# Patient Record
Sex: Male | Born: 1955 | Race: White | Hispanic: No | Marital: Married | Smoking: Never smoker
Health system: Southern US, Community
[De-identification: ages and names within clinical notes are randomized; demographics above are authoritative.]

## PROBLEM LIST (undated history)

## (undated) DIAGNOSIS — C801 Malignant (primary) neoplasm, unspecified: Secondary | ICD-10-CM

## (undated) DIAGNOSIS — E7800 Pure hypercholesterolemia, unspecified: Secondary | ICD-10-CM

## (undated) DIAGNOSIS — I10 Essential (primary) hypertension: Secondary | ICD-10-CM

## (undated) DIAGNOSIS — E669 Obesity, unspecified: Secondary | ICD-10-CM

## (undated) DIAGNOSIS — G4730 Sleep apnea, unspecified: Secondary | ICD-10-CM

## (undated) DIAGNOSIS — C8590 Non-Hodgkin lymphoma, unspecified, unspecified site: Secondary | ICD-10-CM

## (undated) HISTORY — PX: HIP SURGERY: SHX245

---

## 2017-07-29 ENCOUNTER — Emergency Department (HOSPITAL_BASED_OUTPATIENT_CLINIC_OR_DEPARTMENT_OTHER)
Admission: EM | Admit: 2017-07-29 | Discharge: 2017-07-29 | Disposition: A | Discharge status: Home / Self Care | Payer: BLUE CROSS/BLUE SHIELD | Attending: Emergency Medicine | Admitting: Emergency Medicine

## 2017-07-29 ENCOUNTER — Other Ambulatory Visit: Payer: Self-pay

## 2017-07-29 ENCOUNTER — Emergency Department (HOSPITAL_BASED_OUTPATIENT_CLINIC_OR_DEPARTMENT_OTHER): Payer: BLUE CROSS/BLUE SHIELD

## 2017-07-29 ENCOUNTER — Encounter (HOSPITAL_BASED_OUTPATIENT_CLINIC_OR_DEPARTMENT_OTHER): Payer: Self-pay | Admitting: *Deleted

## 2017-07-29 DIAGNOSIS — Y9339 Activity, other involving climbing, rappelling and jumping off: Secondary | ICD-10-CM | POA: Insufficient documentation

## 2017-07-29 DIAGNOSIS — Z79899 Other long term (current) drug therapy: Secondary | ICD-10-CM | POA: Diagnosis not present

## 2017-07-29 DIAGNOSIS — S63259A Unspecified dislocation of unspecified finger, initial encounter: Secondary | ICD-10-CM

## 2017-07-29 DIAGNOSIS — S62663A Nondisplaced fracture of distal phalanx of left middle finger, initial encounter for closed fracture: Secondary | ICD-10-CM | POA: Diagnosis not present

## 2017-07-29 DIAGNOSIS — Y929 Unspecified place or not applicable: Secondary | ICD-10-CM | POA: Insufficient documentation

## 2017-07-29 DIAGNOSIS — S62253A Displaced fracture of neck of first metacarpal bone, unspecified hand, initial encounter for closed fracture: Secondary | ICD-10-CM | POA: Diagnosis not present

## 2017-07-29 DIAGNOSIS — I10 Essential (primary) hypertension: Secondary | ICD-10-CM | POA: Diagnosis not present

## 2017-07-29 DIAGNOSIS — S6992XA Unspecified injury of left wrist, hand and finger(s), initial encounter: Secondary | ICD-10-CM | POA: Diagnosis present

## 2017-07-29 DIAGNOSIS — Y999 Unspecified external cause status: Secondary | ICD-10-CM | POA: Insufficient documentation

## 2017-07-29 DIAGNOSIS — Z859 Personal history of malignant neoplasm, unspecified: Secondary | ICD-10-CM | POA: Insufficient documentation

## 2017-07-29 DIAGNOSIS — W228XXA Striking against or struck by other objects, initial encounter: Secondary | ICD-10-CM | POA: Insufficient documentation

## 2017-07-29 HISTORY — DX: Malignant (primary) neoplasm, unspecified: C80.1

## 2017-07-29 HISTORY — DX: Essential (primary) hypertension: I10

## 2017-07-29 MED ORDER — BUPIVACAINE HCL 0.5 % IJ SOLN
INTRAMUSCULAR | Status: AC
  Administered 2017-07-29: 50 mL
  Filled 2017-07-29: qty 1

## 2017-07-29 MED ORDER — CEPHALEXIN 500 MG PO CAPS: 500.0000 mg | ORAL_CAPSULE | Freq: Four times a day (QID) | ORAL | 0 refills | Status: DC

## 2017-07-29 MED ORDER — HYDROCODONE-ACETAMINOPHEN 5-325 MG PO TABS: 1.0000 | ORAL_TABLET | ORAL | 0 refills | Status: DC | PRN

## 2017-07-29 MED ORDER — BUPIVACAINE HCL 0.5 % IJ SOLN
50.0000 mL | Freq: Once | INTRAMUSCULAR | Status: AC
  Administered 2017-07-29: 50 mL
  Filled 2017-07-29: qty 1

## 2017-07-29 MED FILL — HYDROCODON-APAP 5-325: 5-325 | 2 days supply | Qty: 6 | Fill #0

## 2017-07-29 MED FILL — CEPHALEXIN 500 MG CAPSULE: 500 | 5 days supply | Qty: 20 | Fill #0

## 2017-07-29 NOTE — ED Provider Notes (Addendum)
[facility]ERG[facility] DEPARTMENT Provider Note   CSN: [id number] Arrival date & time: 07/29/17  1445     History   Chief Complaint Chief Complaint  Patient presents with  . Fall  . Finger Injury    HPI Michael Wolf is a 62 y.o. male. CC:  Finger injury.  HPI:  62 year old male. 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 The metal railing latter was slick and he slipped. He does not know if he caught his finger on something on the way down or struck it against the ground. His left finger injury with deformity. Has laceration on the volar aspect some pain distally in the deformity of his IP joint. Struck his head has abrasion. No loss of consciousness. Not anticoagulated. No neck or back pain. No neurological symptoms. Not repetitive.  Past Medical History:  Diagnosis Date  . Cancer ([place])   . Hypertension     There are no active problems to display for this patient.   History reviewed. No pertinent surgical history.     Home Medications    Prior to Admission medications   Medication Sig Start Date End Date Taking? Authorizing Provider  Ezetimibe-Simvastatin (VYTORIN PO) Take by mouth.   Yes [provider]  TRIAMTERENE-HCTZ PO Take by mouth.   Yes [provider]    Family History No family history on file.  Social History Social History   Tobacco Use  . Smoking status: Never Smoker  . Smokeless tobacco: Never Used  Substance Use Topics  . Alcohol use: No    Frequency: Never  . Drug use: No     Allergies   Patient has no known allergies.   Review of Systems Review of Systems  Constitutional: Negative for appetite change, chills, diaphoresis, fatigue and fever.  HENT: Negative for mouth sores, sore throat and trouble swallowing.        Right temple abrasion  Eyes: Negative for visual disturbance.  Respiratory: Negative for cough, chest tightness, shortness of breath and wheezing.   Cardiovascular: Negative  for chest pain.  Gastrointestinal: Negative for abdominal distention, abdominal pain, diarrhea, nausea and vomiting.  Endocrine: Negative for polydipsia, polyphagia and polyuria.  Genitourinary: Negative for dysuria, frequency and hematuria.  Musculoskeletal: Negative for gait problem.       Finger injury  Skin: Negative for color change, pallor and rash.  Neurological: Negative for dizziness, syncope, light-headedness and headaches.  Hematological: Does not bruise/bleed easily.  Psychiatric/Behavioral: Negative for behavioral problems and confusion.     Physical Exam Updated Vital Signs BP 137/87   Pulse (!) 104   Temp 98.1 F (36.7 C) (Oral)   Resp 18   Ht 6' (1.829 m)   Wt (!) 145.2 kg (320 lb)   SpO2 95%   BMI 43.40 kg/m   Physical Exam  Constitutional: He is oriented to person, place, and time. He appears well-developed and well-nourished. No distress.  HENT:  Head: Normocephalic.  Eyes: Conjunctivae are normal. Pupils are equal, round, and reactive to light. No scleral icterus.  Neck: Normal range of motion. Neck supple. No thyromegaly present.  Cardiovascular: Normal rate and regular rhythm. Exam reveals no gallop and no friction rub.  No murmur heard. Pulmonary/Chest: Effort normal and breath sounds normal. No respiratory distress. He has no wheezes. He has no rales.  Abdominal: Soft. Bowel sounds are normal. He exhibits no distension. There is no tenderness. There is no rebound.  Musculoskeletal: Normal range of motion.  43mm laceration over volar PIP to LIF. FDS and FDP tendon function intact. Normal sensation Nail intact.  Neurological: He is alert and oriented to person, place, and time.  Skin: Skin is warm and dry. No rash noted.  Psychiatric: He has a normal mood and affect. His behavior is normal.     ED Treatments / Results  Labs (all labs ordered are listed, but only abnormal results are displayed) Labs Reviewed - No data to display  EKG  EKG  Interpretation None       Radiology Dg Finger Middle Left  Result Date: 07/29/2017 CLINICAL DATA:  Status post reduction of dislocation of the PIP joint of the right long finger. Initial encounter. EXAM: LEFT MIDDLE FINGER 2+V COMPARISON:  Plain films right long finger earlier today. FINDINGS: Dorsal dislocation of the PIP joint has been reduced. No new abnormality. 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 IMPRESSION: Successful reduction of dislocation of the PIP joint. No new abnormality. Electronically Signed   By: Inge Rise M.D.   On: 07/29/2017 16:21   Dg Finger Middle Left  Result Date: 07/29/2017 CLINICAL DATA:  Status post fall today landing on the left middle finger. EXAM: LEFT MIDDLE FINGER 2+V COMPARISON:  None in PACs FINDINGS: The patient has sustained dislocation at the PIP joint with the middle phalanx lying dorsal to the head of the proximal phalanx. There is shortening of the digit by approximately 8 mm. The DIP joint exhibits degenerative change but no definite acute fracture. However, there is a fracture through the tuft of the distal phalanx. There is mild degenerative change of the third MCP joint. A metallic foreign body projects along the ventral aspect of the soft tissues superficial to the head of the third metacarpal. IMPRESSION: Acute dislocation at the PIP joint with dorsal and ulnar displacement of the middle phalanx with respect to the head of the proximal phalanx. Comminuted and mildly distracted tuft fracture of the distal phalanx of the third finger. Degenerative changes of the third MCP joint and the DIP joint of the third finger. Electronically Signed   By: David  Martinique M.D.   On: 07/29/2017 15:16    Procedures Procedures (including critical care time)  Medications Ordered in ED Medications  bupivacaine (MARCAINE) 0.5 % (with pres) injection 50 mL (50 mLs  Infiltration Given 07/29/17 1544)     Initial Impression / Assessment and Plan / ED Course  I have reviewed the triage vital signs and the nursing notes.  Pertinent labs & imaging results that were available during my care of the patient were reviewed by me and considered in my medical decision making (see chart for details).   x-ray show distal phalanx fracture. He has dorsal PIP dislocation.  Finger was anesthetized with 0.5% Marcaine and reduced. Reduction of dislocation Date/Time: 4:31 PM Performed by: Lolita Patella Authorized by: Lolita Patella Consent: Verbal consent obtained. Risks and benefits: risks, benefits and alternatives were discussed Consent given by: patient Required items: required blood products, implants, devices, and special equipment available Time out: Immediately prior to procedure a "time out" was called to verify the correct patient, procedure, equipment, support staff and site/side marked as required.  Patient sedated: No  Vitals: Vital signs were monitored during sedation. Patient tolerance: Patient tolerated the procedure well with no immediate complications. Joint: LIF PIP Reduction technique: volar traction of distal, flexion.    Postreduction  x-ray show excellent reduction. No fractures of the distal pelvis fracture. We'll place him on antibiotics. Had surgical follow-up.  Patient winced finger under tap water for 5 minutes prior to reaction I had a re-irrigated with tap water afterwards. This is a 5 mm proximal medical laceration. Doubtful open joint as his tendons are intact. However will leave open as it is self approximating. Antibiotic ointment and gauze applied. Use antibiotic prophylaxis.   Final Clinical Impressions(s) / ED Diagnoses   Final diagnoses:  Dislocation of finger, initial encounter  Closed nondisplaced fracture of distal phalanx of left middle finger, initial encounter    ED Discharge Orders    None       Tanna Furry, MD 07/29/17 1631    Tanna Furry, MD 07/29/17 1635

## 2017-07-29 NOTE — ED Notes (Signed)
ED Provider at bedside. 

## 2017-07-29 NOTE — ED Notes (Signed)
ED Provider at bedside giving detailed discharge instructions.

## 2017-07-29 NOTE — Discharge Instructions (Signed)
Keep gauze dressing around laceration until healed. Change dressing daily Antibiotic as directed for 5 days. Call Dr. Fredna Dow, hand surgery, for recheck appointment.

## 2017-07-29 NOTE — ED Triage Notes (Signed)
He slipped on some oil and fell. Injury to his left middle finger. Deformity noted with puncture noted.

## 2019-08-05 ENCOUNTER — Ambulatory Visit: Payer: Medicare Other | Attending: Internal Medicine

## 2019-08-05 DIAGNOSIS — Z23 Encounter for immunization: Secondary | ICD-10-CM

## 2019-08-05 NOTE — Progress Notes (Signed)
   Covid-19 Vaccination Clinic  Name:  Michael Wolf    MRN: [id number] DOB: 1955/11/19  08/05/2019  Mr. [name] was observed post Covid-19 immunization for 15 minutes without incidence. He was provided with Vaccine Information Sheet and instruction to access the V-Safe system.   Mr. [name] was instructed to call 911 with any severe reactions post vaccine: Marland Kitchen Difficulty breathing  . Swelling of your face and throat  . A fast heartbeat  . A bad rash all over your body  . Dizziness and weakness    Immunizations Administered    Name Date Dose VIS Date Route   Pfizer COVID-19 Vaccine 08/05/2019  9:10 AM 0.3 mL 06/25/2019 Intramuscular   Manufacturer: [place]   Lot: [id number]   [place]: [id number]

## 2019-08-26 ENCOUNTER — Ambulatory Visit: Payer: Medicare Other | Attending: Internal Medicine

## 2019-08-26 DIAGNOSIS — Z23 Encounter for immunization: Secondary | ICD-10-CM | POA: Insufficient documentation

## 2019-08-26 NOTE — Progress Notes (Signed)
   Covid-19 Vaccination Clinic  Name:  Birney Salud    MRN: [id number] DOB: 1956/05/24  08/26/2019  Mr. Dupell was observed post Covid-19 immunization for 15 minutes without incidence. He was provided with Vaccine Information Sheet and instruction to access the V-Safe system.   Mr. Derousse was instructed to call 911 with any severe reactions post vaccine: Marland Kitchen Difficulty breathing  . Swelling of your face and throat  . A fast heartbeat  . A bad rash all over your body  . Dizziness and weakness    Immunizations Administered    Name Date Dose VIS Date Route   Pfizer COVID-19 Vaccine 08/26/2019  9:12 AM 0.3 mL 06/25/2019 Intramuscular   Manufacturer: [place]   Lot: [id number]   [place]: [id number]

## 2019-12-01 ENCOUNTER — Emergency Department (HOSPITAL_BASED_OUTPATIENT_CLINIC_OR_DEPARTMENT_OTHER): Payer: Medicare Other

## 2019-12-01 ENCOUNTER — Other Ambulatory Visit: Payer: Self-pay

## 2019-12-01 ENCOUNTER — Encounter (HOSPITAL_BASED_OUTPATIENT_CLINIC_OR_DEPARTMENT_OTHER): Payer: Self-pay | Admitting: Emergency Medicine

## 2019-12-01 ENCOUNTER — Emergency Department (HOSPITAL_BASED_OUTPATIENT_CLINIC_OR_DEPARTMENT_OTHER)
Admission: EM | Admit: 2019-12-01 | Discharge: 2019-12-01 | Disposition: A | Discharge status: Home / Self Care | Payer: Medicare Other | Attending: Emergency Medicine | Admitting: Emergency Medicine

## 2019-12-01 DIAGNOSIS — W309XXA Contact with unspecified agricultural machinery, initial encounter: Secondary | ICD-10-CM | POA: Diagnosis not present

## 2019-12-01 DIAGNOSIS — Y999 Unspecified external cause status: Secondary | ICD-10-CM | POA: Insufficient documentation

## 2019-12-01 DIAGNOSIS — S62524B Nondisplaced fracture of distal phalanx of right thumb, initial encounter for open fracture: Secondary | ICD-10-CM | POA: Insufficient documentation

## 2019-12-01 DIAGNOSIS — S62521B Displaced fracture of distal phalanx of right thumb, initial encounter for open fracture: Secondary | ICD-10-CM

## 2019-12-01 DIAGNOSIS — Y9389 Activity, other specified: Secondary | ICD-10-CM | POA: Insufficient documentation

## 2019-12-01 DIAGNOSIS — S6701XA Crushing injury of right thumb, initial encounter: Secondary | ICD-10-CM | POA: Diagnosis present

## 2019-12-01 DIAGNOSIS — S61111A Laceration without foreign body of right thumb with damage to nail, initial encounter: Secondary | ICD-10-CM | POA: Diagnosis not present

## 2019-12-01 DIAGNOSIS — Y9279 Other farm location as the place of occurrence of the external cause: Secondary | ICD-10-CM | POA: Insufficient documentation

## 2019-12-01 DIAGNOSIS — Z23 Encounter for immunization: Secondary | ICD-10-CM | POA: Insufficient documentation

## 2019-12-01 HISTORY — DX: Sleep apnea, unspecified: G47.30

## 2019-12-01 HISTORY — DX: Obesity, unspecified: E66.9

## 2019-12-01 HISTORY — DX: Pure hypercholesterolemia, unspecified: E78.00

## 2019-12-01 HISTORY — DX: Non-Hodgkin lymphoma, unspecified, unspecified site: C85.90

## 2019-12-01 MED ORDER — TETANUS-DIPHTH-ACELL PERTUSSIS 5-2.5-18.5 LF-MCG/0.5 IM SUSP
0.5000 mL | Freq: Once | INTRAMUSCULAR | Status: AC
  Administered 2019-12-01: 0.5 mL via INTRAMUSCULAR
  Filled 2019-12-01: qty 0.5

## 2019-12-01 MED ORDER — LIDOCAINE HCL (PF) 1 % IJ SOLN
INTRAMUSCULAR | Status: AC
  Filled 2019-12-01: qty 5

## 2019-12-01 MED ORDER — HYDROCODONE-ACETAMINOPHEN 5-325 MG PO TABS: 1.0000 | ORAL_TABLET | Freq: Four times a day (QID) | ORAL | 0 refills | Status: DC | PRN

## 2019-12-01 MED ORDER — CEFAZOLIN SODIUM-DEXTROSE 2-4 GM/100ML-% IV SOLN
2.0000 g | Freq: Once | INTRAVENOUS | Status: AC
  Administered 2019-12-01: 2 g via INTRAVENOUS
  Filled 2019-12-01: qty 100

## 2019-12-01 MED ORDER — HYDROMORPHONE HCL 1 MG/ML IJ SOLN
0.5000 mg | Freq: Once | INTRAMUSCULAR | Status: AC
Start: 2019-12-01 — End: 2019-12-01
  Administered 2019-12-01: 0.5 mg via INTRAVENOUS
  Filled 2019-12-01: qty 1

## 2019-12-01 MED ORDER — LIDOCAINE HCL (PF) 1 % IJ SOLN
5.0000 mL | Freq: Once | INTRAMUSCULAR | Status: AC
  Administered 2019-12-01: 5 mL
  Filled 2019-12-01: qty 5

## 2019-12-01 MED ORDER — CEPHALEXIN 500 MG PO CAPS: 500.0000 mg | ORAL_CAPSULE | Freq: Three times a day (TID) | ORAL | 0 refills | Status: AC

## 2019-12-01 MED ORDER — SODIUM CHLORIDE 0.9 % IV SOLN
INTRAVENOUS | Status: DC | PRN
  Administered 2019-12-01: 250 mL via INTRAVENOUS

## 2019-12-01 NOTE — ED Provider Notes (Signed)
[facility] EMERGENCY DEPARTMENT Provider Note   CSN: [id number] Arrival date & time: 12/01/19  1400     History Chief Complaint  Patient presents with  . Finger Injury    Michael Wolf is a 64 y.o. male history of high cholesterol, hypertension, obesity.  Previous history of non-Hodgkin's lymphoma currently in remission.  Patient presents today for injury of the right thumb just prior to arrival.  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  Reports pain was initially moderate intensity sharp constant nonradiating worsened with pressure, pain is gradually improved with time without intervention.  Bleeding was stopped right away with direct pressure.  He denies any other injuries today no head injury, loss of consciousness, neck pain, back pain, chest pain, abdominal pain, nausea/vomiting, recent illness or any additional concerns.  Patient reports Tdap is out of date. HPI     Past Medical History:  Diagnosis Date  . Cancer ([place])   . High cholesterol   . Hypertension   . Non Hodgkin's lymphoma ([place])   . Obesity   . Sleep apnea     There are no problems to display for this patient.   Past Surgical History:  Procedure Laterality Date  . HIP SURGERY         No family history on file.  Social History   Tobacco Use  . Smoking status: Never Smoker  . Smokeless tobacco: Never Used  Substance Use Topics  . Alcohol use: No  . Drug use: No    Home Medications Prior to Admission medications   Medication Sig Start Date End Date Taking? Authorizing Provider  cephALEXin (KEFLEX) 500 MG capsule Take 1 capsule (500 mg total) by mouth 3 (three) times daily for 5 days. 12/01/19 12/06/19  Nuala Alpha A, PA-C  Ezetimibe-Simvastatin (VYTORIN PO) Take by mouth.    [provider]  HYDROcodone-acetaminophen (NORCO/VICODIN) 5-325 MG tablet Take 1-2 tablets by mouth  every 6 (six) hours as needed for severe pain. 12/01/19   Hayden Rasmussen, MD  TRIAMTERENE-HCTZ PO Take by mouth.    [provider]    Allergies    Patient has no known allergies.  Review of Systems   Review of Systems Ten systems are reviewed and are negative for acute change except as noted in the HPI  Physical Exam Updated Vital Signs BP 120/72 (BP Location: Right Arm)   Pulse 78   Temp 98 F (36.7 C) (Oral)   Resp 20   Ht 6' (1.829 m)   Wt (!) 149.5 kg   SpO2 98%   BMI 44.69 kg/m   Physical Exam Constitutional:      General: He is not in acute distress.    Appearance: Normal appearance. He is well-developed. He is not ill-appearing or diaphoretic.  HENT:     Head: Normocephalic and atraumatic.     Right Ear: External ear normal.     Left Ear: External ear normal.     Nose: Nose normal.  Eyes:     General: Vision grossly intact. Gaze aligned appropriately.     Pupils: Pupils are equal, round, and reactive to light.  Neck:     Trachea: Trachea and phonation normal. No tracheal deviation.  Pulmonary:     Effort: Pulmonary effort is normal. No respiratory distress.  Abdominal:     General: There is no distension.     Palpations:  Abdomen is soft.     Tenderness: There is no abdominal tenderness. There is no guarding or rebound.  Musculoskeletal:        General: Normal range of motion.     Cervical back: Normal range of motion.     Comments: Right  hand: Severe laceration of the right thumb extending from the ulnar edge of the proximal thumbnail completely through the nailbed and extending down through the radial side and across halfway in the pad, bone visible proximally along with nail matrix, no obvious arterial bleeding.  Distal portion is angulated towards the ulnar side and inferiorly towards palm.  Sensation diminished but intact distally.  Otherwise fingers and hand appear normal, probe range of motion strength with all other movements including at the  wrist.  Strong equal radial pulses, compartments soft.  Skin:    General: Skin is warm and dry.  Neurological:     Mental Status: He is alert.     GCS: GCS eye subscore is 4. GCS verbal subscore is 5. GCS motor subscore is 6.     Comments: Speech is clear and goal oriented, follows commands Major Cranial nerves without deficit, no facial droop Moves extremities without ataxia, coordination intact  Psychiatric:        Behavior: Behavior normal.     ED Results / Procedures / Treatments   Labs (all labs ordered are listed, but only abnormal results are displayed) Labs Reviewed - No data to display  EKG None  Radiology DG Finger Thumb Right  Result Date: 12/01/2019 CLINICAL DATA:  Laceration of the right thumb EXAM: RIGHT THUMB 2+V COMPARISON:  None. FINDINGS: Laceration of the dorsal aspect of the right thumb. Comminuted fracture through the tuft of the first distal phalanx. Moderate osteoarthritis of the first CMC joint, first MCP joint and first IP joint. No other fracture. No dislocation. IMPRESSION: Laceration of the dorsal aspect of the right thumb. Comminuted fracture through the tuft of the first distal phalanx. Electronically Signed   By: Kathreen Devoid   On: 12/01/2019 14:58    Procedures .Marland KitchenLaceration Repair  Date/Time: 12/01/2019 5:59 PM Performed by: Deliah Boston, PA-C Authorized by: Deliah Boston, PA-C   Consent:    Consent obtained:  Verbal   Consent given by:  Patient   Risks discussed:  Infection, need for additional repair, nerve damage, poor wound healing, poor cosmetic result, pain, retained foreign body, tendon damage and vascular damage Anesthesia (see MAR for exact dosages):    Anesthesia method:  Nerve block   Block location:  Right thumb   Block needle gauge:  25 G   Block anesthetic:  Lidocaine 1% w/o epi   Block technique:  Digital   Block injection procedure:  Anatomic landmarks identified, introduced needle, incremental injection, negative  aspiration for blood and anatomic landmarks palpated   Block outcome:  Anesthesia achieved Laceration details:    Location:  Finger   Finger location:  R thumb   Length (cm):  3.5   Depth (mm):  10 Repair type:    Repair type:  Intermediate Pre-procedure details:    Preparation:  Patient was prepped and draped in usual sterile fashion and imaging obtained to evaluate for foreign bodies Exploration:    Hemostasis achieved with:  Direct pressure   Wound exploration: wound explored through full range of motion and entire depth of wound probed and visualized     Wound extent: underlying fracture     Wound extent: no foreign bodies/material noted  Contaminated: no   Treatment:    Area cleansed with:  Saline, Shur-Clens and Betadine   Amount of cleaning:  Extensive   Irrigation solution:  Sterile saline   Irrigation volume:  1L   Irrigation method:  Pressure wash Skin repair:    Repair method:  Sutures   Suture size:  3-0   Suture material:  Prolene   Suture technique:  Simple interrupted   Number of sutures:  3 Post-procedure details:    Dressing:  Non-adherent dressing and sterile dressing   Patient tolerance of procedure:  Tolerated well, no immediate complications Comments:     Nonstick gauze, splint and dressing applied by nursing staff.   (including critical care time)  Medications Ordered in ED Medications  0.9 %  sodium chloride infusion (250 mLs Intravenous New Bag/Given 12/01/19 1654)  ceFAZolin (ANCEF) IVPB 2g/100 mL premix (0 g Intravenous Stopped 12/01/19 1804)  Tdap (BOOSTRIX) injection 0.5 mL (0.5 mLs Intramuscular Given 12/01/19 1634)  HYDROmorphone (DILAUDID) injection 0.5 mg (0.5 mg Intravenous Given 12/01/19 1649)  lidocaine (PF) (XYLOCAINE) 1 % injection 5 mL (5 mLs Infiltration Given by Other 12/01/19 1656)  lidocaine (PF) (XYLOCAINE) 1 % injection (  Given 12/01/19 1805)  lidocaine (PF) (XYLOCAINE) 1 % injection (  Given 12/01/19 [id number])    ED Course  I have  reviewed the triage vital signs and the nursing notes.  Pertinent labs & imaging results that were available during my care of the patient were reviewed by me and considered in my medical decision making (see chart for details).  Clinical Course as of Nov 30 1805  Wed Dec 01, 2019  1632 Dr. Caralyn Guile   [BM]  3332 64 year old male here with a crush injury to right thumb.  He has laceration through the nail and nailbed.  X-rays show a fracture.  Reviewed with hand surgery on-call who recommends some tach suturing and splint.  Antibiotics.  Follow-up in the office.   [MB]    Clinical Course User Index [BM] Deliah Boston, PA-C [MB] Hayden Rasmussen, MD   MDM Rules/Calculators/A&P                     64 year old male presents with right thumb injury that occurred just prior to arrival and got caught in some farming equipment.  Bleeding controlled with direct pressure.  X-ray obtained in triage below.  DG Right Thumb:  IMPRESSION:  Laceration of the dorsal aspect of the right thumb. Comminuted  fracture through the tuft of the first distal phalanx  I have personally reviewed patient's thumb x-ray and agree with radiologist interpretation. - Patient has an open fracture, 2 g Ancef ordered.  Dilaudid ordered for pain.  Tdap updated.  Consult placed to hand surgery to discuss possible transfer. - 4:32 PM: I discussed the case with hand surgeon Dr. Caralyn Guile, Dr. Caralyn Guile advises that we tacked down patient's thumb and place it in a splint and sent him to the office tomorrow for further evaluation. - I advised patient of hand surgery recommendations and he is agreeable.  Laceration was repaired as above.  I was able to use 3 sutures to tack down the thumb, the thumb had been angulated towards the ulnar side, after repair it is more aligned than prior still has obvious bony deformity present.  As per hand surgeon recommendations nonstick gauze was applied and patient was placed in splint.  He  reports this was comfortable.  He will call the hand surgeon Dr.  Ortmann tomorrow morning to schedule a follow-up appointment.  Patient was prescribed Keflex for infection prophylaxis.  Additionally 6 pills Norco were prescribed for acute pain associated with open fracture, patient states understanding of narcotic precautions and has no questions, his wife is here to drive home.  Norco was prescribed by Dr. Melina Copa as my Imprivata is malfunctioning.  At this time there does not appear to be any evidence of an acute emergency medical condition and the patient appears stable for discharge with appropriate outpatient follow up. Diagnosis was discussed with patient who verbalizes understanding of care plan and is agreeable to discharge. I have discussed return precautions with patient and wife who verbalizes understanding. Patient encouraged see Dr. Caralyn Guile tomorrow morning. All questions answered.  Patient was seen and evaluated by Dr. Melina Copa during this visit who agrees with repair, discharge and and surgery recommendations.  Note: Portions of this report may have been transcribed using voice recognition software. Every effort was made to ensure accuracy; however, inadvertent computerized transcription errors may still be present. Final Clinical Impression(s) / ED Diagnoses Final diagnoses:  Open fracture of tuft of distal phalanx of right thumb    Rx / DC Orders ED Discharge Orders         Ordered    cephALEXin (KEFLEX) 500 MG capsule  3 times daily     12/01/19 1749    HYDROcodone-acetaminophen (NORCO/VICODIN) 5-325 MG tablet  Every 6 hours PRN     12/01/19 1750           Gari Crown 12/01/19 1808    Hayden Rasmussen, MD 12/02/19 [phone]

## 2019-12-01 NOTE — Discharge Instructions (Addendum)
At this time there does not appear to be the presence of an emergent medical condition, however there is always the potential for conditions to change. Please read and follow the below instructions.  Please return to the Emergency Department immediately for any new or worsening symptoms. Please be sure to follow up with your Primary Care Provider within one week regarding your visit today; please call their office to schedule an appointment even if you are feeling better for a follow-up visit. You may take the medication Norco (Hydrocodone/Acetaminophen) as prescribed to help with severe pain.  This medication will make you drowsy so do not drive, drink alcohol, take other sedating medications or perform any dangerous activities such as driving after taking Norco. Norco contains Tylenol (acetaminophen) so do not take any other Tylenol-containing products with Norco. Please take your antibiotic Keflex as prescribed to help avoid infection. Please drink enough water to avoid dehydration and get plenty of rest. Call the hand surgeon Dr. Caralyn Guile first thing tomorrow morning to schedule a follow-up appointment.  He would like to see you in his office tomorrow, your address and his phone number are both on your discharge paperwork.  You will need to see Dr. Caralyn Guile for surgery.  Do not remove your nonstick gauze or your splint until you are seen by Dr. Caralyn Guile tomorrow.  Get help right away if: Your finger gets numb or turns blue. You develop fever or chills You have any new/concerning or worsening of symptoms.  Please read the additional information packets attached to your discharge summary.  Do not take your medicine if  develop an itchy rash, swelling in your mouth or lips, or difficulty breathing; call 911 and seek immediate emergency medical attention if this occurs.  Note: Portions of this text may have been transcribed using voice recognition software. Every effort was made to ensure accuracy;  however, inadvertent computerized transcription errors may still be present.

## 2019-12-01 NOTE — ED Triage Notes (Addendum)
Deep laceration to right thumb on piece of equipment just PTA.  No UDS needed. Pt works for himself.

## 2019-12-01 NOTE — ED Notes (Signed)
MD aware  Of xray results

## 2020-03-03 ENCOUNTER — Encounter (HOSPITAL_BASED_OUTPATIENT_CLINIC_OR_DEPARTMENT_OTHER): Payer: Self-pay

## 2020-03-03 ENCOUNTER — Other Ambulatory Visit: Payer: Self-pay

## 2020-03-03 ENCOUNTER — Emergency Department (HOSPITAL_BASED_OUTPATIENT_CLINIC_OR_DEPARTMENT_OTHER): Payer: Medicare Other

## 2020-03-03 ENCOUNTER — Inpatient Hospital Stay (HOSPITAL_BASED_OUTPATIENT_CLINIC_OR_DEPARTMENT_OTHER)
Admission: EM | Admit: 2020-03-03 | Discharge: 2020-03-09 | DRG: 177 | Disposition: A | Discharge status: Home Health | Payer: Medicare Other | Attending: Internal Medicine | Admitting: Internal Medicine

## 2020-03-03 DIAGNOSIS — U071 COVID-19: Principal | ICD-10-CM | POA: Diagnosis present

## 2020-03-03 DIAGNOSIS — E871 Hypo-osmolality and hyponatremia: Secondary | ICD-10-CM | POA: Diagnosis present

## 2020-03-03 DIAGNOSIS — E860 Dehydration: Secondary | ICD-10-CM | POA: Diagnosis present

## 2020-03-03 DIAGNOSIS — E7800 Pure hypercholesterolemia, unspecified: Secondary | ICD-10-CM | POA: Diagnosis present

## 2020-03-03 DIAGNOSIS — R0602 Shortness of breath: Secondary | ICD-10-CM

## 2020-03-03 DIAGNOSIS — Z79899 Other long term (current) drug therapy: Secondary | ICD-10-CM

## 2020-03-03 DIAGNOSIS — J9600 Acute respiratory failure, unspecified whether with hypoxia or hypercapnia: Secondary | ICD-10-CM | POA: Diagnosis not present

## 2020-03-03 DIAGNOSIS — J189 Pneumonia, unspecified organism: Secondary | ICD-10-CM

## 2020-03-03 DIAGNOSIS — I959 Hypotension, unspecified: Secondary | ICD-10-CM | POA: Diagnosis present

## 2020-03-03 DIAGNOSIS — E785 Hyperlipidemia, unspecified: Secondary | ICD-10-CM | POA: Diagnosis present

## 2020-03-03 DIAGNOSIS — C8590 Non-Hodgkin lymphoma, unspecified, unspecified site: Secondary | ICD-10-CM | POA: Diagnosis present

## 2020-03-03 DIAGNOSIS — E1165 Type 2 diabetes mellitus with hyperglycemia: Secondary | ICD-10-CM | POA: Diagnosis not present

## 2020-03-03 DIAGNOSIS — J9601 Acute respiratory failure with hypoxia: Secondary | ICD-10-CM | POA: Diagnosis present

## 2020-03-03 DIAGNOSIS — Z6841 Body Mass Index (BMI) 40.0 and over, adult: Secondary | ICD-10-CM

## 2020-03-03 DIAGNOSIS — Y92239 Unspecified place in hospital as the place of occurrence of the external cause: Secondary | ICD-10-CM | POA: Diagnosis not present

## 2020-03-03 DIAGNOSIS — T380X5A Adverse effect of glucocorticoids and synthetic analogues, initial encounter: Secondary | ICD-10-CM | POA: Diagnosis not present

## 2020-03-03 DIAGNOSIS — G4730 Sleep apnea, unspecified: Secondary | ICD-10-CM | POA: Diagnosis present

## 2020-03-03 DIAGNOSIS — E876 Hypokalemia: Secondary | ICD-10-CM | POA: Diagnosis present

## 2020-03-03 DIAGNOSIS — J1282 Pneumonia due to coronavirus disease 2019: Secondary | ICD-10-CM | POA: Diagnosis present

## 2020-03-03 DIAGNOSIS — I10 Essential (primary) hypertension: Secondary | ICD-10-CM | POA: Diagnosis present

## 2020-03-03 LAB — COMPREHENSIVE METABOLIC PANEL
ALT: 65 U/L — ABNORMAL HIGH (ref 0–44)
AST: 97 U/L — ABNORMAL HIGH (ref 15–41)
Albumin: 2.9 g/dL — ABNORMAL LOW (ref 3.5–5.0)
Alkaline Phosphatase: 56 U/L (ref 38–126)
Anion gap: 12 (ref 5–15)
BUN: 17 mg/dL (ref 8–23)
CO2: 25 mmol/L (ref 22–32)
Calcium: 7.9 mg/dL — ABNORMAL LOW (ref 8.9–10.3)
Chloride: 96 mmol/L — ABNORMAL LOW (ref 98–111)
Creatinine, Ser: 1.11 mg/dL (ref 0.61–1.24)
GFR calc Af Amer: >60 mL/min (ref >60)
GFR calc non Af Amer: >60 mL/min (ref >60)
Glucose, Bld: 111 mg/dL — ABNORMAL HIGH (ref 70–99)
Potassium: 3.3 mmol/L — ABNORMAL LOW (ref 3.5–5.1)
Sodium: 133 mmol/L — ABNORMAL LOW (ref 135–145)
Total Bilirubin: 0.5 mg/dL (ref 0.3–1.2)
Total Protein: 6.4 g/dL — ABNORMAL LOW (ref 6.5–8.1)

## 2020-03-03 LAB — FIBRINOGEN: Fibrinogen: 661 mg/dL — ABNORMAL HIGH (ref 210–475)

## 2020-03-03 LAB — TROPONIN I (HIGH SENSITIVITY): Troponin I (High Sensitivity): 15 ng/L (ref <18)

## 2020-03-03 LAB — SARS CORONAVIRUS 2 BY RT PCR (HOSPITAL ORDER, PERFORMED IN ~~LOC~~ HOSPITAL LAB): SARS Coronavirus 2: POSITIVE — AB

## 2020-03-03 LAB — CBC WITH DIFFERENTIAL/PLATELET
Abs Immature Granulocytes: 0.03 10*3/uL (ref 0.00–0.07)
Basophils Absolute: 0 10*3/uL (ref 0.0–0.1)
Basophils Relative: 0 %
Eosinophils Absolute: 0 10*3/uL (ref 0.0–0.5)
Eosinophils Relative: 0 %
HCT: 41.7 % (ref 39.0–52.0)
Hemoglobin: 13.7 g/dL (ref 13.0–17.0)
Immature Granulocytes: 1 %
Lymphocytes Relative: 4 %
Lymphs Abs: 0.2 10*3/uL — ABNORMAL LOW (ref 0.7–4.0)
MCH: 29.3 pg (ref 26.0–34.0)
MCHC: 32.9 g/dL (ref 30.0–36.0)
MCV: 89.1 fL (ref 80.0–100.0)
Monocytes Absolute: 0.4 10*3/uL (ref 0.1–1.0)
Monocytes Relative: 9 %
Neutro Abs: 3.5 10*3/uL (ref 1.7–7.7)
Neutrophils Relative %: 86 %
Platelets: 266 10*3/uL (ref 150–400)
RBC: 4.68 MIL/uL (ref 4.22–5.81)
RDW: 14.6 % (ref 11.5–15.5)
WBC: 4.1 10*3/uL (ref 4.0–10.5)
nRBC: 0 % (ref 0.0–0.2)

## 2020-03-03 LAB — LACTATE DEHYDROGENASE: LDH: 519 U/L — ABNORMAL HIGH (ref 98–192)

## 2020-03-03 LAB — LACTIC ACID, PLASMA: Lactic Acid, Venous: 1.7 mmol/L (ref 0.5–1.9)

## 2020-03-03 LAB — FERRITIN: Ferritin: 1000 ng/mL — ABNORMAL HIGH (ref 24–336)

## 2020-03-03 LAB — BRAIN NATRIURETIC PEPTIDE: B Natriuretic Peptide: 34.7 pg/mL (ref 0.0–100.0)

## 2020-03-03 LAB — TRIGLYCERIDES: Triglycerides: 129 mg/dL (ref <150)

## 2020-03-03 LAB — PROCALCITONIN: Procalcitonin: <0.1 ng/mL

## 2020-03-03 LAB — C-REACTIVE PROTEIN: CRP: 11 mg/dL — ABNORMAL HIGH (ref <1.0)

## 2020-03-03 LAB — D-DIMER, QUANTITATIVE: D-Dimer, Quant: 1.04 ug/mL-FEU — ABNORMAL HIGH (ref 0.00–0.50)

## 2020-03-03 MED ORDER — ONDANSETRON HCL 4 MG/2ML IJ SOLN: 4.0000 mg | Freq: Four times a day (QID) | INTRAMUSCULAR | Status: DC | PRN

## 2020-03-03 MED ORDER — ONDANSETRON HCL 4 MG PO TABS
4.0000 mg | ORAL_TABLET | Freq: Four times a day (QID) | ORAL | Status: DC | PRN
  Filled 2020-03-03: qty 1

## 2020-03-03 MED ORDER — ACETAMINOPHEN 325 MG PO TABS: 650.0000 mg | ORAL_TABLET | Freq: Four times a day (QID) | ORAL | Status: DC | PRN

## 2020-03-03 MED ORDER — TOCILIZUMAB 400 MG/20ML IV SOLN
800.0000 mg | Freq: Once | INTRAVENOUS | Status: DC
  Filled 2020-03-03: qty 40

## 2020-03-03 MED ORDER — BARICITINIB 2 MG PO TABS
4.0000 mg | ORAL_TABLET | Freq: Every day | ORAL | Status: DC
  Administered 2020-03-03 – 2020-03-09 (×7): 4 mg via ORAL
  Filled 2020-03-03 (×8): qty 2

## 2020-03-03 MED ORDER — ENOXAPARIN SODIUM 40 MG/0.4ML ~~LOC~~ SOLN
40.0000 mg | SUBCUTANEOUS | Status: DC
  Administered 2020-03-04: 40 mg via SUBCUTANEOUS
  Filled 2020-03-03: qty 0.4

## 2020-03-03 MED ORDER — DEXAMETHASONE SODIUM PHOSPHATE 10 MG/ML IJ SOLN
6.0000 mg | Freq: Once | INTRAMUSCULAR | Status: AC
  Administered 2020-03-03: 6 mg via INTRAVENOUS
  Filled 2020-03-03: qty 1

## 2020-03-03 MED ORDER — ENOXAPARIN SODIUM 40 MG/0.4ML ~~LOC~~ SOLN
SUBCUTANEOUS | Status: AC
  Administered 2020-03-03: 40 mg via SUBCUTANEOUS
  Filled 2020-03-03: qty 0.4

## 2020-03-03 MED ORDER — SODIUM CHLORIDE 0.9 % IV SOLN
100.0000 mg | Freq: Every day | INTRAVENOUS | Status: AC
  Administered 2020-03-04 – 2020-03-07 (×4): 100 mg via INTRAVENOUS
  Filled 2020-03-03 (×4): qty 20

## 2020-03-03 MED ORDER — DEXAMETHASONE SODIUM PHOSPHATE 10 MG/ML IJ SOLN: 6.0000 mg | INTRAMUSCULAR | Status: DC

## 2020-03-03 MED ORDER — SODIUM CHLORIDE 0.9 % IV SOLN
100.0000 mg | INTRAVENOUS | Status: AC
  Administered 2020-03-03: 100 mg via INTRAVENOUS
  Filled 2020-03-03 (×2): qty 20

## 2020-03-03 MED ORDER — MAGNESIUM HYDROXIDE 400 MG/5ML PO SUSP
30.0000 mL | Freq: Every day | ORAL | Status: DC | PRN
  Filled 2020-03-03 (×2): qty 30

## 2020-03-03 NOTE — ED Triage Notes (Signed)
Per EMS:  Pt tested positive for Covid 2 weeks ago.  Pt started SOB worsening for last two days.  85% on RA on arrival for EMS

## 2020-03-03 NOTE — ED Notes (Signed)
Lovenox has been given for night time dose 03/03/2020

## 2020-03-03 NOTE — ED Notes (Signed)
RN gave Pt. Remdesivir 100mg  ... x two.   Both have infused and flushed with NS.  Pt. Is resting at this time with no distress noted.

## 2020-03-03 NOTE — ED Notes (Signed)
Pt placed on 10L humidified O2 via large bore N/C.

## 2020-03-03 NOTE — ED Notes (Signed)
EDP said the HIV and Save tubes ok to draw with 5am blood draw

## 2020-03-03 NOTE — ED Notes (Signed)
Pt's wife is Zane Pellecchia. [phone]

## 2020-03-03 NOTE — ED Provider Notes (Addendum)
[facility] EMERGENCY DEPARTMENT Provider Note   CSN: [id number] Arrival date & time: 03/03/20  1449     History Chief Complaint  Patient presents with  . Shortness of Breath    Michael Wolf is a 64 y.o. male.  Presents to ER with concern for shortness of breath.  Patient reports he was diagnosed with COVID-19 2 weeks ago[facility].  Coworker and family member also with COVID-19.  Initially had fevers and night sweats, but these have stopped.  Has had generalized fatigue, no vomiting, no chest pain.  Shortness of breath started approximately 1 week ago, has steadily been worsening over this timeframe.  Currently his chief complaint is shortness of breath.  Denies any other acute complaints at this time.  Patient has past medical history of non-Hodgkin's lymphoma, not currently on any therapy.  Last on treatment in 2018.  Received both doses of Pfizer vaccine in February.  HPI     Past Medical History:  Diagnosis Date  . Cancer ([place])   . High cholesterol   . Hypertension   . Non Hodgkin's lymphoma ([place])   . Obesity   . Sleep apnea     There are no problems to display for this patient.   Past Surgical History:  Procedure Laterality Date  . HIP SURGERY         No family history on file.  Social History   Tobacco Use  . Smoking status: Never Smoker  . Smokeless tobacco: Never Used  Substance Use Topics  . Alcohol use: No  . Drug use: No    Home Medications Prior to Admission medications   Medication Sig Start Date End Date Taking? Authorizing Provider  Ezetimibe-Simvastatin (VYTORIN PO) Take by mouth.    [provider]  HYDROcodone-acetaminophen (NORCO/VICODIN) 5-325 MG tablet Take 1-2 tablets by mouth every 6 (six) hours as needed for severe pain. 12/01/19   Hayden Rasmussen, MD  TRIAMTERENE-HCTZ PO Take by mouth.    [provider]    Allergies    Patient has no known allergies.  Review of Systems   Review of Systems    Constitutional: Positive for chills, fatigue and fever.  HENT: Negative for ear pain and sore throat.   Eyes: Negative for pain and visual disturbance.  Respiratory: Positive for cough and shortness of breath.   Cardiovascular: Negative for chest pain and palpitations.  Gastrointestinal: Negative for abdominal pain and vomiting.  Genitourinary: Negative for dysuria and hematuria.  Musculoskeletal: Negative for arthralgias and back pain.  Skin: Negative for color change and rash.  Neurological: Negative for seizures and syncope.  All other systems reviewed and are negative.   Physical Exam Updated Vital Signs BP (!) 105/56   Pulse 94   Temp 99.1 F (37.3 C) (Oral)   Resp (!) 23   Ht 6' (1.829 m)   Wt 136.1 kg   SpO2 (!) 87%   BMI 40.69 kg/m   Physical Exam Vitals and nursing note reviewed.  Constitutional:      Appearance: He is well-developed.     Comments: Mild tachypnea, no respiratory distress  HENT:     Head: Normocephalic and atraumatic.  Eyes:     Conjunctiva/sclera: Conjunctivae normal.  Cardiovascular:     Rate and Rhythm: Normal rate and regular rhythm.     Heart sounds: No murmur heard.   Pulmonary:     Comments: Mild tachypnea, no respiratory distress, speaks in full sentences, mild crackles noted at bases Abdominal:  Palpations: Abdomen is soft.     Tenderness: There is no abdominal tenderness.  Musculoskeletal:     Cervical back: Neck supple.     Right lower leg: No edema.     Left lower leg: No edema.  Skin:    General: Skin is warm and dry.  Neurological:     General: No focal deficit present.     Mental Status: He is alert and oriented to person, place, and time.     ED Results / Procedures / Treatments   Labs (all labs ordered are listed, but only abnormal results are displayed) Labs Reviewed  CBC WITH DIFFERENTIAL/PLATELET - Abnormal; Notable for the following components:      Result Value   Lymphs Abs 0.2 (*)    All other  components within normal limits  COMPREHENSIVE METABOLIC PANEL - Abnormal; Notable for the following components:   Sodium 133 (*)    Potassium 3.3 (*)    Chloride 96 (*)    Glucose, Bld 111 (*)    Calcium 7.9 (*)    Total Protein 6.4 (*)    Albumin 2.9 (*)    AST 97 (*)    ALT 65 (*)    All other components within normal limits  D-DIMER, QUANTITATIVE (NOT AT [facility]) - Abnormal; Notable for the following components:   D-Dimer, Quant 1.04 (*)    All other components within normal limits  SARS CORONAVIRUS 2 BY RT PCR (HOSPITAL ORDER, [facility])  CULTURE, BLOOD (ROUTINE X 2)  CULTURE, BLOOD (ROUTINE X 2)  LACTIC ACID, PLASMA  BRAIN NATRIURETIC PEPTIDE  LACTIC ACID, PLASMA  PROCALCITONIN  LACTATE DEHYDROGENASE  FERRITIN  TRIGLYCERIDES  FIBRINOGEN  C-REACTIVE PROTEIN  TROPONIN I (HIGH SENSITIVITY)    EKG EKG Interpretation  Date/Time:  Friday March 03 2020 15:08:33 EDT Ventricular Rate:  92 PR Interval:    QRS Duration: 123 QT Interval:  391 QTC Calculation: 484 R Axis:   56 Text Interpretation: Sinus rhythm IVCD, consider atypical RBBB Baseline wander in lead(s) V1 Confirmed by Madalyn Rob [phone]) on 03/03/2020 4:23:41 PM   Radiology DG Chest [place] 1 View  Result Date: 03/03/2020 CLINICAL DATA:  COVID-19 diagnosed 2 weeks ago, increasing shortness of breath, hypoxemia EXAM: PORTABLE CHEST 1 VIEW COMPARISON:  None. FINDINGS: Single frontal view of the chest demonstrates an unremarkable cardiac silhouette. Diffuse interstitial prominence is seen, with faint ground-glass opacities within the mid and lower lung zones. No effusion or pneumothorax. No acute bony abnormality. IMPRESSION: 1. Interstitial prominence with bilateral ground-glass airspace disease. Differential includes multifocal atypical pneumonia versus edema. Electronically Signed   By: Randa Ngo M.D.   On: 03/03/2020 15:43    Procedures .Critical Care Performed by: Lucrezia Starch, MD Authorized by: Lucrezia Starch, MD   Critical care provider statement:    Critical care time (minutes):  45   Critical care was necessary to treat or prevent imminent or life-threatening deterioration of the following conditions:  Respiratory failure   Critical care was time spent personally by me on the following activities:  Discussions with consultants, evaluation of patient's response to treatment, examination of patient, ordering and performing treatments and interventions, ordering and review of laboratory studies, ordering and review of radiographic studies, pulse oximetry, re-evaluation of patient's condition, obtaining history from patient or surrogate and review of old charts   (including critical care time)  Medications Ordered in ED Medications  dexamethasone (DECADRON) injection 6 mg (6 mg Intravenous Given  03/03/20 1633)    ED Course  I have reviewed the triage vital signs and the nursing notes.  Pertinent labs & imaging results that were available during my care of the patient were reviewed by me and considered in my medical decision making (see chart for details).    MDM Rules/Calculators/A&P                          64 year old gentleman who presented to ER with concern for shortness of breath in setting of recently diagnosed COVID-19.  On exam, patient noted to have some tachypnea but not in respiratory distress.  Work-up notable for multifocal atypical pneumonia on CXR.  Labs grossly stable, D-dimer is minimally elevated which I suspect is related to COVID-19 and I suspect his current respiratory failure is from Covid and less likely acute pulmonary embolism.  EKG without ischemic change, troponin normal limits, doubt ACS.  Patient is currently on 10 L nasal cannula and tolerating well.  Will need hospitalist admission for further management.  Started Decadron.   ADDENDUM -reviewed case in detail with admitting doctor Dr. Ree Kida, hospitalist.  She  recommends initiating remdesivir and Actemra for patient in addition to steroids.  Will review with patient and obtained informed consent.  In case patient is boarding for prolonged period of time at [facility], placed routine admission orders for Covid including VTE prophylaxis, scheduled steroids and remdesivir and placed order for Actemra.    Michael Wolf was evaluated in Emergency Department on 03/03/2020 for the symptoms described in the history of present illness. He was evaluated in the context of the global COVID-19 pandemic, which necessitated consideration that the patient might be at risk for infection with the SARS-CoV-2 virus that causes COVID-19. Institutional protocols and algorithms that pertain to the evaluation of patients at risk for COVID-19 are in a state of rapid change based on information released by regulatory bodies including the CDC and federal and state organizations. These policies and algorithms were followed during the patient's care in the ED.   Final Clinical Impression(s) / ED Diagnoses Final diagnoses:  Acute respiratory failure with hypoxia ([place])  COVID-19  Pneumonia due to COVID-19 virus  Multifocal pneumonia    Rx / DC Orders ED Discharge Orders    None       Lucrezia Starch, MD 03/03/20 1639    Lucrezia Starch, MD 03/03/20 Dionicia Abler    Lucrezia Starch, MD 03/03/20 2119

## 2020-03-03 NOTE — ED Notes (Signed)
Pt. Had Remdesivir 200mg 

## 2020-03-03 NOTE — ED Notes (Addendum)
Spoke with Resp. Therapist about flutter valve  Incentive spirometer. Steve Resp. Will be in to see Pt.

## 2020-03-04 ENCOUNTER — Other Ambulatory Visit: Payer: Self-pay

## 2020-03-04 DIAGNOSIS — G4730 Sleep apnea, unspecified: Secondary | ICD-10-CM | POA: Diagnosis present

## 2020-03-04 DIAGNOSIS — I10 Essential (primary) hypertension: Secondary | ICD-10-CM | POA: Diagnosis present

## 2020-03-04 DIAGNOSIS — U071 COVID-19: Secondary | ICD-10-CM | POA: Diagnosis present

## 2020-03-04 DIAGNOSIS — J9601 Acute respiratory failure with hypoxia: Secondary | ICD-10-CM

## 2020-03-04 DIAGNOSIS — E876 Hypokalemia: Secondary | ICD-10-CM | POA: Diagnosis present

## 2020-03-04 DIAGNOSIS — T380X5A Adverse effect of glucocorticoids and synthetic analogues, initial encounter: Secondary | ICD-10-CM | POA: Diagnosis not present

## 2020-03-04 DIAGNOSIS — I959 Hypotension, unspecified: Secondary | ICD-10-CM

## 2020-03-04 DIAGNOSIS — Z6841 Body Mass Index (BMI) 40.0 and over, adult: Secondary | ICD-10-CM | POA: Diagnosis not present

## 2020-03-04 DIAGNOSIS — E785 Hyperlipidemia, unspecified: Secondary | ICD-10-CM | POA: Diagnosis present

## 2020-03-04 DIAGNOSIS — Y92239 Unspecified place in hospital as the place of occurrence of the external cause: Secondary | ICD-10-CM | POA: Diagnosis not present

## 2020-03-04 DIAGNOSIS — E871 Hypo-osmolality and hyponatremia: Secondary | ICD-10-CM | POA: Diagnosis present

## 2020-03-04 DIAGNOSIS — C8590 Non-Hodgkin lymphoma, unspecified, unspecified site: Secondary | ICD-10-CM | POA: Diagnosis present

## 2020-03-04 DIAGNOSIS — J9600 Acute respiratory failure, unspecified whether with hypoxia or hypercapnia: Secondary | ICD-10-CM | POA: Diagnosis present

## 2020-03-04 DIAGNOSIS — E860 Dehydration: Secondary | ICD-10-CM | POA: Diagnosis present

## 2020-03-04 DIAGNOSIS — E7800 Pure hypercholesterolemia, unspecified: Secondary | ICD-10-CM | POA: Diagnosis present

## 2020-03-04 DIAGNOSIS — J1282 Pneumonia due to coronavirus disease 2019: Secondary | ICD-10-CM

## 2020-03-04 DIAGNOSIS — Z79899 Other long term (current) drug therapy: Secondary | ICD-10-CM | POA: Diagnosis not present

## 2020-03-04 DIAGNOSIS — E1165 Type 2 diabetes mellitus with hyperglycemia: Secondary | ICD-10-CM | POA: Diagnosis not present

## 2020-03-04 LAB — COMPREHENSIVE METABOLIC PANEL
ALT: 76 U/L — ABNORMAL HIGH (ref 0–44)
AST: 102 U/L — ABNORMAL HIGH (ref 15–41)
Albumin: 2.6 g/dL — ABNORMAL LOW (ref 3.5–5.0)
Alkaline Phosphatase: 59 U/L (ref 38–126)
Anion gap: 12 (ref 5–15)
BUN: 17 mg/dL (ref 8–23)
CO2: 27 mmol/L (ref 22–32)
Calcium: 8.2 mg/dL — ABNORMAL LOW (ref 8.9–10.3)
Chloride: 99 mmol/L (ref 98–111)
Creatinine, Ser: 0.94 mg/dL (ref 0.61–1.24)
GFR calc Af Amer: >60 mL/min (ref >60)
GFR calc non Af Amer: >60 mL/min (ref >60)
Glucose, Bld: 130 mg/dL — ABNORMAL HIGH (ref 70–99)
Potassium: 4.2 mmol/L (ref 3.5–5.1)
Sodium: 138 mmol/L (ref 135–145)
Total Bilirubin: 0.5 mg/dL (ref 0.3–1.2)
Total Protein: 6.1 g/dL — ABNORMAL LOW (ref 6.5–8.1)

## 2020-03-04 LAB — ABO/RH: ABO/RH(D): O NEG

## 2020-03-04 MED ORDER — SODIUM CHLORIDE 0.9% FLUSH
3.0000 mL | Freq: Two times a day (BID) | INTRAVENOUS | Status: DC
  Administered 2020-03-04 – 2020-03-09 (×11): 3 mL via INTRAVENOUS

## 2020-03-04 MED ORDER — ALBUTEROL SULFATE HFA 108 (90 BASE) MCG/ACT IN AERS
2.0000 | INHALATION_SPRAY | Freq: Three times a day (TID) | RESPIRATORY_TRACT | Status: DC
  Administered 2020-03-04 – 2020-03-09 (×15): 2 via RESPIRATORY_TRACT
  Filled 2020-03-04: qty 6.7

## 2020-03-04 MED ORDER — SODIUM CHLORIDE 0.9 % IV SOLN: INTRAVENOUS | Status: AC

## 2020-03-04 MED ORDER — ENSURE ENLIVE PO LIQD
237.0000 mL | Freq: Two times a day (BID) | ORAL | Status: DC
  Administered 2020-03-04: 237 mL via ORAL

## 2020-03-04 MED ORDER — GUAIFENESIN-DM 100-10 MG/5ML PO SYRP: 10.0000 mL | ORAL_SOLUTION | ORAL | Status: DC | PRN

## 2020-03-04 MED ORDER — ZINC SULFATE 220 (50 ZN) MG PO CAPS
220.0000 mg | ORAL_CAPSULE | Freq: Every day | ORAL | Status: DC
  Administered 2020-03-04 – 2020-03-09 (×6): 220 mg via ORAL
  Filled 2020-03-04 (×6): qty 1

## 2020-03-04 MED ORDER — POTASSIUM CHLORIDE CRYS ER 20 MEQ PO TBCR
40.0000 meq | EXTENDED_RELEASE_TABLET | ORAL | Status: AC
  Administered 2020-03-04: 40 meq via ORAL
  Filled 2020-03-04: qty 2

## 2020-03-04 MED ORDER — ROSUVASTATIN CALCIUM 20 MG PO TABS
40.0000 mg | ORAL_TABLET | Freq: Every evening | ORAL | Status: DC
  Administered 2020-03-04 – 2020-03-08 (×5): 40 mg via ORAL
  Filled 2020-03-04 (×5): qty 2

## 2020-03-04 MED ORDER — HYDROCOD POLST-CPM POLST ER 10-8 MG/5ML PO SUER: 5.0000 mL | Freq: Two times a day (BID) | ORAL | Status: DC | PRN

## 2020-03-04 MED ORDER — METHYLPREDNISOLONE SODIUM SUCC 125 MG IJ SOLR: 70.0000 mg | Freq: Two times a day (BID) | INTRAMUSCULAR | Status: DC

## 2020-03-04 MED ORDER — GABAPENTIN 300 MG PO CAPS
300.0000 mg | ORAL_CAPSULE | Freq: Two times a day (BID) | ORAL | Status: DC
  Administered 2020-03-04 – 2020-03-09 (×11): 300 mg via ORAL
  Filled 2020-03-04 (×11): qty 1

## 2020-03-04 MED ORDER — EZETIMIBE 10 MG PO TABS
10.0000 mg | ORAL_TABLET | Freq: Every day | ORAL | Status: DC
  Administered 2020-03-04 – 2020-03-09 (×6): 10 mg via ORAL
  Filled 2020-03-04 (×6): qty 1

## 2020-03-04 MED ORDER — ENSURE ENLIVE PO LIQD
237.0000 mL | Freq: Three times a day (TID) | ORAL | Status: DC
  Administered 2020-03-04 – 2020-03-09 (×10): 237 mL via ORAL

## 2020-03-04 MED ORDER — ALBUTEROL SULFATE HFA 108 (90 BASE) MCG/ACT IN AERS
2.0000 | INHALATION_SPRAY | Freq: Four times a day (QID) | RESPIRATORY_TRACT | Status: DC
  Administered 2020-03-04 (×2): 2 via RESPIRATORY_TRACT
  Filled 2020-03-04: qty 6.7

## 2020-03-04 MED ORDER — VENLAFAXINE HCL ER 75 MG PO CP24
75.0000 mg | ORAL_CAPSULE | Freq: Every day | ORAL | Status: DC
  Administered 2020-03-04 – 2020-03-09 (×6): 75 mg via ORAL
  Filled 2020-03-04 (×6): qty 1

## 2020-03-04 MED ORDER — ASCORBIC ACID 500 MG PO TABS
500.0000 mg | ORAL_TABLET | Freq: Every day | ORAL | Status: DC
  Administered 2020-03-04 – 2020-03-09 (×6): 500 mg via ORAL
  Filled 2020-03-04 (×6): qty 1

## 2020-03-04 MED ORDER — CLONAZEPAM 0.5 MG PO TABS
0.5000 mg | ORAL_TABLET | Freq: Every day | ORAL | Status: DC
  Administered 2020-03-04: 1 mg via ORAL
  Administered 2020-03-05 – 2020-03-08 (×4): 0.5 mg via ORAL
  Filled 2020-03-04 (×5): qty 1

## 2020-03-04 MED ORDER — METHYLPREDNISOLONE SODIUM SUCC 125 MG IJ SOLR
70.0000 mg | Freq: Two times a day (BID) | INTRAMUSCULAR | Status: DC
  Administered 2020-03-04 – 2020-03-09 (×11): 70 mg via INTRAVENOUS
  Filled 2020-03-04 (×11): qty 2

## 2020-03-04 MED ORDER — FAMOTIDINE 20 MG PO TABS
20.0000 mg | ORAL_TABLET | Freq: Two times a day (BID) | ORAL | Status: DC
  Administered 2020-03-04 – 2020-03-05 (×3): 20 mg via ORAL
  Filled 2020-03-04 (×3): qty 1

## 2020-03-04 MED ORDER — LORATADINE 10 MG PO TABS
10.0000 mg | ORAL_TABLET | Freq: Every day | ORAL | Status: DC
  Administered 2020-03-04 – 2020-03-09 (×6): 10 mg via ORAL
  Filled 2020-03-04 (×6): qty 1

## 2020-03-04 MED ORDER — ENOXAPARIN SODIUM 80 MG/0.8ML ~~LOC~~ SOLN
70.0000 mg | SUBCUTANEOUS | Status: DC
  Administered 2020-03-05 – 2020-03-09 (×5): 70 mg via SUBCUTANEOUS
  Filled 2020-03-04 (×5): qty 0.8

## 2020-03-04 NOTE — Plan of Care (Signed)
  Problem: Education: Goal: Knowledge of risk factors and measures for prevention of condition will improve Outcome: Progressing   Problem: Coping: Goal: Psychosocial and spiritual needs will be supported Outcome: Progressing   Problem: Respiratory: Goal: Will maintain a patent airway Outcome: Progressing Goal: Complications related to the disease process, condition or treatment will be avoided or minimized Outcome: Progressing   

## 2020-03-04 NOTE — H&P (Signed)
History and Physical    Michael Wolf YKD:[id number] DOB: 1956/04/26 DOA: 03/03/2020  Referring MD/NP/PA: Cristal Ford, DO PCP: [name], MD  Patient coming from: [facility] transfer  Chief Complaint: Shortness of breath  I have personally briefly reviewed patient's old medical records in [place]   HPI: Michael Wolf is a 64 y.o. male with medical history significant of hypertension, hyperlipidemia, non-Hodgkin's lymphoma in remission, and morbid obesity presents with complaints of shortness of breath and cough.  [facility].  He had received the second of the two Pfizer vaccines in February.  He complains of having flulike symptoms reporting headache, fever, change in taste, no appetite, and myalgias.  He got so weak that he was unable to get up and move.  Not having any complaints of chest pain, vomiting, diarrhea  ED Course: Upon admission into the emergency department patient was seen to be afebrile, respiration 0-29, blood pressures 90/63-113/72, and O2 saturations documented as low as 85% on room air with improvement on 10 L of high flow nasal cannula oxygen.  Labs 8/20 significant for sodium 133, potassium 3.3, CRP 11, LDH 19, ferritin 1000, D-dimer 1.04,  fibrinogen 661, BNP 34.7, and procalcitonin <0.1.  Chest x-ray showed multifocal pneumonia versus edema.  Patient had been given remdesivir, 6 mg of Decadron, and started on baricitinib.  Review of Systems  Constitutional: Positive for malaise/fatigue.  HENT: Negative for congestion.   Eyes: Negative for photophobia and pain.  Respiratory: Positive for cough and shortness of breath.   Cardiovascular: Negative for chest pain and leg swelling.  Gastrointestinal: Negative for abdominal pain, constipation, diarrhea, nausea and vomiting.  Genitourinary: Negative for dysuria and hematuria.  Musculoskeletal: Positive for joint pain and myalgias.  Skin: Negative for  rash.  Neurological: Positive for weakness and headaches. Negative for loss of consciousness.  Psychiatric/Behavioral: Negative for substance abuse. The patient is not nervous/anxious.     Past Medical History:  Diagnosis Date  . Cancer ([place])   . High cholesterol   . Hypertension   . Non Hodgkin's lymphoma ([place])   . Obesity   . Sleep apnea     Past Surgical History:  Procedure Laterality Date  . HIP SURGERY       reports that he has never smoked. He has never used smokeless tobacco. He reports that he does not drink alcohol and does not use drugs.  No Known Allergies  No family history on file.  Prior to Admission medications   Medication Sig Start Date End Date Taking? Authorizing Provider  Ezetimibe-Simvastatin (VYTORIN PO) Take by mouth.    [provider]  HYDROcodone-acetaminophen (NORCO/VICODIN) 5-325 MG tablet Take 1-2 tablets by mouth every 6 (six) hours as needed for severe pain. 12/01/19   Hayden Rasmussen, MD  TRIAMTERENE-HCTZ PO Take by mouth.    [provider]    Physical Exam:  Constitutional: Morbidly obese male who appears ill Vitals:   03/04/20 0412 03/04/20 0430 03/04/20 0500 03/04/20 0622  BP: 90/63 (!) 95/59 (!) 97/56 (!) 103/58  Pulse: 67 68 68   Resp: (!) 25 (!) 29 (!) 0 19  Temp:   97.8 F (36.6 C) 97.7 F (36.5 C)  TempSrc:   Oral Oral  SpO2: 96% 93% 93% 93%  Weight:      Height:       Eyes: PERRL, lids and conjunctivae normal ENMT: Mucous membranes are moist. Posterior pharynx clear of any exudate  or lesions.  Neck: normal, supple, no masses, no thyromegaly Respiratory: Mildly tachypneic on 10 L of high flow nasal cannula oxygen.  No crackles heard in the lung fields.  Currently able to talk in short sentences. Cardiovascular: Regular rate and rhythm, no murmurs / rubs / gallops. No extremity edema. 2+ pedal pulses. No carotid bruits.  Abdomen: no tenderness, no masses palpated. No hepatosplenomegaly. Bowel sounds  positive.  Musculoskeletal: no clubbing / cyanosis. No joint deformity upper and lower extremities. Good ROM, no contractures. Normal muscle tone.  Skin: no rashes, lesions, ulcers. No induration Neurologic: CN 2-12 grossly intact. Sensation intact, DTR normal. Strength 5/5 in all 4.  Psychiatric: Normal judgment and insight. Alert and oriented x 3. Normal mood.     Labs on Admission: I have personally reviewed following labs and imaging studies  CBC: Recent Labs  Lab 03/03/20 1526  WBC 4.1  NEUTROABS 3.5  HGB 13.7  HCT 41.7  MCV 89.1  PLT 160   Basic Metabolic Panel: Recent Labs  Lab 03/03/20 1526  NA 133*  K 3.3*  CL 96*  CO2 25  GLUCOSE 111*  BUN 17  CREATININE 1.11  CALCIUM 7.9*   GFR: Estimated Creatinine Clearance: 94.8 mL/min (by C-G formula based on SCr of 1.11 mg/dL). Liver Function Tests: Recent Labs  Lab 03/03/20 1526  AST 97*  ALT 65*  ALKPHOS 56  BILITOT 0.5  PROT 6.4*  ALBUMIN 2.9*   No results for input(s): LIPASE, AMYLASE in the last 168 hours. No results for input(s): AMMONIA in the last 168 hours. Coagulation Profile: No results for input(s): INR, PROTIME in the last 168 hours. Cardiac Enzymes: No results for input(s): CKTOTAL, CKMB, CKMBINDEX, TROPONINI in the last 168 hours. BNP (last 3 results) No results for input(s): PROBNP in the last 8760 hours. HbA1C: No results for input(s): HGBA1C in the last 72 hours. CBG: No results for input(s): GLUCAP in the last 168 hours. Lipid Profile: Recent Labs    03/03/20 1526  TRIG 129   Thyroid Function Tests: No results for input(s): TSH, T4TOTAL, FREET4, T3FREE, THYROIDAB in the last 72 hours. Anemia Panel: Recent Labs    03/03/20 1526  FERRITIN 1,000*   Urine analysis: No results found for: COLORURINE, APPEARANCEUR, LABSPEC, PHURINE, GLUCOSEU, HGBUR, BILIRUBINUR, KETONESUR, PROTEINUR, UROBILINOGEN, NITRITE, LEUKOCYTESUR Sepsis Labs: Recent Results (from the past 240 hour(s))  SARS  Coronavirus 2 by RT PCR (hospital order, performed in [place] hospital lab) Nasopharyngeal     Status: Abnormal   Collection Time: 03/03/20  3:26 PM   Specimen: Nasopharyngeal  Result Value Ref Range Status   SARS Coronavirus 2 POSITIVE (A) NEGATIVE Final    Comment: RESULT CALLED TO, READ BACK BY AND VERIFIED WITH: MARVA SIMMS RN @1656  03/03/2020 OLSONM (NOTE) SARS-CoV-2 target nucleic acids are DETECTED  SARS-CoV-2 RNA is generally detectable in upper respiratory specimens  during the acute phase of infection.  Positive results are indicative  of the presence of the identified virus, but do not rule out bacterial infection or co-infection with other pathogens not detected by the test.  Clinical correlation with patient history and  other diagnostic information is necessary to determine patient infection status.  The expected result is negative.  Fact Sheet for Patients:   StrictlyIdeas.no   Fact Sheet for Healthcare Providers:   BankingDealers.co.za    This test is not yet approved or cleared by the [place] FDA and  has been authorized for detection and/or diagnosis of SARS-CoV-2 by FDA  under an Emergency Use Authorization (EUA).  This EUA will remain in effect (meaning thi s test can be used) for the duration of  the COVID-19 declaration under Section 564(b)(1) of the Act, 21 U.S.C. section 360-bbb-3(b)(1), unless the authorization is terminated or revoked sooner.  Performed at [facility], [address] [place], [place] [address]      Radiological Exams on Admission: DG Chest [place] 1 View  Result Date: 03/03/2020 CLINICAL DATA:  COVID-19 diagnosed 2 weeks ago, increasing shortness of breath, hypoxemia EXAM: PORTABLE CHEST 1 VIEW COMPARISON:  None. FINDINGS: Single frontal view of the chest demonstrates an unremarkable cardiac silhouette. Diffuse interstitial prominence is seen, with faint ground-glass  opacities within the mid and lower lung zones. No effusion or pneumothorax. No acute bony abnormality. IMPRESSION: 1. Interstitial prominence with bilateral ground-glass airspace disease. Differential includes multifocal atypical pneumonia versus edema. Electronically Signed   By: Randa Ngo M.D.   On: 03/03/2020 15:43    EKG: Independently reviewed. Sinus rhythm at 92 bpm with a interventricular conduction delay  Assessment/Plan Acute respiratory failure with hypoxia secondary to pneumonia due to COVID-19: Patient presented with progressively worsening shortness of breath.  [facility]  Chest x-ray showing multifocal atypical pneumonia versus edema.  Inflammatory markers elevated including CRP 11, LDH 19, ferritin 1000, D-dimer 1.04, and fibrinogen 661.  Procalcitonin was <0.1 for which would not acutely start antibiotics.  O2 saturations noted to be as low as 84% on room air and currently O2 saturations maintained on 10 L of HFNC. patient has been started on remdesivir, Decadron, and baricitinib. -Admit to a progressive bed -Continuous pulse oximetry with nasal cannula oxygen to maintain O2 saturations -Continue remdesivir day 2 of 5 -Continue baricitinib day 2 of 14 -Switched Decadron to Solu-Medrol effective day 2 of 10 -Albuterol inhaler -Vitamin C and zinc -Antitussives as needed -Pepcid twice daily for GI prophylaxis -Continue to monitor inflammatory markers daily  Transient hypotension: On admission blood pressures noted to be as low as 81/46.  Home blood pressure medications include triamterene-hydrochlorothiazide -Hold triamterene hydrochlorothiazide -Give normal saline IV fluids at 100 mL/h x 5 hours  Hypokalemia: Acute.  Potassium noted to be 3.3 on 8/21.  Patient was not given replacement at that time.  Home medications include -Give 40 mEq of potassium chloride x1 dose  Hyponatremia: Acute.  Sodium was noted to be initially  133.  Suspect hypovolemic hyponatremia. -IV fluids as seen above -Continue to monitor sodium level  Hyperlipidemia: Home medications include ezetimibe 10 mg daily and Crestor 40 mg daily. -Continue home regimen  History of non-Hodgkin's lymphoma: Patient reported to be in remission since 2018.  Morbid obesity: BMI 40.69kg/m   DVT prophylaxis: Lovenox Code Status: Full Family Communication: Patient declined need to update family at this time Disposition Plan: Possibly discharge home in 4 to 5 days if respiratory status improves Consults called: None Admission status: Inpatient  Norval Morton MD [facility] Pager [phone]   If 7PM-7AM, please contact night-coverage [email or web address] Password [facility]  03/04/2020, 7:27 AM

## 2020-03-04 NOTE — Progress Notes (Signed)
Pt admitted to 5W31. Pt is alert and oriented x4. Pt is currently on 10L HFNC SpO2: 94%. BP: 103/58. He is afebriel and has no complaints of pain at this time.  Pt is resting comfortably time.

## 2020-03-04 NOTE — Progress Notes (Signed)
Initial Nutrition Assessment  DOCUMENTATION CODES:   Morbid obesity  INTERVENTION:  Plan is to liberalize diet to regular.  Increase to Ensure Enlive po TID, each supplement provides 350 kcal and 20 grams of protein.  NUTRITION DIAGNOSIS:   Increased nutrient needs related to catabolic illness (WCBJS-28) as evidenced by estimated needs.  GOAL:   Patient will meet greater than or equal to 90% of their needs  MONITOR:   PO intake, Supplement acceptance, Labs, Weight trends, I & O's  REASON FOR ASSESSMENT:   Malnutrition Screening Tool    ASSESSMENT:   64 year old male with PMHx of HTN, HLD, sleep apnea, non Hodgkin's lymphoma in remission admitted with COVID-19, transient hypotension, hypokalemia, hyponatremia.   Spoke with patient over the phone. He reports he has had a decreased appetite and intake for a week now. He reports it is slowly starting to improve. He is still attempting to eat 3 meals per day but is eating less at meals. He only ate 20% of his breakfast this morning. Patient reports he was able to eat better at lunch and ate almost everything off his plate. Discussed increased calorie/protein needs related to catabolic nature of BTDVV-61. Patient is already ordered for Ensure and reports he enjoys them. Encouraged patient to drink TID between meals to help meet calorie/protein needs.  Patient reports his UBW was 303-309 lbs. He reports he was weighed on bed scale and it was documented on white board (not yet documented in chart) as 141.5 kg (311.3 lbs).  Medications reviewed and include: vitamin C 500 mg daily, famotidine, gabapentin, Solu-Medrol 70 mg Q12hrs IV, zinc sulfate 220 mg daily, remdesivir.  Labs reviewed: On 8/20 Sodium 133, Potassium 3.3, Chloride 96.  Unable to determine if patient meets criteria for malnutrition at this time.  Discussed with MD via secure chat. Plan is to liberalize diet to regular.  NUTRITION - FOCUSED PHYSICAL EXAM:  Unable to  complete as RD is working remotely.  Diet Order:   Diet Order            Diet regular Room service appropriate? Yes; Fluid consistency: Thin  Diet effective now                EDUCATION NEEDS:   No education needs have been identified at this time  Skin:  Skin Assessment: Reviewed RN Assessment  Last BM:  03/03/2020 per chart  Height:   Ht Readings from Last 1 Encounters:  03/03/20 6' (1.829 m)   Weight:   Wt Readings from Last 1 Encounters:  03/03/20 136.1 kg   BMI:  Body mass index is 40.69 kg/m.  Estimated Nutritional Needs:   Kcal:  2800-3000  Protein:  140-150 grams  Fluid:  >/= 2.4 L/day  Jacklynn Barnacle, MS, RD, LDN Pager number available on Amion

## 2020-03-05 ENCOUNTER — Inpatient Hospital Stay (HOSPITAL_COMMUNITY): Payer: Medicare Other

## 2020-03-05 LAB — COMPREHENSIVE METABOLIC PANEL
ALT: 85 U/L — ABNORMAL HIGH (ref 0–44)
AST: 95 U/L — ABNORMAL HIGH (ref 15–41)
Albumin: 2.5 g/dL — ABNORMAL LOW (ref 3.5–5.0)
Alkaline Phosphatase: 59 U/L (ref 38–126)
Anion gap: 9 (ref 5–15)
BUN: 22 mg/dL (ref 8–23)
CO2: 27 mmol/L (ref 22–32)
Calcium: 8.4 mg/dL — ABNORMAL LOW (ref 8.9–10.3)
Chloride: 102 mmol/L (ref 98–111)
Creatinine, Ser: 0.97 mg/dL (ref 0.61–1.24)
GFR calc Af Amer: >60 mL/min (ref >60)
GFR calc non Af Amer: >60 mL/min (ref >60)
Glucose, Bld: 190 mg/dL — ABNORMAL HIGH (ref 70–99)
Potassium: 4.3 mmol/L (ref 3.5–5.1)
Sodium: 138 mmol/L (ref 135–145)
Total Bilirubin: 0.3 mg/dL (ref 0.3–1.2)
Total Protein: 5.6 g/dL — ABNORMAL LOW (ref 6.5–8.1)

## 2020-03-05 LAB — CBC WITH DIFFERENTIAL/PLATELET
Abs Immature Granulocytes: 0.05 10*3/uL (ref 0.00–0.07)
Basophils Absolute: 0 10*3/uL (ref 0.0–0.1)
Basophils Relative: 0 %
Eosinophils Absolute: 0 10*3/uL (ref 0.0–0.5)
Eosinophils Relative: 0 %
HCT: 40.4 % (ref 39.0–52.0)
Hemoglobin: 13 g/dL (ref 13.0–17.0)
Immature Granulocytes: 1 %
Lymphocytes Relative: 3 %
Lymphs Abs: 0.2 10*3/uL — ABNORMAL LOW (ref 0.7–4.0)
MCH: 28.6 pg (ref 26.0–34.0)
MCHC: 32.2 g/dL (ref 30.0–36.0)
MCV: 89 fL (ref 80.0–100.0)
Monocytes Absolute: 0.3 10*3/uL (ref 0.1–1.0)
Monocytes Relative: 4 %
Neutro Abs: 6.4 10*3/uL (ref 1.7–7.7)
Neutrophils Relative %: 92 %
Platelets: 316 10*3/uL (ref 150–400)
RBC: 4.54 MIL/uL (ref 4.22–5.81)
RDW: 14.3 % (ref 11.5–15.5)
WBC: 6.9 10*3/uL (ref 4.0–10.5)
nRBC: 0 % (ref 0.0–0.2)

## 2020-03-05 LAB — MAGNESIUM: Magnesium: 2.4 mg/dL (ref 1.7–2.4)

## 2020-03-05 LAB — FERRITIN: Ferritin: 927 ng/mL — ABNORMAL HIGH (ref 24–336)

## 2020-03-05 LAB — BRAIN NATRIURETIC PEPTIDE: B Natriuretic Peptide: 26.7 pg/mL (ref 0.0–100.0)

## 2020-03-05 LAB — HIV ANTIBODY (ROUTINE TESTING W REFLEX): HIV Screen 4th Generation wRfx: NONREACTIVE

## 2020-03-05 LAB — C-REACTIVE PROTEIN: CRP: 6.1 mg/dL — ABNORMAL HIGH (ref <1.0)

## 2020-03-05 LAB — PHOSPHORUS: Phosphorus: 3.4 mg/dL (ref 2.5–4.6)

## 2020-03-05 LAB — D-DIMER, QUANTITATIVE: D-Dimer, Quant: 0.72 ug/mL-FEU — ABNORMAL HIGH (ref 0.00–0.50)

## 2020-03-05 MED ORDER — PANTOPRAZOLE SODIUM 40 MG PO TBEC
40.0000 mg | DELAYED_RELEASE_TABLET | Freq: Every day | ORAL | Status: DC
  Administered 2020-03-05 – 2020-03-09 (×5): 40 mg via ORAL
  Filled 2020-03-05 (×5): qty 1

## 2020-03-05 MED ORDER — LACTATED RINGERS IV BOLUS
500.0000 mL | Freq: Once | INTRAVENOUS | Status: AC
  Administered 2020-03-05: 500 mL via INTRAVENOUS

## 2020-03-05 MED ORDER — DOCUSATE SODIUM 100 MG PO CAPS
200.0000 mg | ORAL_CAPSULE | Freq: Two times a day (BID) | ORAL | Status: DC
  Administered 2020-03-05 – 2020-03-09 (×7): 200 mg via ORAL
  Filled 2020-03-05 (×8): qty 2

## 2020-03-05 NOTE — Progress Notes (Signed)
PROGRESS NOTE                                                                                                                                                                                                             Patient Demographics:    Michael Wolf, is a 64 y.o. male, DOB - February 02, 1956, FVC:[id number]  Outpatient Primary MD for the patient is [name], MD    LOS - 1  Admit date - 03/03/2020    Chief Complaint  Patient presents with  . Shortness of Breath       Brief Narrative - Nakia Koble is a 64 y.o. male with medical history significant of hypertension, hyperlipidemia, non-Hodgkin's lymphoma in remission, and morbid obesity presents with complaints of shortness of breath and cough, apparently he was diagnosed outpatient with COVID-19 initially 2 weeks ago.  He is fully vaccinated and finished his vaccinations in February 2021.  In the ER he was diagnosed with acute hypoxic respiratory failure due to COVID-19 pneumonia and was admitted to the hospital.   Subjective:    Michael Wolf today has, No headache, No chest pain, No abdominal pain - No Nausea, No new weakness tingling or numbness, +ve SOB.   Assessment  & Plan :     1. Acute Hypoxic Resp. Failure due to Acute Covid 19 Viral Pneumonitis during the ongoing 2020 Covid 19 Pandemic - he has severe parenchymal lung injury and has been appropriately placed on high-dose IV steroids, baricitinib and remdesivir in the ER itself.  This will be continued.  Will monitor closely.  Thankfully his CRP is now coming down.  Encouraged the patient to sit up in chair in the daytime use I-S and flutter valve for pulmonary toiletry and then prone in bed when at night.  Will advance activity and titrate down oxygen as possible.    SpO2: 90 % O2 Flow Rate (L/min): 10 L/min  Recent Labs  Lab 03/03/20 1526 03/04/20 1406 03/05/20 0249  WBC 4.1  --  6.9  PLT  266  --  316  CRP 11.0*  --  6.1*  AST 97* 102* 95*  ALT 65* 76* 85*  ALKPHOS 56 59 59  BILITOT 0.5 0.5 0.3  ALBUMIN 2.9* 2.6* 2.5*  DDIMER 1.04*  --  0.72*  PROCALCITON <0.10  --   --  LATICACIDVEN 1.7  --   --   SARSCOV2NAA POSITIVE*  --   --      Hepatic Function Latest Ref Rng & Units 03/05/2020 03/04/2020 03/03/2020  Total Protein 6.5 - 8.1 g/dL 5.6(L) 6.1(L) 6.4(L)  Albumin 3.5 - 5.0 g/dL 2.5(L) 2.6(L) 2.9(L)  AST 15 - 41 U/L 95(H) 102(H) 97(H)  ALT 0 - 44 U/L 85(H) 76(H) 65(H)  Alk Phosphatase 38 - 126 U/L 59 59 56  Total Bilirubin 0.3 - 1.2 mg/dL 0.3 0.5 0.5    2.  Hypotension due to mild dehydration.  Hold home dose diuretics, gentle IV fluids for another 5 hours.  Monitor.  3. Dyslipidemia.  Continue home dose Crestor and Ezetimibe.  4.  Non-Hodgkin's lymphoma.  Supposedly in remission since 2018.  5.  Obesity.  BMI 40.  Follow with PCP for weight loss.      Condition - Extremely Guarded  Family Communication  :  wife [name] [phone] on 03/05/2020.  10:55 AM message left.  Code Status :  Full  Consults  :  None  Procedures  :  None  PUD Prophylaxis : PPI  Disposition Plan  :    Status is: Inpatient  Remains inpatient appropriate because:IV treatments appropriate due to intensity of illness or inability to take PO and Inpatient level of care appropriate due to severity of illness   Dispo: The patient is from: Home              Anticipated d/c is to: Home              Anticipated d/c date is: > 3 days              Patient currently is not medically stable to d/c.   DVT Prophylaxis  :  Lovenox   Lab Results  Component Value Date   PLT 316 03/05/2020    Diet :  Diet Order            Diet regular Room service appropriate? Yes; Fluid consistency: Thin  Diet effective now                  Inpatient Medications  Scheduled Meds: . albuterol  2 puff Inhalation TID  . vitamin C  500 mg Oral Daily  . baricitinib  4 mg Oral Daily  .  clonazePAM  0.5-1 mg Oral QHS  . enoxaparin (LOVENOX) injection  70 mg Subcutaneous Q24H  . ezetimibe  10 mg Oral Daily  . feeding supplement (ENSURE ENLIVE)  237 mL Oral TID BM  . gabapentin  300 mg Oral Q12H  . loratadine  10 mg Oral Daily  . methylPREDNISolone (SOLU-MEDROL) injection  70 mg Intravenous Q12H  . pantoprazole  40 mg Oral Daily  . rosuvastatin  40 mg Oral QPM  . sodium chloride flush  3 mL Intravenous Q12H  . venlafaxine XR  75 mg Oral Q breakfast  . zinc sulfate  220 mg Oral Daily   Continuous Infusions: . lactated ringers    . remdesivir 100 mg in NS 100 mL 100 mg (03/05/20 1000)   PRN Meds:.acetaminophen, chlorpheniramine-HYDROcodone, guaiFENesin-dextromethorphan, magnesium hydroxide, [DISCONTINUED] ondansetron **OR** ondansetron (ZOFRAN) IV  Antibiotics  :    Anti-infectives (From admission, onward)   Start     Dose/Rate Route Frequency Ordered Stop   03/04/20 1000  remdesivir 100 mg in sodium chloride 0.9 % 100 mL IVPB        100 mg 200 mL/hr over 30 Minutes Intravenous  Daily 03/03/20 1736 03/08/20 0959   03/03/20 1800  remdesivir 100 mg in sodium chloride 0.9 % 100 mL IVPB        100 mg 200 mL/hr over 30 Minutes Intravenous Every 30 min 03/03/20 1736 03/03/20 1934       Time Spent in minutes  30   Lala Lund M.D on 03/05/2020 at 10:56 AM  To page go to [email or web address] - password [email or web address]  [facility] -  Office  [phone]    See all Orders from today for further details    Objective:   Vitals:   03/04/20 1548 03/04/20 2041 03/05/20 0504 03/05/20 0747  BP:  103/62 112/63 (!) 95/59  Pulse:  90 70 78  Resp:  16 19 20   Temp: 97.9 F (36.6 C) 98.3 F (36.8 C) 97.7 F (36.5 C) 97.7 F (36.5 C)  TempSrc: Oral Oral Oral Oral  SpO2:  90% 91% 90%  Weight:      Height:        Wt Readings from Last 3 Encounters:  03/03/20 136.1 kg  12/01/19 (!) 149.5 kg  07/29/17 (!) 145.2 kg     Intake/Output Summary (Last 24 hours) at 03/05/2020  1056 Last data filed at 03/05/2020 0537 Gross per 24 hour  Intake 730 ml  Output 1500 ml  Net -770 ml     Physical Exam  Awake Alert, No new F.N deficits, Normal affect [place].AT,PERRAL Supple Neck,No JVD, No cervical lymphadenopathy appriciated.  Symmetrical Chest wall movement, Good air movement bilaterally, CTAB RRR,No Gallops,Rubs or new Murmurs, No Parasternal Heave +ve B.Sounds, Abd Soft, No tenderness, No organomegaly appriciated, No rebound - guarding or rigidity. No Cyanosis, Clubbing or edema, No new Rash or bruise      Data Review:    CBC Recent Labs  Lab 03/03/20 1526 03/05/20 0249  WBC 4.1 6.9  HGB 13.7 13.0  HCT 41.7 40.4  PLT 266 316  MCV 89.1 89.0  MCH 29.3 28.6  MCHC 32.9 32.2  RDW 14.6 14.3  LYMPHSABS 0.2* 0.2*  MONOABS 0.4 0.3  EOSABS 0.0 0.0  BASOSABS 0.0 0.0    Recent Labs  Lab 03/03/20 1526 03/04/20 1406 03/05/20 0249  NA 133* 138 138  K 3.3* 4.2 4.3  CL 96* 99 102  CO2 25 27 27   GLUCOSE 111* 130* 190*  BUN 17 17 22   CREATININE 1.11 0.94 0.97  CALCIUM 7.9* 8.2* 8.4*  AST 97* 102* 95*  ALT 65* 76* 85*  ALKPHOS 56 59 59  BILITOT 0.5 0.5 0.3  ALBUMIN 2.9* 2.6* 2.5*  MG  --   --  2.4  CRP 11.0*  --  6.1*  DDIMER 1.04*  --  0.72*  PROCALCITON <0.10  --   --   LATICACIDVEN 1.7  --   --   BNP 34.7  --   --     ------------------------------------------------------------------------------------------------------------------ Recent Labs    03/03/20 1526  TRIG 129    No results found for: HGBA1C ------------------------------------------------------------------------------------------------------------------ No results for input(s): TSH, T4TOTAL, T3FREE, THYROIDAB in the last 72 hours.  Invalid input(s): FREET3  Cardiac Enzymes No results for input(s): CKMB, TROPONINI, MYOGLOBIN in the last 168 hours.  Invalid input(s):  CK ------------------------------------------------------------------------------------------------------------------    Component Value Date/Time   BNP 34.7 03/03/2020 1526    Micro Results Recent Results (from the past 240 hour(s))  SARS Coronavirus 2 by RT PCR (hospital order, performed in [facility] hospital lab) Nasopharyngeal     Status: Abnormal   Collection  Time: 03/03/20  3:26 PM   Specimen: Nasopharyngeal  Result Value Ref Range Status   SARS Coronavirus 2 POSITIVE (A) NEGATIVE Final    Comment: RESULT CALLED TO, READ BACK BY AND VERIFIED WITH: MARVA SIMMS RN @1656  03/03/2020 OLSONM (NOTE) SARS-CoV-2 target nucleic acids are DETECTED  SARS-CoV-2 RNA is generally detectable in upper respiratory specimens  during the acute phase of infection.  Positive results are indicative  of the presence of the identified virus, but do not rule out bacterial infection or co-infection with other pathogens not detected by the test.  Clinical correlation with patient history and  other diagnostic information is necessary to determine patient infection status.  The expected result is negative.  Fact Sheet for Patients:   StrictlyIdeas.no   Fact Sheet for Healthcare Providers:   BankingDealers.co.za    This test is not yet approved or cleared by the [place] FDA and  has been authorized for detection and/or diagnosis of SARS-CoV-2 by FDA under an Emergency Use Authorization (EUA).  This EUA will remain in effect (meaning thi s test can be used) for the duration of  the COVID-19 declaration under Section 564(b)(1) of the Act, 21 U.S.C. section 360-bbb-3(b)(1), unless the authorization is terminated or revoked sooner.  Performed at [facility], [address] [place], [place] [address]   Blood Culture (routine x 2)     Status: None (Preliminary result)   Collection Time: 03/03/20  3:27 PM   Specimen: BLOOD LEFT ARM   Result Value Ref Range Status   Specimen Description   Final    BLOOD LEFT ARM Performed at [facility], [address] [place], [place] [address]    Special Requests   Final    BOTTLES DRAWN AEROBIC AND ANAEROBIC Blood Culture adequate volume Performed at [facility], [address] [place], [place] [address]    Culture   Final    NO GROWTH 2 DAYS Performed at [facility], [place] [address] [place], [place] [address]    Report Status PENDING  Incomplete  Blood Culture (routine x 2)     Status: None (Preliminary result)   Collection Time: 03/03/20  3:40 PM   Specimen: BLOOD LEFT HAND  Result Value Ref Range Status   Specimen Description   Final    BLOOD LEFT HAND Performed at [facility], [address] [place], [place] [address]    Special Requests   Final    BOTTLES DRAWN AEROBIC AND ANAEROBIC Blood Culture adequate volume Performed at [facility], [address] [place], [place] [address]    Culture   Final    NO GROWTH 2 DAYS Performed at [facility], [place] [address] [place], [place] [address]    Report Status PENDING  Incomplete    Radiology Reports DG Chest Port 1 View  Result Date: 03/05/2020 CLINICAL DATA:  COVID-19. EXAM: PORTABLE CHEST 1 VIEW COMPARISON:  March 03, 2020 FINDINGS: New hazy infiltrates are seen in the lungs, particularly the upper lobes. The cardiomediastinal silhouette is stable. No pneumothorax. No other acute abnormalities. IMPRESSION: New hazy infiltrates, particularly in the upper lobes, worrisome for developing COVID-19 pneumonia given history. Electronically Signed   By: Dorise Bullion III M.D   On: 03/05/2020 08:38   DG Chest Port 1 View  Result Date: 03/03/2020 CLINICAL DATA:  COVID-19 diagnosed 2 weeks ago, increasing shortness of breath, hypoxemia EXAM: PORTABLE  CHEST 1 VIEW COMPARISON:  None. FINDINGS: Single frontal view of the chest demonstrates an unremarkable cardiac  silhouette. Diffuse interstitial prominence is seen, with faint ground-glass opacities within the mid and lower lung zones. No effusion or pneumothorax. No acute bony abnormality. IMPRESSION: 1. Interstitial prominence with bilateral ground-glass airspace disease. Differential includes multifocal atypical pneumonia versus edema. Electronically Signed   By: Randa Ngo M.D.   On: 03/03/2020 15:43

## 2020-03-05 NOTE — Evaluation (Signed)
Physical Therapy Evaluation Patient Details Name: Michael Wolf MRN: [id number] DOB: 1955-11-14 Today's Date: 03/05/2020   History of Present Illness  64 y.o. male with medical history significant of hypertension, hyperlipidemia, non-Hodgkin's lymphoma in remission, and morbid obesity presents with complaints of shortness of breath and cough, apparently he was diagnosed outpatient with COVID-19 2 weeks prior. He is fully vaccinated and finished his vaccinations in February 2021. +COVID pneumonia with acute hypoxic respiratory failure.   Clinical Impression   Pt admitted with above diagnosis. Patient was independent PTA. He required 10L O2 via HFNC with ambulation x 80 ft with very slow pace and sats 87% HR94. He was educated on and return demonstrated use of flutter valve and incentive spirometer. May need education on use of DME and will need HEP.  Pt currently with functional limitations due to the deficits listed below (see PT Problem List). Pt will benefit from skilled PT to increase their independence and safety with mobility to allow discharge to the venue listed below.       Follow Up Recommendations Home health PT    Equipment Recommendations  None recommended by PT    Recommendations for Other Services OT consult     Precautions / Restrictions Precautions Precautions: Other (comment) Precaution Comments: monitor sats      Mobility  Bed Mobility                  Transfers Overall transfer level: Independent Equipment used: None             General transfer comment: sit to stand from recliner--reports he has been doing this throughout the day due to discomfort from sitting too long  Ambulation/Gait Ambulation/Gait assistance: Min guard Gait Distance (Feet): 80 Feet Assistive device: None Gait Pattern/deviations: Step-through pattern;Decreased stride length;Wide base of support   Gait velocity interpretation: <1.31 ft/sec, indicative of household  ambulator General Gait Details: pt self-selects very slow pace; encouraged wide BOS to improve balance  Stairs            Wheelchair Mobility    Modified Rankin (Stroke Patients Only)       Balance Overall balance assessment: No apparent balance deficits (not formally assessed)                                           Pertinent Vitals/Pain Pain Assessment: No/denies pain    Home Living Family/patient expects to be discharged to:: Private residence Living Arrangements: Spouse/significant other Available Help at Discharge: Family (wife recovering at home from [place]) Type of Home: House Home Access: Stairs to enter Entrance Stairs-Rails: None Entrance Stairs-Number of Steps: 1+1 Home Layout: One level Home Equipment: Environmental consultant - 2 wheels;Bedside commode;Shower seat Additional Comments: 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    Prior Function Level of Independence: Independent               Hand Dominance        Extremity/Trunk Assessment   Upper Extremity Assessment Upper Extremity Assessment: Defer to OT evaluation    Lower Extremity Assessment Lower Extremity Assessment: Generalized weakness    Cervical / Trunk Assessment Cervical / Trunk Assessment: Other exceptions Cervical / Trunk Exceptions: overweight  Communication   Communication: No difficulties  Cognition Arousal/Alertness: Awake/alert Behavior During Therapy: WFL for tasks assessed/performed Overall Cognitive Status: Within Functional Limits for tasks assessed  General Comments      Exercises Other Exercises Other Exercises: Educated on proper use of flutter valve (pt had not been using) and IS (pt has been doing 10x/hr and pulling 2000 ml   Assessment/Plan    PT Assessment Patient needs continued PT services  PT Problem List Decreased strength;Decreased activity tolerance;Decreased mobility;Decreased knowledge of use  of DME;Cardiopulmonary status limiting activity;Obesity       PT Treatment Interventions DME instruction;Gait training;Functional mobility training;Therapeutic activities;Therapeutic exercise;Balance training;Patient/family education    PT Goals (Current goals can be found in the Care Plan section)  Acute Rehab PT Goals Patient Stated Goal: go home PT Goal Formulation: With patient Time For Goal Achievement: 03/19/20 Potential to Achieve Goals: Good    Frequency Min 3X/week   Barriers to discharge        Co-evaluation               AM-PAC PT "6 Clicks" Mobility  Outcome Measure Help needed turning from your back to your side while in a flat bed without using bedrails?: None Help needed moving from lying on your back to sitting on the side of a flat bed without using bedrails?: A Little Help needed moving to and from a bed to a chair (including a wheelchair)?: None Help needed standing up from a chair using your arms (e.g., wheelchair or bedside chair)?: None Help needed to walk in hospital room?: A Little Help needed climbing 3-5 steps with a railing? : A Little 6 Click Score: 21    End of Session Equipment Utilized During Treatment: Oxygen Activity Tolerance: Treatment limited secondary to medical complications (Comment) (decr sats with activity) Patient left: in chair;with call bell/phone within reach   PT Visit Diagnosis: Difficulty in walking, not elsewhere classified (R26.2);Muscle weakness (generalized) (M62.81)    Time: 4562-5638 PT Time Calculation (min) (ACUTE ONLY): 33 min   Charges:   PT Evaluation $PT Eval Low Complexity: 1 Low PT Treatments $Therapeutic Exercise: 8-22 mins         Arby Barrette, PT Pager [phone]   Rexanne Mano 03/05/2020, 4:16 PM

## 2020-03-06 LAB — COMPREHENSIVE METABOLIC PANEL
ALT: 77 U/L — ABNORMAL HIGH (ref 0–44)
AST: 67 U/L — ABNORMAL HIGH (ref 15–41)
Albumin: 2.4 g/dL — ABNORMAL LOW (ref 3.5–5.0)
Alkaline Phosphatase: 54 U/L (ref 38–126)
Anion gap: 12 (ref 5–15)
BUN: 23 mg/dL (ref 8–23)
CO2: 28 mmol/L (ref 22–32)
Calcium: 8.5 mg/dL — ABNORMAL LOW (ref 8.9–10.3)
Chloride: 101 mmol/L (ref 98–111)
Creatinine, Ser: 1.09 mg/dL (ref 0.61–1.24)
GFR calc Af Amer: >60 mL/min (ref >60)
GFR calc non Af Amer: >60 mL/min (ref >60)
Glucose, Bld: 175 mg/dL — ABNORMAL HIGH (ref 70–99)
Potassium: 4.6 mmol/L (ref 3.5–5.1)
Sodium: 141 mmol/L (ref 135–145)
Total Bilirubin: 0.6 mg/dL (ref 0.3–1.2)
Total Protein: 5.4 g/dL — ABNORMAL LOW (ref 6.5–8.1)

## 2020-03-06 LAB — CBC WITH DIFFERENTIAL/PLATELET
Abs Immature Granulocytes: 0.08 10*3/uL — ABNORMAL HIGH (ref 0.00–0.07)
Basophils Absolute: 0 10*3/uL (ref 0.0–0.1)
Basophils Relative: 0 %
Eosinophils Absolute: 0 10*3/uL (ref 0.0–0.5)
Eosinophils Relative: 0 %
HCT: 40.3 % (ref 39.0–52.0)
Hemoglobin: 12.9 g/dL — ABNORMAL LOW (ref 13.0–17.0)
Immature Granulocytes: 1 %
Lymphocytes Relative: 2 %
Lymphs Abs: 0.2 10*3/uL — ABNORMAL LOW (ref 0.7–4.0)
MCH: 29.4 pg (ref 26.0–34.0)
MCHC: 32 g/dL (ref 30.0–36.0)
MCV: 91.8 fL (ref 80.0–100.0)
Monocytes Absolute: 0.4 10*3/uL (ref 0.1–1.0)
Monocytes Relative: 5 %
Neutro Abs: 7.7 10*3/uL (ref 1.7–7.7)
Neutrophils Relative %: 92 %
Platelets: 334 10*3/uL (ref 150–400)
RBC: 4.39 MIL/uL (ref 4.22–5.81)
RDW: 14.3 % (ref 11.5–15.5)
WBC: 8.3 10*3/uL (ref 4.0–10.5)
nRBC: 0 % (ref 0.0–0.2)

## 2020-03-06 LAB — MAGNESIUM: Magnesium: 2.3 mg/dL (ref 1.7–2.4)

## 2020-03-06 LAB — D-DIMER, QUANTITATIVE: D-Dimer, Quant: 0.47 ug/mL-FEU (ref 0.00–0.50)

## 2020-03-06 LAB — PROCALCITONIN: Procalcitonin: <0.1 ng/mL

## 2020-03-06 LAB — C-REACTIVE PROTEIN: CRP: 2.5 mg/dL — ABNORMAL HIGH (ref <1.0)

## 2020-03-06 LAB — BRAIN NATRIURETIC PEPTIDE: B Natriuretic Peptide: 118.9 pg/mL — ABNORMAL HIGH (ref 0.0–100.0)

## 2020-03-06 NOTE — Progress Notes (Signed)
Patient has been up in the chair for 4 hours.  He started on 10 liters nasal canula this morning at 93% oxygen saturation.   At this time he has came down to 4 liters nasal canula with 91% oxygen saturation. Patient has no complaints of discomfort or trouble breathing.  Will continue to monitor.

## 2020-03-06 NOTE — Progress Notes (Signed)
PROGRESS NOTE                                                                                                                                                                                                             Patient Demographics:    Michael Wolf, is a 64 y.o. male, DOB - 10/08/1955, BWG:[id number]  Outpatient Primary MD for the patient is [name], MD    LOS - 2  Admit date - 03/03/2020    Chief Complaint  Patient presents with  . Shortness of Breath       Brief Narrative - Michael Wolf is a 64 y.o. male with medical history significant of hypertension, hyperlipidemia, non-Hodgkin's lymphoma in remission, and morbid obesity presents with complaints of shortness of breath and cough, apparently he was diagnosed outpatient with COVID-19 initially 2 weeks ago.  He is fully vaccinated and finished his vaccinations in February 2021.  In the ER he was diagnosed with acute hypoxic respiratory failure due to COVID-19 pneumonia and was admitted to the hospital.   Subjective:   Patient in bed, appears comfortable, denies any headache, no fever, no chest pain or pressure, improved shortness of breath , no abdominal pain. No focal weakness.    Assessment  & Plan :     1. Acute Hypoxic Resp. Failure due to Acute Covid 19 Viral Pneumonitis during the ongoing 2020 Covid 19 Pandemic - he has severe parenchymal lung injury and has been appropriately placed on high-dose IV steroids, baricitinib and remdesivir in the ER itself.  This will be continued.  Will monitor closely.  Thankfully his CRP is now coming down still quite hypoxic we will continue to monitor closely.  Encouraged the patient to sit up in chair in the daytime use I-S and flutter valve for pulmonary toiletry and then prone in bed when at night.  Will advance activity and titrate down oxygen as possible.    SpO2: (!) 89 % O2 Flow Rate (L/min): 10  L/min  Recent Labs  Lab 03/03/20 1526 03/04/20 1406 03/05/20 0249 03/06/20 0426  WBC 4.1  --  6.9 8.3  PLT 266  --  316 334  CRP 11.0*  --  6.1* 2.5*  AST 97* 102* 95* 67*  ALT 65* 76* 85* 77*  ALKPHOS 56 59 59 54  BILITOT 0.5 0.5 0.3 0.6  ALBUMIN 2.9* 2.6* 2.5* 2.4*  DDIMER 1.04*  --  0.72* 0.47  PROCALCITON <0.10  --   --  <0.10  LATICACIDVEN 1.7  --   --   --   SARSCOV2NAA POSITIVE*  --   --   --     2.  Hypotension due to mild dehydration.  Hold home dose diuretics, improved after gentle IV fluids.  3. Dyslipidemia.  Continue home dose Crestor and Ezetimibe.  4.  Non-Hodgkin's lymphoma.  Supposedly in remission since 2018.  5.  Obesity.  BMI 40.  Follow with PCP for weight loss.     Condition - Extremely Guarded  Family Communication  :  wife [name] [phone] on 03/05/2020.  10:55 AM message left, 10/02/2019 at 10:07 AM and updated  Code Status :  Full  Consults  :  None  Procedures  :  None  PUD Prophylaxis : PPI  Disposition Plan  :    Status is: Inpatient  Remains inpatient appropriate because:IV treatments appropriate due to intensity of illness or inability to take PO and Inpatient level of care appropriate due to severity of illness   Dispo: The patient is from: Home              Anticipated d/c is to: Home              Anticipated d/c date is: > 3 days              Patient currently is not medically stable to d/c.   DVT Prophylaxis  :  Lovenox   Lab Results  Component Value Date   PLT 334 03/06/2020    Diet :  Diet Order            Diet regular Room service appropriate? Yes; Fluid consistency: Thin  Diet effective now                  Inpatient Medications  Scheduled Meds: . albuterol  2 puff Inhalation TID  . vitamin C  500 mg Oral Daily  . baricitinib  4 mg Oral Daily  . clonazePAM  0.5-1 mg Oral QHS  . docusate sodium  200 mg Oral BID  . enoxaparin (LOVENOX) injection  70 mg Subcutaneous Q24H  . ezetimibe  10 mg Oral  Daily  . feeding supplement (ENSURE ENLIVE)  237 mL Oral TID BM  . gabapentin  300 mg Oral Q12H  . loratadine  10 mg Oral Daily  . methylPREDNISolone (SOLU-MEDROL) injection  70 mg Intravenous Q12H  . pantoprazole  40 mg Oral Daily  . rosuvastatin  40 mg Oral QPM  . sodium chloride flush  3 mL Intravenous Q12H  . venlafaxine XR  75 mg Oral Q breakfast  . zinc sulfate  220 mg Oral Daily   Continuous Infusions: . remdesivir 100 mg in NS 100 mL 100 mg (03/06/20 0824)   PRN Meds:.acetaminophen, chlorpheniramine-HYDROcodone, guaiFENesin-dextromethorphan, magnesium hydroxide, [DISCONTINUED] ondansetron **OR** ondansetron (ZOFRAN) IV  Antibiotics  :    Anti-infectives (From admission, onward)   Start     Dose/Rate Route Frequency Ordered Stop   03/04/20 1000  remdesivir 100 mg in sodium chloride 0.9 % 100 mL IVPB        100 mg 200 mL/hr over 30 Minutes Intravenous Daily 03/03/20 1736 03/08/20 0959   03/03/20 1800  remdesivir 100 mg in sodium chloride 0.9 % 100 mL IVPB        100 mg 200 mL/hr over 30 Minutes Intravenous Every  30 min 03/03/20 1736 03/03/20 1934       Time Spent in minutes  30   Lala Lund M.D on 03/06/2020 at 10:04 AM  To page go to [email or web address] - password [email or web address]  [facility] -  Office  [phone]    See all Orders from today for further details    Objective:   Vitals:   03/05/20 1720 03/05/20 2036 03/06/20 0531 03/06/20 0810  BP: 107/60 (!) 103/56 104/64 (!) 116/58  Pulse: 86 88 80 82  Resp: 20 20 20 18   Temp: 98.7 F (37.1 C) 97.8 F (36.6 C) 97.8 F (36.6 C) 97.7 F (36.5 C)  TempSrc: Oral Oral Oral Oral  SpO2: 90% 91% 92% (!) 89%  Weight:      Height:        Wt Readings from Last 3 Encounters:  03/03/20 136.1 kg  12/01/19 (!) 149.5 kg  07/29/17 (!) 145.2 kg     Intake/Output Summary (Last 24 hours) at 03/06/2020 1004 Last data filed at 03/06/2020 0555 Gross per 24 hour  Intake 400 ml  Output 400 ml  Net 0 ml     Physical  Exam  Awake Alert, No new F.N deficits, Normal affect [place].AT,PERRAL Supple Neck,No JVD, No cervical lymphadenopathy appriciated.  Symmetrical Chest wall movement, Good air movement bilaterally, CTAB RRR,No Gallops, Rubs or new Murmurs, No Parasternal Heave +ve B.Sounds, Abd Soft, No tenderness, No organomegaly appriciated, No rebound - guarding or rigidity. No Cyanosis, Clubbing or edema, No new Rash or bruise    Data Review:    CBC Recent Labs  Lab 03/03/20 1526 03/05/20 0249 03/06/20 0426  WBC 4.1 6.9 8.3  HGB 13.7 13.0 12.9*  HCT 41.7 40.4 40.3  PLT 266 316 334  MCV 89.1 89.0 91.8  MCH 29.3 28.6 29.4  MCHC 32.9 32.2 32.0  RDW 14.6 14.3 14.3  LYMPHSABS 0.2* 0.2* 0.2*  MONOABS 0.4 0.3 0.4  EOSABS 0.0 0.0 0.0  BASOSABS 0.0 0.0 0.0    Recent Labs  Lab 03/03/20 1526 03/04/20 1406 03/05/20 0249 03/06/20 0426  NA 133* 138 138 141  K 3.3* 4.2 4.3 4.6  CL 96* 99 102 101  CO2 25 27 27 28   GLUCOSE 111* 130* 190* 175*  BUN 17 17 22 23   CREATININE 1.11 0.94 0.97 1.09  CALCIUM 7.9* 8.2* 8.4* 8.5*  AST 97* 102* 95* 67*  ALT 65* 76* 85* 77*  ALKPHOS 56 59 59 54  BILITOT 0.5 0.5 0.3 0.6  ALBUMIN 2.9* 2.6* 2.5* 2.4*  MG  --   --  2.4 2.3  CRP 11.0*  --  6.1* 2.5*  DDIMER 1.04*  --  0.72* 0.47  PROCALCITON <0.10  --   --  <0.10  LATICACIDVEN 1.7  --   --   --   BNP 34.7  --  26.7 118.9*    ------------------------------------------------------------------------------------------------------------------ Recent Labs    03/03/20 1526  TRIG 129    No results found for: HGBA1C ------------------------------------------------------------------------------------------------------------------ No results for input(s): TSH, T4TOTAL, T3FREE, THYROIDAB in the last 72 hours.  Invalid input(s): FREET3  Cardiac Enzymes No results for input(s): CKMB, TROPONINI, MYOGLOBIN in the last 168 hours.  Invalid input(s):  CK ------------------------------------------------------------------------------------------------------------------    Component Value Date/Time   BNP 118.9 (H) 03/06/2020 0426    Micro Results Recent Results (from the past 240 hour(s))  SARS Coronavirus 2 by RT PCR (hospital order, performed in [facility] hospital lab) Nasopharyngeal     Status: Abnormal   Collection  Time: 03/03/20  3:26 PM   Specimen: Nasopharyngeal  Result Value Ref Range Status   SARS Coronavirus 2 POSITIVE (A) NEGATIVE Final    Comment: RESULT CALLED TO, READ BACK BY AND VERIFIED WITH: MARVA SIMMS RN @1656  03/03/2020 OLSONM (NOTE) SARS-CoV-2 target nucleic acids are DETECTED  SARS-CoV-2 RNA is generally detectable in upper respiratory specimens  during the acute phase of infection.  Positive results are indicative  of the presence of the identified virus, but do not rule out bacterial infection or co-infection with other pathogens not detected by the test.  Clinical correlation with patient history and  other diagnostic information is necessary to determine patient infection status.  The expected result is negative.  Fact Sheet for Patients:   StrictlyIdeas.no   Fact Sheet for Healthcare Providers:   BankingDealers.co.za    This test is not yet approved or cleared by the [place] FDA and  has been authorized for detection and/or diagnosis of SARS-CoV-2 by FDA under an Emergency Use Authorization (EUA).  This EUA will remain in effect (meaning thi s test can be used) for the duration of  the COVID-19 declaration under Section 564(b)(1) of the Act, 21 U.S.C. section 360-bbb-3(b)(1), unless the authorization is terminated or revoked sooner.  Performed at [facility], [address] [place], [place] [address]   Blood Culture (routine x 2)     Status: None (Preliminary result)   Collection Time: 03/03/20  3:27 PM   Specimen: BLOOD LEFT  ARM  Result Value Ref Range Status   Specimen Description   Final    BLOOD LEFT ARM Performed at [facility], [address] [place], [place] [address]    Special Requests   Final    BOTTLES DRAWN AEROBIC AND ANAEROBIC Blood Culture adequate volume Performed at [facility], [address] [place], [place] [address]    Culture   Final    NO GROWTH 3 DAYS Performed at [facility], [place] [address] [place], [place] [address]    Report Status PENDING  Incomplete  Blood Culture (routine x 2)     Status: None (Preliminary result)   Collection Time: 03/03/20  3:40 PM   Specimen: BLOOD LEFT HAND  Result Value Ref Range Status   Specimen Description   Final    BLOOD LEFT HAND Performed at [facility], [address] [place], [place] [address]    Special Requests   Final    BOTTLES DRAWN AEROBIC AND ANAEROBIC Blood Culture adequate volume Performed at [facility], [address] [place], [place] [address]    Culture   Final    NO GROWTH 3 DAYS Performed at [facility], [place] [address] [place], [place] [address]    Report Status PENDING  Incomplete    Radiology Reports DG Chest Port 1 View  Result Date: 03/05/2020 CLINICAL DATA:  COVID-19. EXAM: PORTABLE CHEST 1 VIEW COMPARISON:  March 03, 2020 FINDINGS: New hazy infiltrates are seen in the lungs, particularly the upper lobes. The cardiomediastinal silhouette is stable. No pneumothorax. No other acute abnormalities. IMPRESSION: New hazy infiltrates, particularly in the upper lobes, worrisome for developing COVID-19 pneumonia given history. Electronically Signed   By: Dorise Bullion III M.D   On: 03/05/2020 08:38   DG Chest Port 1 View  Result Date: 03/03/2020 CLINICAL DATA:  COVID-19 diagnosed 2 weeks ago, increasing shortness of breath, hypoxemia EXAM: PORTABLE  CHEST 1 VIEW COMPARISON:  None. FINDINGS: Single frontal view of the chest demonstrates an unremarkable  cardiac silhouette. Diffuse interstitial prominence is seen, with faint ground-glass opacities within the mid and lower lung zones. No effusion or pneumothorax. No acute bony abnormality. IMPRESSION: 1. Interstitial prominence with bilateral ground-glass airspace disease. Differential includes multifocal atypical pneumonia versus edema. Electronically Signed   By: Randa Ngo M.D.   On: 03/03/2020 15:43

## 2020-03-06 NOTE — Progress Notes (Signed)
Patient is up in the chair eating dinner.  He is now on 2 liters nasal canula and his oxygen saturations are 92%.  Patient is tolerating 2 liters very well and has no discomfort.

## 2020-03-06 NOTE — Evaluation (Signed)
Occupational Therapy Evaluation Patient Details Name: Michael Wolf MRN: [id number] DOB: Jul 02, 1956 Today's Date: 03/06/2020    History of Present Illness 64 y.o. male with medical history significant of hypertension, hyperlipidemia, non-Hodgkin's lymphoma in remission, and morbid obesity presents with complaints of shortness of breath and cough, apparently he was diagnosed outpatient with COVID-19 2 weeks prior. He is fully vaccinated and finished his vaccinations in February 2021. +COVID pneumonia with acute hypoxic respiratory failure.    Clinical Impression   PTA, pt was living with his wife and was independent working full time as a Psychologist, sport and exercise. Pt currently performing ADLs at Supervision level for safety. Pt presenting with decreased activity tolerance as seen by fatigue and requiring rest breaks. Pt very motivated to return home and to PLOF. Pt would benefit from further acute OT to facilitate safe dc. Recommend dc to home with HHOT for further OT to optimize safety, independence with ADLs, and return to PLOF.     Follow Up Recommendations  Home health OT;Supervision/Assistance - 24 hour (Pending progress)    Equipment Recommendations  None recommended by OT    Recommendations for Other Services PT consult     Precautions / Restrictions Precautions Precautions: Other (comment) Precaution Comments: monitor sats      Mobility Bed Mobility                  Transfers Overall transfer level: Independent Equipment used: None             General transfer comment: sit to stand from recliner--reports he has been doing this throughout the day due to discomfort from sitting too long    Balance Overall balance assessment: No apparent balance deficits (not formally assessed)                                         ADL either performed or assessed with clinical judgement   ADL Overall ADL's : Needs assistance/impaired                                        General ADL Comments: Pt performing ADLs and functional mobility at Supervision level. Requiring increased time throguhout due to decreased activity tolerance.      Vision Baseline Vision/History: Wears glasses Wears Glasses: Reading only Patient Visual Report: No change from baseline       Perception     Praxis      Pertinent Vitals/Pain Pain Assessment: No/denies pain     Hand Dominance Right   Extremity/Trunk Assessment Upper Extremity Assessment Upper Extremity Assessment: Generalized weakness   Lower Extremity Assessment Lower Extremity Assessment: Defer to PT evaluation   Cervical / Trunk Assessment Cervical / Trunk Assessment: Other exceptions Cervical / Trunk Exceptions: overweight   Communication Communication Communication: No difficulties   Cognition Arousal/Alertness: Awake/alert Behavior During Therapy: WFL for tasks assessed/performed Overall Cognitive Status: Within Functional Limits for tasks assessed                                     General Comments  SpO2 88% on 4L with monitor on finger. Changing O2 monitor to earlobe, SPo2 97% on 4L at rest. RR 17. HR 107. With activity, SpO2 >95% on 4L. RR max 30.  HR 110s. Placing pt on 3L at end of session and notified RN.     Exercises Exercises: Other exercises Other Exercises Other Exercises: Educating pt on benefits of prone position in bed. Pt verbalized understanding   Shoulder Instructions      Home Living Family/patient expects to be discharged to:: Private residence Living Arrangements: Spouse/significant other Available Help at Discharge: Family (wife recovering at home from [place]) Type of Home: House Home Access: Stairs to enter Technical brewer of Steps: 1+1 Entrance Stairs-Rails: None Home Layout: One level     Bathroom Shower/Tub: Teacher, early years/pre: Standard     Home Equipment: Environmental consultant - 2 wheels;Bedside commode;Shower  seat   Additional Comments: 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      Prior Functioning/Environment Level of Independence: Independent        Comments: "I am a farmer"        OT Problem List: Decreased strength;Decreased range of motion;Decreased activity tolerance;Impaired balance (sitting and/or standing);Decreased knowledge of use of DME or AE;Decreased knowledge of precautions;Cardiopulmonary status limiting activity      OT Treatment/Interventions: Self-care/ADL training;Therapeutic exercise;Energy conservation;DME and/or AE instruction;Therapeutic activities;Patient/family education    OT Goals(Current goals can be found in the care plan section) Acute Rehab OT Goals 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 OT Goal Formulation: With patient Time For Goal Achievement: 03/20/20 Potential to Achieve Goals: Good  OT Frequency: Min 2X/week   Barriers to D/C:            Co-evaluation              AM-PAC OT "6 Clicks" Daily Activity     Outcome Measure Help from another person eating meals?: A Little Help from another person taking care of personal grooming?: A Little Help from another person toileting, which includes using toliet, bedpan, or urinal?: A Little Help from another person bathing (including washing, rinsing, drying)?: A Little Help from another person to put on and taking off regular upper body clothing?: A Little Help from another person to put on and taking off regular lower body clothing?: A Little 6 Click Score: 18   End of Session Equipment Utilized During Treatment: Oxygen (4L) Nurse Communication: Mobility status  Activity Tolerance: Patient tolerated treatment well Patient left: in chair;with call bell/phone within reach  OT Visit Diagnosis: Unsteadiness on feet (R26.81);Other abnormalities of gait and mobility (R26.89);Muscle weakness (generalized) (M62.81)                Time: 4917-9150 OT Time Calculation (min): 27 min Charges:  OT  General Charges $OT Visit: 1 Visit OT Evaluation $OT Eval Moderate Complexity: 1 Mod OT Treatments $Self Care/Home Management : 8-22 mins  Michael Wolf MSOT, OTR/L Acute Rehab Pager: [phone] Office: [place] 03/06/2020, 5:36 PM

## 2020-03-07 LAB — PROCALCITONIN: Procalcitonin: <0.1 ng/mL

## 2020-03-07 LAB — COMPREHENSIVE METABOLIC PANEL
ALT: 74 U/L — ABNORMAL HIGH (ref 0–44)
AST: 51 U/L — ABNORMAL HIGH (ref 15–41)
Albumin: 2.7 g/dL — ABNORMAL LOW (ref 3.5–5.0)
Alkaline Phosphatase: 54 U/L (ref 38–126)
Anion gap: 11 (ref 5–15)
BUN: 21 mg/dL (ref 8–23)
CO2: 29 mmol/L (ref 22–32)
Calcium: 8.2 mg/dL — ABNORMAL LOW (ref 8.9–10.3)
Chloride: 101 mmol/L (ref 98–111)
Creatinine, Ser: 0.95 mg/dL (ref 0.61–1.24)
GFR calc Af Amer: >60 mL/min (ref >60)
GFR calc non Af Amer: >60 mL/min (ref >60)
Glucose, Bld: 212 mg/dL — ABNORMAL HIGH (ref 70–99)
Potassium: 4.4 mmol/L (ref 3.5–5.1)
Sodium: 141 mmol/L (ref 135–145)
Total Bilirubin: 0.6 mg/dL (ref 0.3–1.2)
Total Protein: 5.6 g/dL — ABNORMAL LOW (ref 6.5–8.1)

## 2020-03-07 LAB — CBC WITH DIFFERENTIAL/PLATELET
Abs Immature Granulocytes: 0.11 10*3/uL — ABNORMAL HIGH (ref 0.00–0.07)
Basophils Absolute: 0 10*3/uL (ref 0.0–0.1)
Basophils Relative: 0 %
Eosinophils Absolute: 0 10*3/uL (ref 0.0–0.5)
Eosinophils Relative: 0 %
HCT: 41.3 % (ref 39.0–52.0)
Hemoglobin: 13.1 g/dL (ref 13.0–17.0)
Immature Granulocytes: 1 %
Lymphocytes Relative: 2 %
Lymphs Abs: 0.2 10*3/uL — ABNORMAL LOW (ref 0.7–4.0)
MCH: 29 pg (ref 26.0–34.0)
MCHC: 31.7 g/dL (ref 30.0–36.0)
MCV: 91.6 fL (ref 80.0–100.0)
Monocytes Absolute: 0.5 10*3/uL (ref 0.1–1.0)
Monocytes Relative: 5 %
Neutro Abs: 9.3 10*3/uL — ABNORMAL HIGH (ref 1.7–7.7)
Neutrophils Relative %: 92 %
Platelets: 356 10*3/uL (ref 150–400)
RBC: 4.51 MIL/uL (ref 4.22–5.81)
RDW: 14.1 % (ref 11.5–15.5)
WBC: 10.1 10*3/uL (ref 4.0–10.5)
nRBC: 0 % (ref 0.0–0.2)

## 2020-03-07 LAB — BRAIN NATRIURETIC PEPTIDE: B Natriuretic Peptide: 68 pg/mL (ref 0.0–100.0)

## 2020-03-07 LAB — C-REACTIVE PROTEIN: CRP: 1.2 mg/dL — ABNORMAL HIGH (ref <1.0)

## 2020-03-07 LAB — D-DIMER, QUANTITATIVE: D-Dimer, Quant: 0.5 ug/mL-FEU (ref 0.00–0.50)

## 2020-03-07 LAB — MAGNESIUM: Magnesium: 2.3 mg/dL (ref 1.7–2.4)

## 2020-03-07 NOTE — Progress Notes (Signed)
Physical Therapy Treatment Patient Details Name: Michael Wolf MRN: [id number] DOB: 06/18/1956 Today's Date: 03/07/2020    History of Present Illness Pt is a 64 y.o. male admitted 03/03/20 with SOB and cough, dx with COVID-19 two weeks prior; pt was fully vaccinnated 08/2019. Workup for acute hypoxic respiratory failure secondary to  COVID PNA. PMH includes HTN, HLD, obesity, non-Hodgkin's lymphoma in remission.   PT Comments    Pt progressing well with mobility. Maintaining SpO2 96% on 2L O2 at rest, >/92% on 4L O2 with ambulation. Able to increase ambulation distance with minimal rest breaks. Remains limited by decreased activity tolerance. Encouraged more frequent bouts of activity throughout day. Will continue to follow acutely.   Follow Up Recommendations  Home health PT     Equipment Recommendations  None recommended by PT    Recommendations for Other Services       Precautions / Restrictions Precautions Precautions: Fall Restrictions Weight Bearing Restrictions: No    Mobility  Bed Mobility               General bed mobility comments: Received sitting in recliner  Transfers Overall transfer level: Independent Equipment used: None                Ambulation/Gait Ambulation/Gait assistance: Supervision;Min guard Gait Distance (Feet): 270 Feet Assistive device: None Gait Pattern/deviations: Step-through pattern;Decreased stride length;Wide base of support Gait velocity: Decreased   General Gait Details: Slow, mildly unsteady gait without DME, pt often reaching to hallway rail for added stability, reports slower pace because "I want to take it easy." SpO2>/92% on 4L O2 [place]   Stairs             Wheelchair Mobility    Modified Rankin (Stroke Patients Only)       Balance Overall balance assessment: Needs assistance   Sitting balance-Leahy Scale: Good       Standing balance-Leahy Scale: Good                               Cognition Arousal/Alertness: Awake/alert Behavior During Therapy: WFL for tasks assessed/performed Overall Cognitive Status: Within Functional Limits for tasks assessed                                        Exercises      General Comments General comments (skin integrity, edema, etc.): SpO2 96% on 2L at rest, >/92% on 4L walking      Pertinent Vitals/Pain Pain Assessment: Faces Faces Pain Scale: Hurts a little bit Pain Location: Lower back Pain Descriptors / Indicators: Discomfort Pain Intervention(s): Monitored during session    Home Living                      Prior Function            PT Goals (current goals can now be found in the care plan section) Progress towards PT goals: Progressing toward goals    Frequency    Min 3X/week      PT Plan Current plan remains appropriate    Co-evaluation              AM-PAC PT "6 Clicks" Mobility   Outcome Measure  Help needed turning from your back to your side while in a flat bed without using bedrails?: None Help needed moving from  lying on your back to sitting on the side of a flat bed without using bedrails?: None Help needed moving to and from a bed to a chair (including a wheelchair)?: None Help needed standing up from a chair using your arms (e.g., wheelchair or bedside chair)?: None Help needed to walk in hospital room?: A Little Help needed climbing 3-5 steps with a railing? : A Little 6 Click Score: 22    End of Session Equipment Utilized During Treatment: Oxygen Activity Tolerance: Patient tolerated treatment well Patient left: in chair;with call bell/phone within reach Nurse Communication: Mobility status PT Visit Diagnosis: Difficulty in walking, not elsewhere classified (R26.2);Muscle weakness (generalized) (M62.81)     Time: 3794-3276 PT Time Calculation (min) (ACUTE ONLY): 18 min  Charges:  $Therapeutic Exercise: 8-22 mins                    Mabeline Caras, PT,  DPT Acute Rehabilitation Services  Pager [phone] Office [place] 03/07/2020, 12:56 PM

## 2020-03-07 NOTE — TOC Initial Note (Signed)
Transition of Care [facility]) - Initial/Assessment Note    Patient Details  Name: Michael Wolf MRN: [id number] Date of Birth: [birth year]  Transition of Care [facility]) CM/SW Contact:    Carles Collet, RN Phone Number: 03/07/2020, 3:49 PM  Clinical Narrative:           Damaris Schooner w patient over the phone, deferred to wife. Called wife, discussed DC plan. She is agreeable to [facility], states they have used [facility] in the past, referral made, will need [place] orders. Patient has CPAP at home through [place], and wife has ordered new CPAP supplies. She is agreeable to any DME provider for home, especially if he is able to DC on 3L or less and use can use a POC through [employer].  No other DME needs. TOC requested ambulatory sats from RN, will continue to follow for home oxygen needs. Wife shares that she works full time, is also recovering from [place], and patient's mother died recently (was on hospice care) and funeral was yesterday.  She will be available to transport him home at DC        Expected Discharge Plan: [place] Barriers to Discharge: Continued Medical Work up   Patient Goals and CMS Choice Patient states their goals for this hospitalization and ongoing recovery are:: to go home CMS Medicare.gov Compare Post Acute Care list provided to:: Other (Comment Required) Choice offered to / list presented to : Spouse  Expected Discharge Plan and Services Expected Discharge Plan: [place]   Discharge Planning Services: CM Consult   Living arrangements for the past 2 months: Single Family Home                 DME Arranged: N/A         HH Arranged: PT, OT, RN [place] Agency: [place] ([facility]) Date HH Agency Contacted: 03/07/20 Time [place]: [place] Representative spoke with at [place]: Butch Penny  Prior Living Arrangements/Services Living arrangements for the past 2 months: Single Family Home Lives with:: Spouse Patient language and need for  interpreter reviewed:: Yes Do you feel safe going back to the place where you live?: Yes            Criminal Activity/Legal Involvement Pertinent to Current Situation/Hospitalization: No - Comment as needed  Activities of Daily Living Home Assistive Devices/Equipment: None ADL Screening (condition at time of admission) Patient's cognitive ability adequate to safely complete daily activities?: Yes Is the patient deaf or have difficulty hearing?: No Does the patient have difficulty seeing, even when wearing glasses/contacts?: No Does the patient have difficulty concentrating, remembering, or making decisions?: No Patient able to express need for assistance with ADLs?: No Does the patient have difficulty dressing or bathing?: No Independently performs ADLs?: Yes (appropriate for developmental age) Does the patient have difficulty walking or climbing stairs?: No Weakness of Legs: None Weakness of Arms/Hands: None  Permission Sought/Granted                  Emotional Assessment              Admission diagnosis:  Acute respiratory failure ([place]) [J96.00] Pneumonia [J18.9] Acute respiratory failure with hypoxia ([place]) [J96.01] Multifocal pneumonia [J18.9] Pneumonia due to COVID-19 virus [U07.1, J12.82] COVID-19 [U07.1] Patient Active Problem List   Diagnosis Date Noted  . Transient hypotension 03/04/2020  . Hypokalemia 03/04/2020  . Hyponatremia 03/04/2020  . Obesity, Class III, BMI 40-49.9 (morbid obesity) ([place]) 03/04/2020  . Non Hodgkin's  lymphoma ([place])   . Acute respiratory failure ([place]) 03/03/2020  . Pneumonia due to COVID-19 virus 03/03/2020   PCP:  [name], MD Pharmacy:   [facility] [place] [place] [address] [place] [address] Phone: [phone] Fax: [phone]     Social Determinants of Health (SDOH) Interventions    Readmission Risk Interventions No flowsheet data found.

## 2020-03-07 NOTE — Progress Notes (Signed)
PROGRESS NOTE                                                                                                                                                                                                             Patient Demographics:    Khiem Gargis, is a 64 y.o. male, DOB - 10/12/1955, DJT:[id number]  Outpatient Primary MD for the patient is [name], MD    LOS - 3  Admit date - 03/03/2020    Chief Complaint  Patient presents with  . Shortness of Breath       Brief Narrative - Todrick Siedschlag is a 64 y.o. male with medical history significant of hypertension, hyperlipidemia, non-Hodgkin's lymphoma in remission, and morbid obesity presents with complaints of shortness of breath and cough, apparently he was diagnosed outpatient with COVID-19 initially 2 weeks ago.  He is fully vaccinated and finished his vaccinations in February 2021.  In the ER he was diagnosed with acute hypoxic respiratory failure due to COVID-19 pneumonia and was admitted to the hospital.   Subjective:   Patient in bed, appears comfortable, denies any headache, no fever, no chest pain or pressure, no shortness of breath , no abdominal pain. No focal weakness.   Assessment  & Plan :     1. Acute Hypoxic Resp. Failure due to Acute Covid 19 Viral Pneumonitis during the ongoing 2020 Covid 19 Pandemic - he has severe parenchymal lung injury and has been appropriately placed on high-dose IV steroids, baricitinib and remdesivir in the ER itself.  This will be continued.  Will monitor closely.  Thankfully his CRP is now coming down still quite hypoxic we will continue to monitor closely.  Encouraged the patient to sit up in chair in the daytime use I-S and flutter valve for pulmonary toiletry and then prone in bed when at night.  Will advance activity and titrate down oxygen as possible.    SpO2: 94 % O2 Flow Rate (L/min): 1 L/min  Recent Labs   Lab 03/03/20 1526 03/04/20 1406 03/05/20 0249 03/06/20 0426 03/07/20 1014  WBC 4.1  --  6.9 8.3 10.1  PLT 266  --  316 334 356  CRP 11.0*  --  6.1* 2.5*  --   AST 97* 102* 95* 67*  --   ALT 65* 76* 85* 77*  --  ALKPHOS 56 59 59 54  --   BILITOT 0.5 0.5 0.3 0.6  --   ALBUMIN 2.9* 2.6* 2.5* 2.4*  --   DDIMER 1.04*  --  0.72* 0.47 0.50  PROCALCITON <0.10  --   --  <0.10  --   LATICACIDVEN 1.7  --   --   --   --   SARSCOV2NAA POSITIVE*  --   --   --   --     2.  Hypotension due to mild dehydration.  Hold home dose diuretics, improved after gentle IV fluids.  3. Dyslipidemia.  Continue home dose Crestor and Ezetimibe.  4.  Non-Hodgkin's lymphoma.  Supposedly in remission since 2018.  5.  Obesity.  BMI 40.  Follow with PCP for weight loss.     Condition - Extremely Guarded  Family Communication  :  wife [name] [phone] on 03/05/2020.  10:55 AM message left, 10/02/2019 at 10:07 AM and updated, updated again on 03/07/2020.  Also talked to patient's hematologist 03/07/2020 Dr. Donzetta Matters from [facility]  [phone], nothing to add.   Code Status :  Full  Consults  :  None  Procedures  :  None  PUD Prophylaxis : PPI  Disposition Plan  :    Status is: Inpatient  Remains inpatient appropriate because:IV treatments appropriate due to intensity of illness or inability to take PO and Inpatient level of care appropriate due to severity of illness   Dispo: The patient is from: Home              Anticipated d/c is to: Home              Anticipated d/c date is: > 3 days              Patient currently is not medically stable to d/c.   DVT Prophylaxis  :  Lovenox   Lab Results  Component Value Date   PLT 356 03/07/2020    Diet :  Diet Order            Diet regular Room service appropriate? Yes; Fluid consistency: Thin  Diet effective now                  Inpatient Medications  Scheduled Meds: . albuterol  2 puff Inhalation TID  . vitamin C  500 mg Oral  Daily  . baricitinib  4 mg Oral Daily  . clonazePAM  0.5-1 mg Oral QHS  . docusate sodium  200 mg Oral BID  . enoxaparin (LOVENOX) injection  70 mg Subcutaneous Q24H  . ezetimibe  10 mg Oral Daily  . feeding supplement (ENSURE ENLIVE)  237 mL Oral TID BM  . gabapentin  300 mg Oral Q12H  . loratadine  10 mg Oral Daily  . methylPREDNISolone (SOLU-MEDROL) injection  70 mg Intravenous Q12H  . pantoprazole  40 mg Oral Daily  . rosuvastatin  40 mg Oral QPM  . sodium chloride flush  3 mL Intravenous Q12H  . venlafaxine XR  75 mg Oral Q breakfast  . zinc sulfate  220 mg Oral Daily   Continuous Infusions:  PRN Meds:.acetaminophen, chlorpheniramine-HYDROcodone, guaiFENesin-dextromethorphan, magnesium hydroxide, [DISCONTINUED] ondansetron **OR** ondansetron (ZOFRAN) IV  Antibiotics  :    Anti-infectives (From admission, onward)   Start     Dose/Rate Route Frequency Ordered Stop   03/04/20 1000  remdesivir 100 mg in sodium chloride 0.9 % 100 mL IVPB        100 mg 200 mL/hr over  30 Minutes Intravenous Daily 03/03/20 1736 03/07/20 0857   03/03/20 1800  remdesivir 100 mg in sodium chloride 0.9 % 100 mL IVPB        100 mg 200 mL/hr over 30 Minutes Intravenous Every 30 min 03/03/20 1736 03/03/20 1934       Time Spent in minutes  30   Lala Lund M.D on 03/07/2020 at 11:21 AM  To page go to [email or web address] - password [email or web address]  [facility] -  Office  [phone]    See all Orders from today for further details    Objective:   Vitals:   03/06/20 0810 03/06/20 1632 03/06/20 2055 03/07/20 0502  BP: (!) 116/58 116/67 130/63 (!) 150/75  Pulse: 82 92 75 82  Resp: 18 (!) 24 16 18   Temp: 97.7 F (36.5 C) 97.6 F (36.4 C) 97.8 F (36.6 C) 98.1 F (36.7 C)  TempSrc: Oral Oral Oral Oral  SpO2: (!) 89% 96% 91% 94%  Weight:      Height:        Wt Readings from Last 3 Encounters:  03/03/20 136.1 kg  12/01/19 (!) 149.5 kg  07/29/17 (!) 145.2 kg     Intake/Output Summary (Last  24 hours) at 03/07/2020 1121 Last data filed at 03/07/2020 0614 Gross per 24 hour  Intake 350 ml  Output 900 ml  Net -550 ml     Physical Exam  Awake Alert, No new F.N deficits, Normal affect [place].AT,PERRAL Supple Neck,No JVD, No cervical lymphadenopathy appriciated.  Symmetrical Chest wall movement, Good air movement bilaterally, CTAB RRR,No Gallops, Rubs or new Murmurs, No Parasternal Heave +ve B.Sounds, Abd Soft, No tenderness, No organomegaly appriciated, No rebound - guarding or rigidity. No Cyanosis, Clubbing or edema, No new Rash or bruise    Data Review:    CBC Recent Labs  Lab 03/03/20 1526 03/05/20 0249 03/06/20 0426 03/07/20 1014  WBC 4.1 6.9 8.3 10.1  HGB 13.7 13.0 12.9* 13.1  HCT 41.7 40.4 40.3 41.3  PLT 266 316 334 356  MCV 89.1 89.0 91.8 91.6  MCH 29.3 28.6 29.4 29.0  MCHC 32.9 32.2 32.0 31.7  RDW 14.6 14.3 14.3 14.1  LYMPHSABS 0.2* 0.2* 0.2* 0.2*  MONOABS 0.4 0.3 0.4 0.5  EOSABS 0.0 0.0 0.0 0.0  BASOSABS 0.0 0.0 0.0 0.0    Recent Labs  Lab 03/03/20 1526 03/04/20 1406 03/05/20 0249 03/06/20 0426 03/07/20 1014  NA 133* 138 138 141  --   K 3.3* 4.2 4.3 4.6  --   CL 96* 99 102 101  --   CO2 25 27 27 28   --   GLUCOSE 111* 130* 190* 175*  --   BUN 17 17 22 23   --   CREATININE 1.11 0.94 0.97 1.09  --   CALCIUM 7.9* 8.2* 8.4* 8.5*  --   AST 97* 102* 95* 67*  --   ALT 65* 76* 85* 77*  --   ALKPHOS 56 59 59 54  --   BILITOT 0.5 0.5 0.3 0.6  --   ALBUMIN 2.9* 2.6* 2.5* 2.4*  --   MG  --   --  2.4 2.3  --   CRP 11.0*  --  6.1* 2.5*  --   DDIMER 1.04*  --  0.72* 0.47 0.50  PROCALCITON <0.10  --   --  <0.10  --   LATICACIDVEN 1.7  --   --   --   --   BNP 34.7  --  26.7 118.9*  --     ------------------------------------------------------------------------------------------------------------------  No results for input(s): CHOL, HDL, LDLCALC, TRIG, CHOLHDL, LDLDIRECT in the last 72 hours.  No results found for:  HGBA1C ------------------------------------------------------------------------------------------------------------------ No results for input(s): TSH, T4TOTAL, T3FREE, THYROIDAB in the last 72 hours.  Invalid input(s): FREET3  Cardiac Enzymes No results for input(s): CKMB, TROPONINI, MYOGLOBIN in the last 168 hours.  Invalid input(s): CK ------------------------------------------------------------------------------------------------------------------    Component Value Date/Time   BNP 118.9 (H) 03/06/2020 0426    Micro Results Recent Results (from the past 240 hour(s))  SARS Coronavirus 2 by RT PCR (hospital order, performed in [facility] hospital lab) Nasopharyngeal     Status: Abnormal   Collection Time: 03/03/20  3:26 PM   Specimen: Nasopharyngeal  Result Value Ref Range Status   SARS Coronavirus 2 POSITIVE (A) NEGATIVE Final    Comment: RESULT CALLED TO, READ BACK BY AND VERIFIED WITH: MARVA SIMMS RN @1656  03/03/2020 OLSONM (NOTE) SARS-CoV-2 target nucleic acids are DETECTED  SARS-CoV-2 RNA is generally detectable in upper respiratory specimens  during the acute phase of infection.  Positive results are indicative  of the presence of the identified virus, but do not rule out bacterial infection or co-infection with other pathogens not detected by the test.  Clinical correlation with patient history and  other diagnostic information is necessary to determine patient infection status.  The expected result is negative.  Fact Sheet for Patients:   StrictlyIdeas.no   Fact Sheet for Healthcare Providers:   BankingDealers.co.za    This test is not yet approved or cleared by the [place] FDA and  has been authorized for detection and/or diagnosis of SARS-CoV-2 by FDA under an Emergency Use Authorization (EUA).  This EUA will remain in effect (meaning thi s test can be used) for the duration of  the COVID-19 declaration  under Section 564(b)(1) of the Act, 21 U.S.C. section 360-bbb-3(b)(1), unless the authorization is terminated or revoked sooner.  Performed at [facility], [address] [place], [place] [address]   Blood Culture (routine x 2)     Status: None (Preliminary result)   Collection Time: 03/03/20  3:27 PM   Specimen: BLOOD LEFT ARM  Result Value Ref Range Status   Specimen Description   Final    BLOOD LEFT ARM Performed at [facility], [address] [place], [place] [address]    Special Requests   Final    BOTTLES DRAWN AEROBIC AND ANAEROBIC Blood Culture adequate volume Performed at [facility], [address] [place], [place] [address]    Culture   Final    NO GROWTH 4 DAYS Performed at [facility], [place] [address] [place], [place] [address]    Report Status PENDING  Incomplete  Blood Culture (routine x 2)     Status: None (Preliminary result)   Collection Time: 03/03/20  3:40 PM   Specimen: BLOOD LEFT HAND  Result Value Ref Range Status   Specimen Description   Final    BLOOD LEFT HAND Performed at [facility], [address] [place], [place] [address]    Special Requests   Final    BOTTLES DRAWN AEROBIC AND ANAEROBIC Blood Culture adequate volume Performed at [facility], [address] [place], [place] [address]    Culture   Final    NO GROWTH 4 DAYS Performed at [facility], [place] [address] [place], [place] [address]    Report Status PENDING  Incomplete    Radiology Reports DG Chest Port 1 View  Result Date: 03/05/2020 CLINICAL DATA:  COVID-19. EXAM: PORTABLE CHEST 1 VIEW COMPARISON:  March 03, 2020 FINDINGS: New hazy infiltrates are seen in the lungs, particularly the upper lobes. The cardiomediastinal silhouette is stable. No pneumothorax. No other acute abnormalities. IMPRESSION: New hazy infiltrates, particularly in the upper lobes, worrisome for developing COVID-19 pneumonia given  history. Electronically Signed   By: Dorise Bullion III M.D   On: 03/05/2020 08:38   DG Chest Port 1 View  Result Date: 03/03/2020 CLINICAL DATA:  COVID-19 diagnosed 2 weeks ago, increasing shortness of breath, hypoxemia EXAM: PORTABLE CHEST 1 VIEW COMPARISON:  None. FINDINGS: Single frontal view of the chest demonstrates an unremarkable cardiac silhouette. Diffuse interstitial prominence is seen, with faint ground-glass opacities within the mid and lower lung zones. No effusion or pneumothorax. No acute bony abnormality. IMPRESSION: 1. Interstitial prominence with bilateral ground-glass airspace disease. Differential includes multifocal atypical pneumonia versus edema. Electronically Signed   By: Randa Ngo M.D.   On: 03/03/2020 15:43

## 2020-03-08 LAB — CBC WITH DIFFERENTIAL/PLATELET
Abs Immature Granulocytes: 0.09 10*3/uL — ABNORMAL HIGH (ref 0.00–0.07)
Basophils Absolute: 0 10*3/uL (ref 0.0–0.1)
Basophils Relative: 0 %
Eosinophils Absolute: 0 10*3/uL (ref 0.0–0.5)
Eosinophils Relative: 0 %
HCT: 42.4 % (ref 39.0–52.0)
Hemoglobin: 13.7 g/dL (ref 13.0–17.0)
Immature Granulocytes: 1 %
Lymphocytes Relative: 1 %
Lymphs Abs: 0.1 10*3/uL — ABNORMAL LOW (ref 0.7–4.0)
MCH: 29.3 pg (ref 26.0–34.0)
MCHC: 32.3 g/dL (ref 30.0–36.0)
MCV: 90.8 fL (ref 80.0–100.0)
Monocytes Absolute: 0.3 10*3/uL (ref 0.1–1.0)
Monocytes Relative: 4 %
Neutro Abs: 9.1 10*3/uL — ABNORMAL HIGH (ref 1.7–7.7)
Neutrophils Relative %: 94 %
Platelets: 375 10*3/uL (ref 150–400)
RBC: 4.67 MIL/uL (ref 4.22–5.81)
RDW: 14.3 % (ref 11.5–15.5)
WBC: 9.6 10*3/uL (ref 4.0–10.5)
nRBC: 0 % (ref 0.0–0.2)

## 2020-03-08 LAB — COMPREHENSIVE METABOLIC PANEL
ALT: 70 U/L — ABNORMAL HIGH (ref 0–44)
AST: 43 U/L — ABNORMAL HIGH (ref 15–41)
Albumin: 2.8 g/dL — ABNORMAL LOW (ref 3.5–5.0)
Alkaline Phosphatase: 62 U/L (ref 38–126)
Anion gap: 14 (ref 5–15)
BUN: 21 mg/dL (ref 8–23)
CO2: 25 mmol/L (ref 22–32)
Calcium: 8.1 mg/dL — ABNORMAL LOW (ref 8.9–10.3)
Chloride: 100 mmol/L (ref 98–111)
Creatinine, Ser: 0.98 mg/dL (ref 0.61–1.24)
GFR calc Af Amer: >60 mL/min (ref >60)
GFR calc non Af Amer: >60 mL/min (ref >60)
Glucose, Bld: 252 mg/dL — ABNORMAL HIGH (ref 70–99)
Potassium: 4.1 mmol/L (ref 3.5–5.1)
Sodium: 139 mmol/L (ref 135–145)
Total Bilirubin: 1 mg/dL (ref 0.3–1.2)
Total Protein: 5.7 g/dL — ABNORMAL LOW (ref 6.5–8.1)

## 2020-03-08 LAB — MAGNESIUM: Magnesium: 2.2 mg/dL (ref 1.7–2.4)

## 2020-03-08 LAB — GLUCOSE, CAPILLARY
Glucose-Capillary: 121 mg/dL — ABNORMAL HIGH (ref 70–99)
Glucose-Capillary: 146 mg/dL — ABNORMAL HIGH (ref 70–99)

## 2020-03-08 LAB — CULTURE, BLOOD (ROUTINE X 2)
Culture: NO GROWTH
Culture: NO GROWTH
Special Requests: ADEQUATE
Special Requests: ADEQUATE

## 2020-03-08 LAB — HEMOGLOBIN A1C
Hgb A1c MFr Bld: 6.9 % — ABNORMAL HIGH (ref 4.8–5.6)
Mean Plasma Glucose: 151.33 mg/dL

## 2020-03-08 LAB — BRAIN NATRIURETIC PEPTIDE: B Natriuretic Peptide: 51.3 pg/mL (ref 0.0–100.0)

## 2020-03-08 LAB — D-DIMER, QUANTITATIVE: D-Dimer, Quant: 0.47 ug/mL-FEU (ref 0.00–0.50)

## 2020-03-08 LAB — PROCALCITONIN: Procalcitonin: <0.1 ng/mL

## 2020-03-08 LAB — C-REACTIVE PROTEIN: CRP: 1.3 mg/dL — ABNORMAL HIGH (ref <1.0)

## 2020-03-08 MED ORDER — INSULIN ASPART 100 UNIT/ML ~~LOC~~ SOLN: 0.0000 [IU] | Freq: Every day | SUBCUTANEOUS | Status: DC

## 2020-03-08 MED ORDER — INSULIN ASPART 100 UNIT/ML ~~LOC~~ SOLN: 0.0000 [IU] | Freq: Three times a day (TID) | SUBCUTANEOUS | Status: DC

## 2020-03-08 MED ORDER — LINAGLIPTIN 5 MG PO TABS
5.0000 mg | ORAL_TABLET | Freq: Every day | ORAL | Status: DC
  Administered 2020-03-08 – 2020-03-09 (×2): 5 mg via ORAL
  Filled 2020-03-08 (×2): qty 1

## 2020-03-08 NOTE — Progress Notes (Signed)
SATURATION QUALIFICATIONS: (This note is used to comply with regulatory documentation for home oxygen)  Patient Saturations on Room Air at Rest = 90%  Patient Saturations on Room Air while Ambulating = 86%  Patient Saturations on 2 Liters of oxygen while Ambulating = 91%  Please briefly explain why patient needs home oxygen:with activities, sat O2 dropped to 86% on RA and HR increased to 110.

## 2020-03-08 NOTE — TOC Progression Note (Addendum)
Transition of Care [facility]) - Progression Note    Patient Details  Name: Mohab Ashby MRN: [id number] Date of Birth: 1956/07/14  [facility] CM/SW Contact  Carles Collet, RN Phone Number: 03/08/2020, 11:49 AM  Clinical Narrative:    Damaris Schooner w wife over the phone to discuss home oxygen.  Oxygen ordered through [employer], home concentrator to be delivered to the house today.  16:00 Confirmed w [employer] that home oxygen has been delivered to the house.    Expected Discharge Plan: [place] Barriers to Discharge: Continued Medical Work up  Expected Discharge Plan and Services Expected Discharge Plan: [place]   Discharge Planning Services: CM Consult   Living arrangements for the past 2 months: Single Family Home                 DME Arranged: Oxygen DME Agency: [facility] Date DME Agency Contacted: 03/08/20 Time DME Agency Contacted: [phone] Representative spoke with at DME Agency: [place]: PT, OT, RN [place] Agency: [place] ([place]) Date [place]: 03/07/20 Time [place]: [place] Representative spoke with at [place]: [place] ([place]) Interventions    Readmission Risk Interventions No flowsheet data found.

## 2020-03-08 NOTE — Progress Notes (Addendum)
PROGRESS NOTE                                                                                                                                                                                                             Patient Demographics:    Michael Wolf, is a 64 y.o. male, DOB - [birth year], QPY:[id number]  Outpatient Primary MD for the patient is [name], MD    LOS - 4  Admit date - 03/03/2020    Chief Complaint  Patient presents with  . Shortness of Breath       Brief Narrative - Michael Wolf is a 64 y.o. male with medical history significant of hypertension, hyperlipidemia, non-Hodgkin's lymphoma in remission, and morbid obesity presents with complaints of shortness of breath and cough, apparently he was diagnosed outpatient with COVID-19 initially 2 weeks ago.  He is fully vaccinated and finished his vaccinations in February 2021.  In the ER he was diagnosed with acute hypoxic respiratory failure due to COVID-19 pneumonia and was admitted to the hospital.   Subjective:   Patient in bed, appears comfortable, denies any headache, no fever, no chest pain or pressure, no shortness of breath , no abdominal pain. No focal weakness.   Assessment  & Plan :     1. Acute Hypoxic Resp. Failure due to Acute Covid 19 Viral Pneumonitis during the ongoing 2020 Covid 19 Pandemic - he has severe parenchymal lung injury and has been appropriately placed on high-dose IV steroids, baricitinib and remdesivir in the ER itself.  This will be continued.  Will monitor closely.  Thankfully his CRP is now coming down, he is with improved oxygen requirement at rest, he remains on 4 to 6 L nasal cannula with activity.   Encouraged the patient to sit up in chair in the daytime use I-S and flutter valve for pulmonary toiletry and then prone in bed when at night.  Will advance activity and titrate down oxygen as possible.    SpO2: 95  % O2 Flow Rate (L/min): 2 L/min  Recent Labs  Lab 03/03/20 1526 03/03/20 1526 03/04/20 1406 03/05/20 0249 03/06/20 0426 03/07/20 1014 03/08/20 0934  WBC 4.1  --   --  6.9 8.3 10.1 9.6  PLT 266  --   --  316 334 356 375  CRP 11.0*  --   --  6.1* 2.5* 1.2* 1.3*  AST 97*   < > 102* 95* 67* 51* 43*  ALT 65*   < > 76* 85* 77* 74* 70*  ALKPHOS 56   < > 59 59 54 54 62  BILITOT 0.5   < > 0.5 0.3 0.6 0.6 1.0  ALBUMIN 2.9*   < > 2.6* 2.5* 2.4* 2.7* 2.8*  DDIMER 1.04*  --   --  0.72* 0.47 0.50 0.47  PROCALCITON <0.10  --   --   --  <0.10 <0.10 <0.10  LATICACIDVEN 1.7  --   --   --   --   --   --   SARSCOV2NAA POSITIVE*  --   --   --   --   --   --    < > = values in this interval not displayed.    2.  Hypotension due to mild dehydration.  Hold home dose diuretics, improved after gentle IV fluids.  3. Dyslipidemia.  Continue home dose Crestor and Ezetimibe.  4.  Non-Hodgkin's lymphoma.  Supposedly in remission since 2018.  5.  Obesity.  BMI 40.  Follow with PCP for weight loss.  Hyperglycemia -due to steroids, check A1c, start on Tradjenta and sliding scale.     Condition - Extremely Guarded  Family communication: Wife updated by phone  Code Status :  Full  Consults  :  None  Procedures  :  None  PUD Prophylaxis : PPI  Disposition Plan  :    Status is: Inpatient  Remains inpatient appropriate because:IV treatments appropriate due to intensity of illness or inability to take PO and Inpatient level of care appropriate due to severity of illness   Dispo: The patient is from: Home              Anticipated d/c is to: Home              Anticipated d/c date is: 1 day              Patient currently is not medically stable to d/c.   DVT Prophylaxis  :  Lovenox   Lab Results  Component Value Date   PLT 375 03/08/2020    Diet :  Diet Order            Diet regular Room service appropriate? Yes; Fluid consistency: Thin  Diet effective now                   Inpatient Medications  Scheduled Meds: . albuterol  2 puff Inhalation TID  . vitamin C  500 mg Oral Daily  . baricitinib  4 mg Oral Daily  . clonazePAM  0.5-1 mg Oral QHS  . docusate sodium  200 mg Oral BID  . enoxaparin (LOVENOX) injection  70 mg Subcutaneous Q24H  . ezetimibe  10 mg Oral Daily  . feeding supplement (ENSURE ENLIVE)  237 mL Oral TID BM  . gabapentin  300 mg Oral Q12H  . loratadine  10 mg Oral Daily  . methylPREDNISolone (SOLU-MEDROL) injection  70 mg Intravenous Q12H  . pantoprazole  40 mg Oral Daily  . rosuvastatin  40 mg Oral QPM  . sodium chloride flush  3 mL Intravenous Q12H  . venlafaxine XR  75 mg Oral Q breakfast  . zinc sulfate  220 mg Oral Daily   Continuous Infusions:  PRN Meds:.acetaminophen, chlorpheniramine-HYDROcodone, guaiFENesin-dextromethorphan, magnesium hydroxide, [DISCONTINUED] ondansetron **OR** ondansetron (ZOFRAN) IV  Antibiotics  :    Anti-infectives (From  admission, onward)   Start     Dose/Rate Route Frequency Ordered Stop   03/04/20 1000  remdesivir 100 mg in sodium chloride 0.9 % 100 mL IVPB        100 mg 200 mL/hr over 30 Minutes Intravenous Daily 03/03/20 1736 03/07/20 0857   03/03/20 1800  remdesivir 100 mg in sodium chloride 0.9 % 100 mL IVPB        100 mg 200 mL/hr over 30 Minutes Intravenous Every 30 min 03/03/20 1736 03/03/20 1934        Markeem Noreen M.D on 03/08/2020 at 1:04 PM  To page go to [email or web address]   [facility] -  Office  [phone]    See all Orders from today for further details    Objective:   Vitals:   03/07/20 1500 03/07/20 1611 03/07/20 2045 03/08/20 0451  BP:  118/75 136/68 125/71  Pulse: 93  87 87  Resp: 18 20 (!) 21 (!) 21  Temp:  98.5 F (36.9 C) 98.3 F (36.8 C) 97.8 F (36.6 C)  TempSrc:  Oral Oral Oral  SpO2: 96%  92% 95%  Weight:      Height:        Wt Readings from Last 3 Encounters:  03/03/20 136.1 kg  12/01/19 (!) 149.5 kg  07/29/17 (!) 145.2 kg      Intake/Output Summary (Last 24 hours) at 03/08/2020 1304 Last data filed at 03/08/2020 2094 Gross per 24 hour  Intake 1230 ml  Output 2100 ml  Net -870 ml     Physical Exam  Awake Alert, Oriented X 3, No new F.N deficits, Normal affect Symmetrical Chest wall movement, Good air movement bilaterally, CTAB RRR,No Gallops,Rubs or new Murmurs, No Parasternal Heave +ve B.Sounds, Abd Soft, No tenderness, No rebound - guarding or rigidity. No Cyanosis, Clubbing or edema, No new Rash or bruise        Data Review:    CBC Recent Labs  Lab 03/03/20 1526 03/05/20 0249 03/06/20 0426 03/07/20 1014 03/08/20 0934  WBC 4.1 6.9 8.3 10.1 9.6  HGB 13.7 13.0 12.9* 13.1 13.7  HCT 41.7 40.4 40.3 41.3 42.4  PLT 266 316 334 356 375  MCV 89.1 89.0 91.8 91.6 90.8  MCH 29.3 28.6 29.4 29.0 29.3  MCHC 32.9 32.2 32.0 31.7 32.3  RDW 14.6 14.3 14.3 14.1 14.3  LYMPHSABS 0.2* 0.2* 0.2* 0.2* 0.1*  MONOABS 0.4 0.3 0.4 0.5 0.3  EOSABS 0.0 0.0 0.0 0.0 0.0  BASOSABS 0.0 0.0 0.0 0.0 0.0    Recent Labs  Lab 03/03/20 1526 03/03/20 1526 03/04/20 1406 03/05/20 0249 03/06/20 0426 03/07/20 1014 03/08/20 0934  NA 133*   < > 138 138 141 141 139  K 3.3*   < > 4.2 4.3 4.6 4.4 4.1  CL 96*   < > 99 102 101 101 100  CO2 25   < > 27 27 28 29 25   GLUCOSE 111*   < > 130* 190* 175* 212* 252*  BUN 17   < > 17 22 23 21 21   CREATININE 1.11   < > 0.94 0.97 1.09 0.95 0.98  CALCIUM 7.9*   < > 8.2* 8.4* 8.5* 8.2* 8.1*  AST 97*   < > 102* 95* 67* 51* 43*  ALT 65*   < > 76* 85* 77* 74* 70*  ALKPHOS 56   < > 59 59 54 54 62  BILITOT 0.5   < > 0.5 0.3 0.6 0.6 1.0  ALBUMIN 2.9*   < >  2.6* 2.5* 2.4* 2.7* 2.8*  MG  --   --   --  2.4 2.3 2.3 2.2  CRP 11.0*  --   --  6.1* 2.5* 1.2* 1.3*  DDIMER 1.04*  --   --  0.72* 0.47 0.50 0.47  PROCALCITON <0.10  --   --   --  <0.10 <0.10 <0.10  LATICACIDVEN 1.7  --   --   --   --   --   --   BNP 34.7  --   --  26.7 118.9* 68.0 51.3   < > = values in this interval not  displayed.    ------------------------------------------------------------------------------------------------------------------ No results for input(s): CHOL, HDL, LDLCALC, TRIG, CHOLHDL, LDLDIRECT in the last 72 hours.  No results found for: HGBA1C ------------------------------------------------------------------------------------------------------------------ No results for input(s): TSH, T4TOTAL, T3FREE, THYROIDAB in the last 72 hours.  Invalid input(s): FREET3  Cardiac Enzymes No results for input(s): CKMB, TROPONINI, MYOGLOBIN in the last 168 hours.  Invalid input(s): CK ------------------------------------------------------------------------------------------------------------------    Component Value Date/Time   BNP 51.3 03/08/2020 0934    Micro Results Recent Results (from the past 240 hour(s))  SARS Coronavirus 2 by RT PCR (hospital order, performed in [facility] hospital lab) Nasopharyngeal     Status: Abnormal   Collection Time: 03/03/20  3:26 PM   Specimen: Nasopharyngeal  Result Value Ref Range Status   SARS Coronavirus 2 POSITIVE (A) NEGATIVE Final    Comment: RESULT CALLED TO, READ BACK BY AND VERIFIED WITH: MARVA SIMMS RN @1656  03/03/2020 OLSONM (NOTE) SARS-CoV-2 target nucleic acids are DETECTED  SARS-CoV-2 RNA is generally detectable in upper respiratory specimens  during the acute phase of infection.  Positive results are indicative  of the presence of the identified virus, but do not rule out bacterial infection or co-infection with other pathogens not detected by the test.  Clinical correlation with patient history and  other diagnostic information is necessary to determine patient infection status.  The expected result is negative.  Fact Sheet for Patients:   StrictlyIdeas.no   Fact Sheet for Healthcare Providers:   BankingDealers.co.za    This test is not yet approved or cleared by the [place] FDA and  has been authorized for detection and/or diagnosis of SARS-CoV-2 by FDA under an Emergency Use Authorization (EUA).  This EUA will remain in effect (meaning thi s test can be used) for the duration of  the COVID-19 declaration under Section 564(b)(1) of the Act, 21 U.S.C. section 360-bbb-3(b)(1), unless the authorization is terminated or revoked sooner.  Performed at [facility], [address] [place], [place] [address]   Blood Culture (routine x 2)     Status: None   Collection Time: 03/03/20  3:27 PM   Specimen: BLOOD LEFT ARM  Result Value Ref Range Status   Specimen Description   Final    BLOOD LEFT ARM Performed at [facility], [address] [place], [place] [address]    Special Requests   Final    BOTTLES DRAWN AEROBIC AND ANAEROBIC Blood Culture adequate volume Performed at [facility], [address] [place], [place] [address]    Culture   Final    NO GROWTH 5 DAYS Performed at [facility], [place] [address], [place], [place] [address]    Report Status 03/08/2020 FINAL  Final  Blood Culture (routine x 2)     Status: None   Collection Time: 03/03/20  3:40 PM  Specimen: BLOOD LEFT HAND  Result Value Ref Range Status   Specimen Description   Final    BLOOD LEFT HAND Performed at [facility], [address] [place], [place] [address]    Special Requests   Final    BOTTLES DRAWN AEROBIC AND ANAEROBIC Blood Culture adequate volume Performed at [facility], [address] [place], [place] [address]    Culture   Final    NO GROWTH 5 DAYS Performed at [facility], [place] [address] [place], [place] [address]    Report Status 03/08/2020 FINAL  Final    Radiology Reports DG Chest Port 1 View  Result Date: 03/05/2020 CLINICAL DATA:  COVID-19. EXAM: PORTABLE CHEST 1 VIEW COMPARISON:  March 03, 2020 FINDINGS: New hazy infiltrates are seen in the lungs, particularly the upper  lobes. The cardiomediastinal silhouette is stable. No pneumothorax. No other acute abnormalities. IMPRESSION: New hazy infiltrates, particularly in the upper lobes, worrisome for developing COVID-19 pneumonia given history. Electronically Signed   By: Dorise Bullion III M.D   On: 03/05/2020 08:38   DG Chest Port 1 View  Result Date: 03/03/2020 CLINICAL DATA:  COVID-19 diagnosed 2 weeks ago, increasing shortness of breath, hypoxemia EXAM: PORTABLE CHEST 1 VIEW COMPARISON:  None. FINDINGS: Single frontal view of the chest demonstrates an unremarkable cardiac silhouette. Diffuse interstitial prominence is seen, with faint ground-glass opacities within the mid and lower lung zones. No effusion or pneumothorax. No acute bony abnormality. IMPRESSION: 1. Interstitial prominence with bilateral ground-glass airspace disease. Differential includes multifocal atypical pneumonia versus edema. Electronically Signed   By: Randa Ngo M.D.   On: 03/03/2020 15:43

## 2020-03-09 LAB — GLUCOSE, CAPILLARY
Glucose-Capillary: 102 mg/dL — ABNORMAL HIGH (ref 70–99)
Glucose-Capillary: 114 mg/dL — ABNORMAL HIGH (ref 70–99)

## 2020-03-09 LAB — BRAIN NATRIURETIC PEPTIDE: B Natriuretic Peptide: 83 pg/mL (ref 0.0–100.0)

## 2020-03-09 LAB — PROCALCITONIN: Procalcitonin: <0.1 ng/mL

## 2020-03-09 MED ORDER — PANTOPRAZOLE SODIUM 40 MG PO TBEC: 40.0000 mg | DELAYED_RELEASE_TABLET | Freq: Every day | ORAL | 0 refills | Status: AC

## 2020-03-09 MED ORDER — DEXAMETHASONE 6 MG PO TABS: 6.0000 mg | ORAL_TABLET | Freq: Every day | ORAL | 0 refills | Status: AC

## 2020-03-09 MED ORDER — TRIAMTERENE-HCTZ 37.5-25 MG PO TABS: 1.0000 | ORAL_TABLET | Freq: Every day | ORAL | Status: AC

## 2020-03-09 MED ORDER — METFORMIN HCL 500 MG PO TABS: 500.0000 mg | ORAL_TABLET | Freq: Two times a day (BID) | ORAL | 0 refills | Status: AC

## 2020-03-09 MED ORDER — FUROSEMIDE 40 MG PO TABS
40.0000 mg | ORAL_TABLET | Freq: Once | ORAL | Status: AC
  Administered 2020-03-09: 40 mg via ORAL
  Filled 2020-03-09: qty 1

## 2020-03-09 NOTE — Discharge Instructions (Signed)
Person Under Monitoring Name: Michael Wolf  Location: [place] [id number]   Infection Prevention Recommendations for Individuals Confirmed to have, or Being Evaluated for, 2019 Novel Coronavirus (COVID-19) Infection Who Receive Care at Home  Individuals who are confirmed to have, or are being evaluated for, COVID-19 should follow the prevention steps below until a healthcare provider or local or state health department says they can return to normal activities.  Stay home except to get medical care You should restrict activities outside your home, except for getting medical care. Do not go to work, school, or public areas, and do not use public transportation or taxis.  Call ahead before visiting your doctor Before your medical appointment, call the healthcare provider and tell them that you have, or are being evaluated for, COVID-19 infection. This will help the healthcare provider's office take steps to keep other people from getting infected. Ask your healthcare provider to call the local or state health department.  Monitor your symptoms Seek prompt medical attention if your illness is worsening (e.g., difficulty breathing). Before going to your medical appointment, call the healthcare provider and tell them that you have, or are being evaluated for, COVID-19 infection. Ask your healthcare provider to call the local or state health department.  Wear a facemask You should wear a facemask that covers your nose and mouth when you are in the same room with other people and when you visit a healthcare provider. People who live with or visit you should also wear a facemask while they are in the same room with you.  Separate yourself from other people in your home As much as possible, you should stay in a different room from other people in your home. Also, you should use a separate bathroom, if available.  Avoid sharing household items You should  not share dishes, drinking glasses, cups, eating utensils, towels, bedding, or other items with other people in your home. After using these items, you should wash them thoroughly with soap and water.  Cover your coughs and sneezes Cover your mouth and nose with a tissue when you cough or sneeze, or you can cough or sneeze into your sleeve. Throw used tissues in a lined trash can, and immediately wash your hands with soap and water for at least 20 seconds or use an alcohol-based hand rub.  Wash your [facility] your hands often and thoroughly with soap and water for at least 20 seconds. You can use an alcohol-based hand sanitizer if soap and water are not available and if your hands are not visibly dirty. Avoid touching your eyes, nose, and mouth with unwashed hands.   Prevention Steps for Caregivers and Household Members of Individuals Confirmed to have, or Being Evaluated for, COVID-19 Infection Being Cared for in the Home  If you live with, or provide care at home for, a person confirmed to have, or being evaluated for, COVID-19 infection please follow these guidelines to prevent infection:  Follow healthcare provider's instructions Make sure that you understand and can help the patient follow any healthcare provider instructions for all care.  Provide for the patient's basic needs You should help the patient with basic needs in the home and provide support for getting groceries, prescriptions, and other personal needs.  Monitor the patient's symptoms If they are getting sicker, call his or her medical provider and tell them that the patient has, or is being evaluated for, COVID-19 infection. This will help the healthcare provider's  office take steps to keep other people from getting infected. Ask the healthcare provider to call the local or state health department.  Limit the number of people who have contact with the patient  If possible, have only one caregiver for the  patient.  Other household members should stay in another home or place of residence. If this is not possible, they should stay  in another room, or be separated from the patient as much as possible. Use a separate bathroom, if available.  Restrict visitors who do not have an essential need to be in the home.  Keep older adults, very young children, and other sick people away from the patient Keep older adults, very young children, and those who have compromised immune systems or chronic health conditions away from the patient. This includes people with chronic heart, lung, or kidney conditions, diabetes, and cancer.  Ensure good ventilation Make sure that shared spaces in the home have good air flow, such as from an air conditioner or an opened window, weather permitting.  Wash your hands often  Wash your hands often and thoroughly with soap and water for at least 20 seconds. You can use an alcohol based hand sanitizer if soap and water are not available and if your hands are not visibly dirty.  Avoid touching your eyes, nose, and mouth with unwashed hands.  Use disposable paper towels to dry your hands. If not available, use dedicated cloth towels and replace them when they become wet.  Wear a facemask and gloves  Wear a disposable facemask at all times in the room and gloves when you touch or have contact with the patient's blood, body fluids, and/or secretions or excretions, such as sweat, saliva, sputum, nasal mucus, vomit, urine, or feces.  Ensure the mask fits over your nose and mouth tightly, and do not touch it during use.  Throw out disposable facemasks and gloves after using them. Do not reuse.  Wash your hands immediately after removing your facemask and gloves.  If your personal clothing becomes contaminated, carefully remove clothing and launder. Wash your hands after handling contaminated clothing.  Place all used disposable facemasks, gloves, and other waste in a lined  container before disposing them with other household waste.  Remove gloves and wash your hands immediately after handling these items.  Do not share dishes, glasses, or other household items with the patient  Avoid sharing household items. You should not share dishes, drinking glasses, cups, eating utensils, towels, bedding, or other items with a patient who is confirmed to have, or being evaluated for, COVID-19 infection.  After the person uses these items, you should wash them thoroughly with soap and water.  Wash laundry thoroughly  Immediately remove and wash clothes or bedding that have blood, body fluids, and/or secretions or excretions, such as sweat, saliva, sputum, nasal mucus, vomit, urine, or feces, on them.  Wear gloves when handling laundry from the patient.  Read and follow directions on labels of laundry or clothing items and detergent. In general, wash and dry with the warmest temperatures recommended on the label.  Clean all areas the individual has used often  Clean all touchable surfaces, such as counters, tabletops, doorknobs, bathroom fixtures, toilets, phones, keyboards, tablets, and bedside tables, every day. Also, clean any surfaces that may have blood, body fluids, and/or secretions or excretions on them.  Wear gloves when cleaning surfaces the patient has come in contact with.  Use a diluted bleach solution (e.g., dilute bleach with 1  part bleach and 10 parts water) or a household disinfectant with a label that says EPA-registered for coronaviruses. To make a bleach solution at home, add 1 tablespoon of bleach to 1 quart (4 cups) of water. For a larger supply, add  cup of bleach to 1 gallon (16 cups) of water.  Read labels of cleaning products and follow recommendations provided on product labels. Labels contain instructions for safe and effective use of the cleaning product including precautions you should take when applying the product, such as wearing gloves or  eye protection and making sure you have good ventilation during use of the product.  Remove gloves and wash hands immediately after cleaning.  Monitor yourself for signs and symptoms of illness Caregivers and household members are considered close contacts, should monitor their health, and will be asked to limit movement outside of the home to the extent possible. Follow the monitoring steps for close contacts listed on the symptom monitoring form.   ? If you have additional questions, contact your local health department or call the epidemiologist on call at [phone] (available 24/7). ? This guidance is subject to change. For the most up-to-date guidance from [facility], please refer to their website: YouBlogs.pl

## 2020-03-09 NOTE — Progress Notes (Signed)
Occupational Therapy Treatment Patient Details Name: Michael Wolf MRN: [id number] DOB: [birth year] Today's Date: 03/09/2020    History of present illness Pt is a 64 y.o. male admitted 03/03/20 with SOB and cough, dx with COVID-19 two weeks prior; pt was fully vaccinnated 08/2019. Workup for acute hypoxic respiratory failure secondary to  COVID PNA. PMH includes HTN, HLD, obesity, non-Hodgkin's lymphoma in remission.   OT comments  Pt seen for OT follow up session to focus on BADL mobility progression, ECS skill implementation, and preparation for d/c home. Issued pt ECS education packet and reviewed applicable points with pt. Reviewed how to self monitor SpO2 sats at home. Pt on 2L [place] at start of session, requires 4L [place] for mobility and OOB BADL. RN informed of these values. Pt continues to require mod VC's to initiate rest breaks and to pursed lip breathe during activity. Updated recs, no post acute OT needed at this time. Will continue to follow while acute.    Follow Up Recommendations  No OT follow up    Equipment Recommendations  None recommended by OT    Recommendations for Other Services      Precautions / Restrictions Precautions Precautions: Fall Restrictions Weight Bearing Restrictions: No       Mobility Bed Mobility               General bed mobility comments: in chair  Transfers Overall transfer level: Independent Equipment used: None                  Balance Overall balance assessment: Needs assistance   Sitting balance-Leahy Scale: Good       Standing balance-Leahy Scale: Good                             ADL either performed or assessed with clinical judgement   ADL Overall ADL's : Needs assistance/impaired                                       General ADL Comments: Pt performing ADLs and functional mobility at Supervision level. Requiring increased time throguhout due to decreased activity tolerance.       Vision Baseline Vision/History: Wears glasses Wears Glasses: Reading only Patient Visual Report: No change from baseline     Perception     Praxis      Cognition Arousal/Alertness: Awake/alert Behavior During Therapy: WFL for tasks assessed/performed Overall Cognitive Status: Within Functional Limits for tasks assessed                                          Exercises Other Exercises Other Exercises: Educated on ECS excersises, IS and flutter   Shoulder Instructions       General Comments Discussed activity recommendations (walking program - shorter bouts, more frequently), current O2 needs, SpO2 monitoring (pt owns pulse ox), pursed lip breathing    Pertinent Vitals/ Pain       Pain Assessment: Faces Faces Pain Scale: Hurts a little bit Pain Location: Lower back Pain Descriptors / Indicators: Tiring Pain Intervention(s): Monitored during session  Home Living  Prior Functioning/Environment              Frequency  Min 2X/week        Progress Toward Goals  OT Goals(current goals can now be found in the care plan section)  Progress towards OT goals: Progressing toward goals  Acute Rehab OT Goals 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 OT Goal Formulation: With patient Time For Goal Achievement: 03/20/20 Potential to Achieve Goals: Good  Plan Discharge plan remains appropriate    Co-evaluation                 AM-PAC OT "6 Clicks" Daily Activity     Outcome Measure   Help from another person eating meals?: A Little Help from another person taking care of personal grooming?: A Little Help from another person toileting, which includes using toliet, bedpan, or urinal?: A Little Help from another person bathing (including washing, rinsing, drying)?: A Little Help from another person to put on and taking off regular upper body clothing?: A Little Help from  another person to put on and taking off regular lower body clothing?: A Little 6 Click Score: 18    End of Session Equipment Utilized During Treatment: Oxygen (2L rest, 4L walk)  OT Visit Diagnosis: Unsteadiness on feet (R26.81);Other abnormalities of gait and mobility (R26.89);Muscle weakness (generalized) (M62.81)   Activity Tolerance Patient tolerated treatment well   Patient Left in chair;with call bell/phone within reach   Nurse Communication Mobility status        Time: 6438-3818 OT Time Calculation (min): 24 min  Charges: OT General Charges $OT Visit: 1 Visit OT Treatments $Self Care/Home Management : 23-37 mins  Zenovia Jarred, MSOT, OTR/L [facility] Office Number: [phone] Pager: [phone]  Zenovia Jarred 03/09/2020, 12:51 PM

## 2020-03-09 NOTE — Care Management Important Message (Signed)
Important Message  Patient Details  Name: Michael Wolf MRN: [id number] Date of Birth: September 21, 1955   Medicare Important Message Given:  Yes - Important Message mailed due to current National Emergency  Verbal consent obtained due to current National Emergency  Relationship to patient: Self Contact Name: Camil Wilhelmsen Call Date: 03/09/20  Time: 1201 Phone: [phone] Outcome: Spoke with contact Important Message mailed to: Patient address on file    [place] 03/09/2020, 12:01 PM

## 2020-03-09 NOTE — Progress Notes (Signed)
Physical Therapy Treatment Patient Details Name: Michael Wolf MRN: [id number] DOB: December 29, 1955 Today's Date: 03/09/2020    History of Present Illness Pt is a 64 y.o. male admitted 03/03/20 with SOB and cough, dx with COVID-19 two weeks prior; pt was fully vaccinnated 08/2019. Workup for acute hypoxic respiratory failure secondary to  COVID PNA. PMH includes HTN, HLD, obesity, non-Hodgkin's lymphoma in remission.   PT Comments    Pt progressing well with mobility; motivated for d/c home today. SpO2 maintaining >/93% on 2L O2 at rest; SpO2 86-90% on 3L O2 [place] with ambulation. Discussed activity recommendations (walking program - shorter bouts, more frequently), current O2 needs, SpO2 monitoring (pt owns pulse ox), pursed lip breathing, O2 tanke use. Pt has no further questions or concerns.     Follow Up Recommendations  Home health PT     Equipment Recommendations  None recommended by PT    Recommendations for Other Services       Precautions / Restrictions Precautions Precautions: Fall Restrictions Weight Bearing Restrictions: No    Mobility  Bed Mobility               General bed mobility comments: Received sitting in recliner  Transfers Overall transfer level: Independent Equipment used: None                Ambulation/Gait Ambulation/Gait assistance: Supervision Gait Distance (Feet): 150 Feet Assistive device: None Gait Pattern/deviations: Step-through pattern;Decreased stride length;Wide base of support Gait velocity: Decreased   General Gait Details: Slow, guarded gait without DME; pt not reaching for UE support this time; supervision for safety/lines/O2 tank. SpO2 briefly down to 86% on 3L O2, maintaining 87-90% on 3L while walking   Stairs             Wheelchair Mobility    Modified Rankin (Stroke Patients Only)       Balance Overall balance assessment: Needs assistance   Sitting balance-Leahy Scale: Good       Standing  balance-Leahy Scale: Good                              Cognition Arousal/Alertness: Awake/alert Behavior During Therapy: WFL for tasks assessed/performed Overall Cognitive Status: Within Functional Limits for tasks assessed                                        Exercises      General Comments General comments (skin integrity, edema, etc.): Discussed activity recommendations (walking program - shorter bouts, more frequently), current O2 needs, SpO2 monitoring (pt owns pulse ox), pursed lip breathing      Pertinent Vitals/Pain Pain Assessment: Faces Faces Pain Scale: Hurts a little bit Pain Location: Lower back Pain Descriptors / Indicators: Tiring Pain Intervention(s): Monitored during session    Home Living                      Prior Function            PT Goals (current goals can now be found in the care plan section) Progress towards PT goals: Progressing toward goals    Frequency           PT Plan Current plan remains appropriate    Co-evaluation              AM-PAC PT "6 Clicks" Mobility  Outcome Measure  Help needed turning from your back to your side while in a flat bed without using bedrails?: None Help needed moving from lying on your back to sitting on the side of a flat bed without using bedrails?: None Help needed moving to and from a bed to a chair (including a wheelchair)?: None Help needed standing up from a chair using your arms (e.g., wheelchair or bedside chair)?: None Help needed to walk in hospital room?: None Help needed climbing 3-5 steps with a railing? : A Little 6 Click Score: 23    End of Session Equipment Utilized During Treatment: Oxygen Activity Tolerance: Patient tolerated treatment well Patient left: in chair;with call bell/phone within reach Nurse Communication: Mobility status PT Visit Diagnosis: Difficulty in walking, not elsewhere classified (R26.2);Muscle weakness (generalized)  (M62.81)     Time: 6629-4765 PT Time Calculation (min) (ACUTE ONLY): 13 min  Charges:  $Self Care/Home Management: [place]ney, PT, DPT Acute Rehabilitation Services  Pager [phone] Office [place] 03/09/2020, 11:56 AM

## 2020-03-09 NOTE — TOC Transition Note (Signed)
Transition of Care [facility]) - CM/SW Discharge Note   Patient Details  Name: Lourdes Manning MRN: [id number] Date of Birth: [birth year]  Transition of Care [facility]) CM/SW Contact:  Verdell Carmine, RN Phone Number: 03/09/2020, 9:50 AM   Clinical Narrative:    Likely discharging today with home health from [employer] and [facility] via [facility]. No barriers identified.    Final next level of care: [place] Barriers to Discharge: No Barriers Identified   Patient Goals and CMS Choice Patient states their goals for this hospitalization and ongoing recovery are:: to go home CMS Medicare.gov Compare Post Acute Care list provided to:: Other (Comment Required) Choice offered to / list presented to : Spouse  Discharge Placement                       Discharge Plan and Services   Discharge Planning Services: CM Consult            DME Arranged: [facility] DME Agency: [facility] Date DME Agency Contacted: 03/08/20 Time DME Agency Contacted: [id number] Representative spoke with at DME Agency: [place]: PT, OT, RN [place] Agency: [place] ([place]) Date [place]: 03/07/20 Time [place]: [place] Representative spoke with at [place]: [place] ([place]) Interventions     Readmission Risk Interventions No flowsheet data found.

## 2020-03-09 NOTE — Discharge Summary (Signed)
Michael Wolf, is a 64 y.o. male  DOB 10/10/1955  MRN [id number].  Admission date:  03/03/2020  Admitting Physician  Cristal Ford, DO  Discharge Date:  03/09/2020   Primary MD  [name], MD  Recommendations for primary care physician for things to follow:  -Please check CBC, CMP during next visit. -A1c is 6.9, started on Metformin, adjust if needed -Adjust Antihypertensive regimen as needed.   Admission Diagnosis  Acute respiratory failure (HCC) [J96.00] Pneumonia [J18.9] Acute respiratory failure with hypoxia (HCC) [J96.01] Multifocal pneumonia [J18.9] Pneumonia due to COVID-19 virus [U07.1, J12.82] COVID-19 [U07.1]   Discharge Diagnosis  Acute respiratory failure ([place]) [J96.00] Pneumonia [J18.9] Acute respiratory failure with hypoxia (HCC) [J96.01] Multifocal pneumonia [J18.9] Pneumonia due to COVID-19 virus [U07.1, J12.82] COVID-19 [U07.1]   Principal Problem:   Pneumonia due to COVID-19 virus Active Problems:   Acute respiratory failure (HCC)   Non Hodgkin's lymphoma (HCC)   Transient hypotension   Hypokalemia   Hyponatremia   Obesity, Class III, BMI 40-49.9 (morbid obesity) ([place])      Past Medical History:  Diagnosis Date  . Cancer ([place])   . High cholesterol   . Hypertension   . Non Hodgkin's lymphoma ([place])   . Obesity   . Sleep apnea     Past Surgical History:  Procedure Laterality Date  . HIP SURGERY         History of present illness and  Hospital Course:     Kindly see H&P for history of present illness and admission details, please review complete Labs, Consult reports and Test reports for all details in brief  HPI  from the history and physical done on the day of admission 03/04/2020  HPI: Michael Wolf is a 64 y.o. male with medical history significant of hypertension, hyperlipidemia, non-Hodgkin's lymphoma in remission, and morbid obesity  presents with complaints of shortness of breath and cough.  He was initially diagnosed at [facility] about 2 weeks ago with COVID-19.  He had received the second of the two Pfizer vaccines in February.  He complains of having flulike symptoms reporting headache, fever, change in taste, no appetite, and myalgias.  He got so weak that he was unable to get up and move.  Not having any complaints of chest pain, vomiting, diarrhea  ED Course: Upon admission into the emergency department patient was seen to be afebrile, respiration 0-29, blood pressures 90/63-113/72, and O2 saturations documented as low as 85% on room air with improvement on 10 L of high flow nasal cannula oxygen.  Labs 8/20 significant for sodium 133, potassium 3.3, CRP 11, LDH 19, ferritin 1000, D-dimer 1.04,  fibrinogen 661, BNP 34.7, and procalcitonin <0.1.  Chest x-ray showed multifocal pneumonia versus edema.  Patient had been given remdesivir, 6 mg of Decadron, and started on baricitinib.   Hospital Course    1. Acute Hypoxic Resp. Failure due to Acute Covid 19 Viral Pneumonitis during the ongoing 2020 Covid 19 Pandemic - he has severe parenchymal lung injury and has been appropriately  placed on high-dose IV steroids, baricitinib and remdesivir, patient hypoxia is improving, as well his inflammatory markers improving as well, he is currently requiring 2 L nasal cannula at rest, and up to 4 L with activity, home oxygen has been arranged, he is to continue another 4 days of oral Decadron as an outpatient for total of 10 days treatment.   2.  Hypotension due to mild dehydration.    Has resolved, blood pressure has improved, he was instructed to resume his medications in 3 days   3. Dyslipidemia.  Continue home dose Crestor and Ezetimibe.  4.  Non-Hodgkin's lymphoma.  Supposedly in remission since 2018.  5.  Obesity.  BMI 40.  Follow with PCP for weight loss.  6. Hyperglycemia with new diagnosis of diabetes mellitus,controlled -A1c is  6.9, she is diagnostic of diabetes mellitus, he will be discharged on low-dose Metformin, this is still followed by PCP as an outpatient .    Discharge Condition:  stable   Follow UP   Follow-up Information    [facility]   Specialty: Home Health Services Why: For home health services       [facility] Follow up.   Why: for home oxygen Contact information: [place] [address] [phone]                 Discharge Instructions  and  Discharge Medications   Discharge Instructions    Discharge instructions   Complete by: As directed    Follow with Primary MD [name], MD in 10 days   Get CBC, CMP,  checked  by Primary MD next visit.    Activity: As tolerated with Full fall precautions use walker/cane & assistance as needed   Disposition Home    Diet: Heart Healthy     On your next visit with your primary care physician please Get Medicines reviewed and adjusted.   Please request your Prim.MD to go over all Hospital Tests and Procedure/Radiological results at the follow up, please get all Hospital records sent to your Prim MD by signing hospital release before you go home.   If you experience worsening of your admission symptoms, develop shortness of breath, life threatening emergency, suicidal or homicidal thoughts you must seek medical attention immediately by calling 911 or calling your MD immediately  if symptoms less severe.  You Must read complete instructions/literature along with all the possible adverse reactions/side effects for all the Medicines you take and that have been prescribed to you. Take any new Medicines after you have completely understood and accpet all the possible adverse reactions/side effects.   Do not drive, operating heavy machinery, perform activities at heights, swimming or participation in water activities or provide baby sitting services if your were admitted  for syncope or siezures until you have seen by Primary MD or a Neurologist and advised to do so again.  Do not drive when taking Pain medications.    Do not take more than prescribed Pain, Sleep and Anxiety Medications  Special Instructions: If you have smoked or chewed Tobacco  in the last 2 yrs please stop smoking, stop any regular Alcohol  and or any Recreational drug use.  Wear Seat belts while driving.   Please note  You were cared for by a hospitalist during your hospital stay. If you have any questions about your discharge medications or the care you received while you were in the hospital after you are discharged, you  can call the unit and asked to speak with the hospitalist on call if the hospitalist that took care of you is not available. Once you are discharged, your primary care physician will handle any further medical issues. Please note that NO REFILLS for any discharge medications will be authorized once you are discharged, as it is imperative that you return to your primary care physician (or establish a relationship with a primary care physician if you do not have one) for your aftercare needs so that they can reassess your need for medications and monitor your lab values.   Increase activity slowly   Complete by: As directed      Allergies as of 03/09/2020      Reactions   Sulfur    Thirsty, doesn't tolerate well per spouse      Medication List    TAKE these medications   cetirizine 10 MG tablet Commonly known as: ZYRTEC Take 10 mg by mouth daily.   clonazePAM 1 MG tablet Commonly known as: KLONOPIN Take 0.5-1 mg by mouth at bedtime.   desvenlafaxine 50 MG 24 hr tablet Commonly known as: PRISTIQ Take 50 mg by mouth daily.   dexamethasone 6 MG tablet Commonly known as: DECADRON Take 1 tablet (6 mg total) by mouth daily. Start taking on: March 10, 2020   ezetimibe 10 MG tablet Commonly known as: ZETIA Take 10 mg by mouth daily.   gabapentin 300 MG  capsule Commonly known as: NEURONTIN Take 300 mg by mouth every 12 (twelve) hours.   metFORMIN 500 MG tablet Commonly known as: Glucophage Take 1 tablet (500 mg total) by mouth 2 (two) times daily with a meal.   pantoprazole 40 MG tablet Commonly known as: Protonix Take 1 tablet (40 mg total) by mouth daily.   rosuvastatin 40 MG tablet Commonly known as: CRESTOR Take 40 mg by mouth daily.   triamterene-hydrochlorothiazide 37.5-25 MG tablet Commonly known as: MAXZIDE-25 Take 1 tablet by mouth daily. PLEASE START ON 8/29 Start taking on: March 12, 2020 What changed:   additional instructions  These instructions start on March 12, 2020. If you are unsure what to do until then, ask your doctor or other care provider.            Durable Medical Equipment  (From admission, onward)         Start     Ordered   03/08/20 1026  For home use only DME oxygen  Once       Question Answer Comment  Length of Need 6 Months   Mode or (Route) Nasal cannula   Liters per Minute 4   Frequency Continuous (stationary and portable oxygen unit needed)   Oxygen conserving device Yes   Oxygen delivery system Gas      03/08/20 1026            Diet and Activity recommendation: See Discharge Instructions above   Consults obtained -  none   Major procedures and Radiology Reports - PLEASE review detailed and final reports for all details, in brief -     DG Chest Port 1 View  Result Date: 03/05/2020 CLINICAL DATA:  COVID-19. EXAM: PORTABLE CHEST 1 VIEW COMPARISON:  March 03, 2020 FINDINGS: New hazy infiltrates are seen in the lungs, particularly the upper lobes. The cardiomediastinal silhouette is stable. No pneumothorax. No other acute abnormalities. IMPRESSION: New hazy infiltrates, particularly in the upper lobes, worrisome for developing COVID-19 pneumonia given history. Electronically Signed   By: Shanon Brow  Mee Hives M.D   On: 03/05/2020 08:38   DG Chest Port 1  View  Result Date: 03/03/2020 CLINICAL DATA:  COVID-19 diagnosed 2 weeks ago, increasing shortness of breath, hypoxemia EXAM: PORTABLE CHEST 1 VIEW COMPARISON:  None. FINDINGS: Single frontal view of the chest demonstrates an unremarkable cardiac silhouette. Diffuse interstitial prominence is seen, with faint ground-glass opacities within the mid and lower lung zones. No effusion or pneumothorax. No acute bony abnormality. IMPRESSION: 1. Interstitial prominence with bilateral ground-glass airspace disease. Differential includes multifocal atypical pneumonia versus edema. Electronically Signed   By: Randa Ngo M.D.   On: 03/03/2020 15:43    Micro Results    Recent Results (from the past 240 hour(s))  SARS Coronavirus 2 by RT PCR (hospital order, performed in [facility] hospital lab) Nasopharyngeal     Status: Abnormal   Collection Time: 03/03/20  3:26 PM   Specimen: Nasopharyngeal  Result Value Ref Range Status   SARS Coronavirus 2 POSITIVE (A) NEGATIVE Final    Comment: RESULT CALLED TO, READ BACK BY AND VERIFIED WITH: MARVA SIMMS RN @1656  03/03/2020 OLSONM (NOTE) SARS-CoV-2 target nucleic acids are DETECTED  SARS-CoV-2 RNA is generally detectable in upper respiratory specimens  during the acute phase of infection.  Positive results are indicative  of the presence of the identified virus, but do not rule out bacterial infection or co-infection with other pathogens not detected by the test.  Clinical correlation with patient history and  other diagnostic information is necessary to determine patient infection status.  The expected result is negative.  Fact Sheet for Patients:   StrictlyIdeas.no   Fact Sheet for Healthcare Providers:   BankingDealers.co.za    This test is not yet approved or cleared by the [place] FDA and  has been authorized for detection and/or diagnosis of SARS-CoV-2 by FDA under an Emergency Use  Authorization (EUA).  This EUA will remain in effect (meaning thi s test can be used) for the duration of  the COVID-19 declaration under Section 564(b)(1) of the Act, 21 U.S.C. section 360-bbb-3(b)(1), unless the authorization is terminated or revoked sooner.  Performed at [facility], [address] [place], [place] [address]   Blood Culture (routine x 2)     Status: None   Collection Time: 03/03/20  3:27 PM   Specimen: BLOOD LEFT ARM  Result Value Ref Range Status   Specimen Description   Final    BLOOD LEFT ARM Performed at [facility], [address] [place], [place] [address]    Special Requests   Final    BOTTLES DRAWN AEROBIC AND ANAEROBIC Blood Culture adequate volume Performed at [facility], [address] [place], [place] [address]    Culture   Final    NO GROWTH 5 DAYS Performed at [facility], [place] [address], [place], [place] [address]    Report Status 03/08/2020 FINAL  Final  Blood Culture (routine x 2)     Status: None   Collection Time: 03/03/20  3:40 PM   Specimen: BLOOD LEFT HAND  Result Value Ref Range Status   Specimen Description   Final    BLOOD LEFT HAND Performed at [facility], [address] [place], [place] [address]    Special Requests   Final    BOTTLES DRAWN AEROBIC AND ANAEROBIC Blood Culture adequate volume Performed at [facility], [address] [place], [place] [address]  Culture   Final    NO GROWTH 5 DAYS Performed at [facility], [place] [address], [place], [place] [address]    Report Status 03/08/2020 FINAL  Final       Today   Subjective:   Srijan Givan today has no headache,no chest or  abdominal pain,no new weakness tingling or numbness, feels much better wants to go home today.  Patient ambulated in the hallway couple times, he is requiring 4 L nasal cannula with activity, 2 L at rest.  Objective:   Blood pressure 132/71, pulse 86,  temperature 97.9 F (36.6 C), temperature source Oral, resp. rate (!) 22, height 6' (1.829 m), weight 136.1 kg, SpO2 (!) 88 %.   Intake/Output Summary (Last 24 hours) at 03/09/2020 1118 Last data filed at 03/09/2020 0541 Gross per 24 hour  Intake 360 ml  Output 1325 ml  Net -965 ml    Exam Awake Alert, Oriented x 3, No new F.N deficits, Normal affect  Symmetrical Chest wall movement, Good air movement bilaterally, CTAB RRR,No Gallops,Rubs or new Murmurs, No Parasternal Heave +ve B.Sounds, Abd Soft, Non tender,No rebound -guarding or rigidity. No Cyanosis, Clubbing or edema, No new Rash or bruise  Data Review   CBC w Diff:  Lab Results  Component Value Date   WBC 9.6 03/08/2020   HGB 13.7 03/08/2020   HCT 42.4 03/08/2020   PLT 375 03/08/2020   LYMPHOPCT 1 03/08/2020   MONOPCT 4 03/08/2020   EOSPCT 0 03/08/2020   BASOPCT 0 03/08/2020    CMP:  Lab Results  Component Value Date   NA 139 03/08/2020   K 4.1 03/08/2020   CL 100 03/08/2020   CO2 25 03/08/2020   BUN 21 03/08/2020   CREATININE 0.98 03/08/2020   PROT 5.7 (L) 03/08/2020   ALBUMIN 2.8 (L) 03/08/2020   BILITOT 1.0 03/08/2020   ALKPHOS 62 03/08/2020   AST 43 (H) 03/08/2020   ALT 70 (H) 03/08/2020  .   Total Time in preparing paper work, data evaluation and todays exam - 44 minutes  Michael Wolf M.D on 03/09/2020 at 11:18 AM  [facility]   Office  [phone]

## 2020-03-10 ENCOUNTER — Encounter (HOSPITAL_COMMUNITY): Payer: Self-pay

## 2020-03-10 ENCOUNTER — Emergency Department (HOSPITAL_COMMUNITY): Payer: Medicare Other

## 2020-03-10 ENCOUNTER — Inpatient Hospital Stay (HOSPITAL_COMMUNITY)
Admission: EM | Admit: 2020-03-10 | Discharge: 2020-04-14 | DRG: 177 | Disposition: E | Payer: Medicare Other | Attending: Internal Medicine | Admitting: Internal Medicine

## 2020-03-10 DIAGNOSIS — Z515 Encounter for palliative care: Secondary | ICD-10-CM | POA: Diagnosis not present

## 2020-03-10 DIAGNOSIS — Z7984 Long term (current) use of oral hypoglycemic drugs: Secondary | ICD-10-CM | POA: Diagnosis not present

## 2020-03-10 DIAGNOSIS — E119 Type 2 diabetes mellitus without complications: Secondary | ICD-10-CM | POA: Diagnosis present

## 2020-03-10 DIAGNOSIS — Z8572 Personal history of non-Hodgkin lymphomas: Secondary | ICD-10-CM | POA: Diagnosis not present

## 2020-03-10 DIAGNOSIS — T380X5A Adverse effect of glucocorticoids and synthetic analogues, initial encounter: Secondary | ICD-10-CM | POA: Diagnosis not present

## 2020-03-10 DIAGNOSIS — J9601 Acute respiratory failure with hypoxia: Secondary | ICD-10-CM | POA: Diagnosis present

## 2020-03-10 DIAGNOSIS — Z66 Do not resuscitate: Secondary | ICD-10-CM | POA: Diagnosis not present

## 2020-03-10 DIAGNOSIS — F419 Anxiety disorder, unspecified: Secondary | ICD-10-CM | POA: Diagnosis present

## 2020-03-10 DIAGNOSIS — R600 Localized edema: Secondary | ICD-10-CM | POA: Diagnosis present

## 2020-03-10 DIAGNOSIS — G4733 Obstructive sleep apnea (adult) (pediatric): Secondary | ICD-10-CM | POA: Diagnosis present

## 2020-03-10 DIAGNOSIS — R0902 Hypoxemia: Secondary | ICD-10-CM | POA: Diagnosis present

## 2020-03-10 DIAGNOSIS — J1282 Pneumonia due to coronavirus disease 2019: Secondary | ICD-10-CM | POA: Diagnosis present

## 2020-03-10 DIAGNOSIS — Z6841 Body Mass Index (BMI) 40.0 and over, adult: Secondary | ICD-10-CM

## 2020-03-10 DIAGNOSIS — I4891 Unspecified atrial fibrillation: Secondary | ICD-10-CM | POA: Diagnosis present

## 2020-03-10 DIAGNOSIS — C8590 Non-Hodgkin lymphoma, unspecified, unspecified site: Secondary | ICD-10-CM | POA: Diagnosis present

## 2020-03-10 DIAGNOSIS — R0602 Shortness of breath: Secondary | ICD-10-CM

## 2020-03-10 DIAGNOSIS — Z9689 Presence of other specified functional implants: Secondary | ICD-10-CM

## 2020-03-10 DIAGNOSIS — Z79899 Other long term (current) drug therapy: Secondary | ICD-10-CM

## 2020-03-10 DIAGNOSIS — E785 Hyperlipidemia, unspecified: Secondary | ICD-10-CM | POA: Diagnosis present

## 2020-03-10 DIAGNOSIS — U071 COVID-19: Secondary | ICD-10-CM

## 2020-03-10 DIAGNOSIS — J860 Pyothorax with fistula: Secondary | ICD-10-CM | POA: Diagnosis not present

## 2020-03-10 DIAGNOSIS — J9383 Other pneumothorax: Secondary | ICD-10-CM | POA: Diagnosis not present

## 2020-03-10 DIAGNOSIS — J9311 Primary spontaneous pneumothorax: Secondary | ICD-10-CM | POA: Diagnosis not present

## 2020-03-10 DIAGNOSIS — I959 Hypotension, unspecified: Secondary | ICD-10-CM | POA: Diagnosis present

## 2020-03-10 DIAGNOSIS — J939 Pneumothorax, unspecified: Secondary | ICD-10-CM | POA: Diagnosis not present

## 2020-03-10 DIAGNOSIS — D72829 Elevated white blood cell count, unspecified: Secondary | ICD-10-CM | POA: Diagnosis not present

## 2020-03-10 DIAGNOSIS — E66813 Obesity, class 3: Secondary | ICD-10-CM | POA: Diagnosis present

## 2020-03-10 DIAGNOSIS — I10 Essential (primary) hypertension: Secondary | ICD-10-CM | POA: Diagnosis present

## 2020-03-10 DIAGNOSIS — J9312 Secondary spontaneous pneumothorax: Secondary | ICD-10-CM | POA: Diagnosis not present

## 2020-03-10 LAB — CBC WITH DIFFERENTIAL/PLATELET
Abs Immature Granulocytes: 0.23 10*3/uL — ABNORMAL HIGH (ref 0.00–0.07)
Basophils Absolute: 0 10*3/uL (ref 0.0–0.1)
Basophils Relative: 0 %
Eosinophils Absolute: 0 10*3/uL (ref 0.0–0.5)
Eosinophils Relative: 0 %
HCT: 42.6 % (ref 39.0–52.0)
Hemoglobin: 13.6 g/dL (ref 13.0–17.0)
Immature Granulocytes: 2 %
Lymphocytes Relative: 1 %
Lymphs Abs: 0.1 10*3/uL — ABNORMAL LOW (ref 0.7–4.0)
MCH: 28.9 pg (ref 26.0–34.0)
MCHC: 31.9 g/dL (ref 30.0–36.0)
MCV: 90.6 fL (ref 80.0–100.0)
Monocytes Absolute: 0.7 10*3/uL (ref 0.1–1.0)
Monocytes Relative: 7 %
Neutro Abs: 8.5 10*3/uL — ABNORMAL HIGH (ref 1.7–7.7)
Neutrophils Relative %: 90 %
Platelets: 360 10*3/uL (ref 150–400)
RBC: 4.7 MIL/uL (ref 4.22–5.81)
RDW: 14.5 % (ref 11.5–15.5)
WBC: 9.5 10*3/uL (ref 4.0–10.5)
nRBC: 0.2 % (ref 0.0–0.2)

## 2020-03-10 LAB — COMPREHENSIVE METABOLIC PANEL
ALT: 49 U/L — ABNORMAL HIGH (ref 0–44)
AST: 39 U/L (ref 15–41)
Albumin: 2.6 g/dL — ABNORMAL LOW (ref 3.5–5.0)
Alkaline Phosphatase: 61 U/L (ref 38–126)
Anion gap: 11 (ref 5–15)
BUN: 21 mg/dL (ref 8–23)
CO2: 23 mmol/L (ref 22–32)
Calcium: 8 mg/dL — ABNORMAL LOW (ref 8.9–10.3)
Chloride: 102 mmol/L (ref 98–111)
Creatinine, Ser: 1.25 mg/dL — ABNORMAL HIGH (ref 0.61–1.24)
GFR calc Af Amer: >60 mL/min (ref >60)
GFR calc non Af Amer: >60 mL/min (ref >60)
Glucose, Bld: 132 mg/dL — ABNORMAL HIGH (ref 70–99)
Potassium: 4.1 mmol/L (ref 3.5–5.1)
Sodium: 136 mmol/L (ref 135–145)
Total Bilirubin: 0.6 mg/dL (ref 0.3–1.2)
Total Protein: 5.3 g/dL — ABNORMAL LOW (ref 6.5–8.1)

## 2020-03-10 LAB — D-DIMER, QUANTITATIVE: D-Dimer, Quant: 0.74 ug/mL-FEU — ABNORMAL HIGH (ref 0.00–0.50)

## 2020-03-10 LAB — LACTATE DEHYDROGENASE: LDH: 446 U/L — ABNORMAL HIGH (ref 98–192)

## 2020-03-10 LAB — LACTIC ACID, PLASMA
Lactic Acid, Venous: 2.6 mmol/L (ref 0.5–1.9)
Lactic Acid, Venous: 1.3 mmol/L (ref 0.5–1.9)

## 2020-03-10 LAB — PROCALCITONIN: Procalcitonin: <0.1 ng/mL

## 2020-03-10 LAB — C-REACTIVE PROTEIN: CRP: 4.8 mg/dL — ABNORMAL HIGH (ref <1.0)

## 2020-03-10 LAB — FERRITIN: Ferritin: 751 ng/mL — ABNORMAL HIGH (ref 24–336)

## 2020-03-10 LAB — GLUCOSE, CAPILLARY: Glucose-Capillary: 133 mg/dL — ABNORMAL HIGH (ref 70–99)

## 2020-03-10 LAB — FIBRINOGEN: Fibrinogen: 627 mg/dL — ABNORMAL HIGH (ref 210–475)

## 2020-03-10 LAB — TRIGLYCERIDES: Triglycerides: 122 mg/dL (ref <150)

## 2020-03-10 MED ORDER — PANTOPRAZOLE SODIUM 40 MG PO TBEC
40.0000 mg | DELAYED_RELEASE_TABLET | Freq: Every day | ORAL | Status: DC
  Administered 2020-03-10 – 2020-03-24 (×15): 40 mg via ORAL
  Filled 2020-03-10 (×15): qty 1

## 2020-03-10 MED ORDER — DEXAMETHASONE 6 MG PO TABS
6.0000 mg | ORAL_TABLET | Freq: Every day | ORAL | Status: DC
  Administered 2020-03-10: 6 mg via ORAL
  Filled 2020-03-10: qty 2

## 2020-03-10 MED ORDER — EZETIMIBE 10 MG PO TABS
10.0000 mg | ORAL_TABLET | Freq: Every day | ORAL | Status: DC
  Administered 2020-03-10 – 2020-03-24 (×15): 10 mg via ORAL
  Filled 2020-03-10 (×15): qty 1

## 2020-03-10 MED ORDER — ROSUVASTATIN CALCIUM 20 MG PO TABS
40.0000 mg | ORAL_TABLET | Freq: Every day | ORAL | Status: DC
  Administered 2020-03-10 – 2020-03-24 (×15): 40 mg via ORAL
  Filled 2020-03-10: qty 8
  Filled 2020-03-10 (×14): qty 2

## 2020-03-10 MED ORDER — GABAPENTIN 300 MG PO CAPS
300.0000 mg | ORAL_CAPSULE | Freq: Two times a day (BID) | ORAL | Status: DC
  Administered 2020-03-10 – 2020-03-24 (×28): 300 mg via ORAL
  Filled 2020-03-10 (×28): qty 1

## 2020-03-10 MED ORDER — ENOXAPARIN SODIUM 80 MG/0.8ML ~~LOC~~ SOLN
70.0000 mg | SUBCUTANEOUS | Status: DC
  Administered 2020-03-10 – 2020-03-16 (×7): 70 mg via SUBCUTANEOUS
  Filled 2020-03-10 (×7): qty 0.8
  Filled 2020-03-10: qty 0.7

## 2020-03-10 MED ORDER — CLONAZEPAM 0.5 MG PO TABS
0.5000 mg | ORAL_TABLET | Freq: Two times a day (BID) | ORAL | Status: DC | PRN
  Administered 2020-03-12 – 2020-03-22 (×7): 0.5 mg via ORAL
  Filled 2020-03-10 (×7): qty 1

## 2020-03-10 MED ORDER — LORATADINE 10 MG PO TABS
10.0000 mg | ORAL_TABLET | Freq: Every day | ORAL | Status: DC
  Administered 2020-03-10 – 2020-03-24 (×15): 10 mg via ORAL
  Filled 2020-03-10 (×15): qty 1

## 2020-03-10 MED ORDER — ALBUMIN HUMAN 25 % IV SOLN
25.0000 g | Freq: Four times a day (QID) | INTRAVENOUS | Status: AC
  Administered 2020-03-10 – 2020-03-11 (×4): 25 g via INTRAVENOUS
  Filled 2020-03-10 (×6): qty 100

## 2020-03-10 MED ORDER — INSULIN ASPART 100 UNIT/ML ~~LOC~~ SOLN
0.0000 [IU] | Freq: Three times a day (TID) | SUBCUTANEOUS | Status: DC
  Administered 2020-03-11: 5 [IU] via SUBCUTANEOUS
  Administered 2020-03-12 (×2): 3 [IU] via SUBCUTANEOUS
  Administered 2020-03-12 – 2020-03-14 (×4): 2 [IU] via SUBCUTANEOUS
  Administered 2020-03-14 – 2020-03-15 (×3): 3 [IU] via SUBCUTANEOUS
  Administered 2020-03-16: 2 [IU] via SUBCUTANEOUS
  Administered 2020-03-16: 3 [IU] via SUBCUTANEOUS
  Administered 2020-03-16: 2 [IU] via SUBCUTANEOUS
  Administered 2020-03-17: 5 [IU] via SUBCUTANEOUS
  Administered 2020-03-18 – 2020-03-19 (×4): 2 [IU] via SUBCUTANEOUS
  Administered 2020-03-19: 3 [IU] via SUBCUTANEOUS
  Administered 2020-03-19 – 2020-03-22 (×6): 2 [IU] via SUBCUTANEOUS
  Administered 2020-03-22: 3 [IU] via SUBCUTANEOUS
  Administered 2020-03-22: 2 [IU] via SUBCUTANEOUS
  Administered 2020-03-23 (×3): 3 [IU] via SUBCUTANEOUS

## 2020-03-10 NOTE — H&P (Signed)
History and Physical    Michael Wolf IFO:[id number] DOB: 1956-06-10 DOA: 03/03/2020  PCP: [name], MD (Confirm with patient/family/NH records and if not entered, this has to be entered at [facility] point of entry) Patient coming from: Home  I have personally briefly reviewed patient's old medical records in [place]  Chief Complaint: SOB  HPI: Michael Wolf is a 64 y.o. male with medical history significant of HTN, HLD, IIDM, non-Hodgkin's lymphoma in remission, OSA on HS CPAP, morbid obesity and recent breakthrough COVID-19 PNA s/p baricitinib and remdesivir and was discharged home yesterday on 2-4 L of home O2. Pt reported that the O2 concentrator has the "air flow coming out" via nasal cannula, but unable to hook it to CPAP overnight, and this morning, pt felt more SOB and check his PulseOx at home showing 86%, he denied any cough, no wheezing, no chest pain, no fever or chills. ED Course: Hypoxia O2 sat in 80s on arrival, then improved to 93-95% on 15 L. Chest Xray no significant changes compared to earlier this week.  WBC 9.5, Cre 1.24 compared to 0.98 2 days ago. procalcitonin <0.1, D-Dimer 0.74.  Review of Systems: As per HPI otherwise 10 point review of systems negative.    Past Medical History:  Diagnosis Date  . Cancer ([place])   . High cholesterol   . Hypertension   . Non Hodgkin's lymphoma ([place])   . Obesity   . Sleep apnea     Past Surgical History:  Procedure Laterality Date  . HIP SURGERY       reports that he has never smoked. He has never used smokeless tobacco. He reports that he does not drink alcohol and does not use drugs.  Allergies  Allergen Reactions  . Sulfur     Thirsty, doesn't tolerate well per spouse    History reviewed. No pertinent family history.  Prior to Admission medications   Medication Sig Start Date End Date Taking? Authorizing Provider  cetirizine (ZYRTEC) 10 MG tablet Take 10 mg by mouth daily.    [provider]    clonazePAM (KLONOPIN) 1 MG tablet Take 0.5-1 mg by mouth at bedtime. 01/08/20   [provider]  desvenlafaxine (PRISTIQ) 50 MG 24 hr tablet Take 50 mg by mouth daily.    [provider]  dexamethasone (DECADRON) 6 MG tablet Take 1 tablet (6 mg total) by mouth daily. 03/07/2020   Elgergawy, Silver Huguenin, MD  ezetimibe (ZETIA) 10 MG tablet Take 10 mg by mouth daily.    [provider]  gabapentin (NEURONTIN) 300 MG capsule Take 300 mg by mouth every 12 (twelve) hours. 12/17/19   [provider]  metFORMIN (GLUCOPHAGE) 500 MG tablet Take 1 tablet (500 mg total) by mouth 2 (two) times daily with a meal. 03/09/20 03/09/21  Elgergawy, Silver Huguenin, MD  pantoprazole (PROTONIX) 40 MG tablet Take 1 tablet (40 mg total) by mouth daily. 03/09/20 04/08/20  Elgergawy, Silver Huguenin, MD  rosuvastatin (CRESTOR) 40 MG tablet Take 40 mg by mouth daily.    [provider]  triamterene-hydrochlorothiazide (MAXZIDE-25) 37.5-25 MG tablet Take 1 tablet by mouth daily. PLEASE START ON 8/29 03/12/20   Elgergawy, Silver Huguenin, MD    Physical Exam: Vitals:   03/13/2020 1630 02/26/2020 1700 03/03/2020 1715 03/01/2020 1745  BP: 104/67 102/66 104/60 108/74  Pulse: 96 94 90 100  Resp: 20 (!) 21 (!) 21 (!) 25  Temp:      TempSrc:      SpO2:  97% 96% 96% 93%  Weight:      Height:        Constitutional: NAD, calm, comfortable Vitals:   02/20/2020 1630 02/22/2020 1700 02/28/2020 1715 02/23/2020 1745  BP: 104/67 102/66 104/60 108/74  Pulse: 96 94 90 100  Resp: 20 (!) 21 (!) 21 (!) 25  Temp:      TempSrc:      SpO2: 97% 96% 96% 93%  Weight:      Height:       Eyes: PERRL, lids and conjunctivae normal ENMT: Mucous membranes are moist. Posterior pharynx clear of any exudate or lesions.Normal dentition.  Neck: normal, supple, no masses, no thyromegaly Respiratory: clear to auscultation bilaterally, no wheezing, fine crackles on B/L bases. Increased respiratory effort. No accessory muscle use.  Cardiovascular:  Regular rate and rhythm, no murmurs / rubs / gallops. No extremity edema. 2+ pedal pulses. No carotid bruits.  Abdomen: no tenderness, no masses palpated. No hepatosplenomegaly. Bowel sounds positive.  Musculoskeletal: no clubbing / cyanosis. No joint deformity upper and lower extremities. Good ROM, no contractures. Normal muscle tone.  Skin: no rashes, lesions, ulcers. No induration Neurologic: CN 2-12 grossly intact. Sensation intact, DTR normal. Strength 5/5 in all 4.  Psychiatric: Normal judgment and insight. Alert and oriented x 3. Normal mood.     Labs on Admission: I have personally reviewed following labs and imaging studies  CBC: Recent Labs  Lab 03/05/20 0249 03/06/20 0426 03/07/20 1014 03/08/20 0934 03/07/2020 1613  WBC 6.9 8.3 10.1 9.6 9.5  NEUTROABS 6.4 7.7 9.3* 9.1* 8.5*  HGB 13.0 12.9* 13.1 13.7 13.6  HCT 40.4 40.3 41.3 42.4 42.6  MCV 89.0 91.8 91.6 90.8 90.6  PLT 316 334 356 375 161   Basic Metabolic Panel: Recent Labs  Lab 03/05/20 0249 03/06/20 0426 03/07/20 1014 03/08/20 0934 02/27/2020 1613  NA 138 141 141 139 136  K 4.3 4.6 4.4 4.1 4.1  CL 102 101 101 100 102  CO2 27 28 29 25 23   GLUCOSE 190* 175* 212* 252* 132*  BUN 22 23 21 21 21   CREATININE 0.97 1.09 0.95 0.98 1.25*  CALCIUM 8.4* 8.5* 8.2* 8.1* 8.0*  MG 2.4 2.3 2.3 2.2  --   PHOS 3.4  --   --   --   --    GFR: Estimated Creatinine Clearance: 87 mL/min (A) (by C-G formula based on SCr of 1.25 mg/dL (H)). Liver Function Tests: Recent Labs  Lab 03/05/20 0249 03/06/20 0426 03/07/20 1014 03/08/20 0934 03/08/2020 1613  AST 95* 67* 51* 43* 39  ALT 85* 77* 74* 70* 49*  ALKPHOS 59 54 54 62 61  BILITOT 0.3 0.6 0.6 1.0 0.6  PROT 5.6* 5.4* 5.6* 5.7* 5.3*  ALBUMIN 2.5* 2.4* 2.7* 2.8* 2.6*   No results for input(s): LIPASE, AMYLASE in the last 168 hours. No results for input(s): AMMONIA in the last 168 hours. Coagulation Profile: No results for input(s): INR, PROTIME in the last 168 hours. Cardiac  Enzymes: No results for input(s): CKTOTAL, CKMB, CKMBINDEX, TROPONINI in the last 168 hours. BNP (last 3 results) No results for input(s): PROBNP in the last 8760 hours. HbA1C: Recent Labs    03/08/20 0500  HGBA1C 6.9*   CBG: Recent Labs  Lab 03/08/20 1737 03/08/20 2035 03/09/20 0816 03/09/20 1144  GLUCAP 121* 146* 102* 114*   Lipid Profile: Recent Labs    02/19/2020 1613  TRIG 122   Thyroid Function Tests: No results for input(s): TSH, T4TOTAL, FREET4, T3FREE, THYROIDAB  in the last 72 hours. Anemia Panel: Recent Labs    03/12/2020 1613  FERRITIN 751*   Urine analysis: No results found for: COLORURINE, APPEARANCEUR, LABSPEC, PHURINE, GLUCOSEU, HGBUR, BILIRUBINUR, KETONESUR, PROTEINUR, UROBILINOGEN, NITRITE, LEUKOCYTESUR  Radiological Exams on Admission: DG Chest Port 1 View  Result Date: 02/26/2020 CLINICAL DATA:  Short of breath EXAM: PORTABLE CHEST 1 VIEW COMPARISON:  03/05/2020 FINDINGS: Progression of right lower lobe airspace disease. Cardiac silhouette upper normal. Negative for heart failure. Left lung clear. Probable underlying COPD IMPRESSION: COPD.  Progressive right lower lobe infiltrate. Electronically Signed   By: Franchot Gallo M.D.   On: 02/20/2020 16:17    EKG: Independently reviewed. RBBB chronic  Assessment/Plan Active Problems:   Hypoxia  (please populate well all problems here in Problem List. (For example, if patient is on BP meds at home and you resume or decide to hold them, it is a problem that needs to be her. Same for CAD, COPD, HLD and so on)  Acute hypoxia respiratory failure post COVID PNA -Abrupt worsening hypoxia compared to yesterday, no significant hematologic changes, most recent baseline oxygen level 10/18/2019 office visit showed O2 saturation 95% on room air.  Not sure but that is related to unable to combine CPAP with oxygen concentrator overnight which might potentially worsened his oxygenation. -Procalcitonin level low, x-ray not  showing any significant infiltrates, not considering starting antibiotics.  We will send a CT without contrast. -Elevated D-dimer level compared to yesterday, unable to perform CT angiogram given worsening kidney functions.  Trend D-dimers, consider CTA if going up. -Echo to rule out Pulm HTN  Plan hypertensionBorderline Hypotension -Albumin for hydration -Hold BP meds for now  HLD -On Statin and Ezetimibe  Non-Hodgkin's Lymphoma -Remission since 2018  OSA -As above, may need some adjustment at home so that he can use both CPAP and O2 at night.  Morbid Obesity -Outpt PCP F/U  IIDM -Change to insulin for now as pt still on steroid   DVT prophylaxis: Lovenox Code Status: Full Family Communication: None at bedside Disposition Plan: Expect 1-2 days hospital stay to wean down O2 Consults called: None Admission status: Tele admit   Lequita Halt MD [facility] Pager [phone]  02/18/2020, 6:43 PM

## 2020-03-10 NOTE — ED Triage Notes (Signed)
Assume care from EMS per Ems pt was seen here last week and admitted into the hospital and was discharge yesterday.EMS reports pt was diagnose with covid pneumonia and was discharge home on 4L [place]. EMS states at home pt o2 sats decrease to 84-85%  Ambulating with worsening sob. Pt is place on the monitor x 3, call bell within reach

## 2020-03-10 NOTE — ED Notes (Signed)
Please call Anas Reister (wife) [phone] with an update on pt

## 2020-03-10 NOTE — ED Notes (Addendum)
Pt did not tolerate 4L [place] pt O2 sats decrease to 84%, This rn place pt on non-rebreather mask where pt O2 sats improve to 95%  1600 MD at bedside evaluating pt   1607 This  RN called RT to place pt on Hi [place] O2

## 2020-03-10 NOTE — ED Notes (Addendum)
Date and time results received: 03/01/2020 1657   Test: Lactic acid  Critical Value: 2.6  Name of Provider Notified: Calvert Cantor    Orders Received? Or Actions Taken: Orders Received - See Orders for details

## 2020-03-10 NOTE — ED Provider Notes (Signed)
[facility] EMERGENCY DEPARTMENT Provider Note  CSN: [id number] Arrival date & time: 03/12/2020 1517    History Chief Complaint  Patient presents with  . Shortness of Breath    HPI  Michael Wolf is a 64 y.o. male with history of non-hodkin's lymphoma in remission who was recently admitted for Covid-19 pneumonia after vaccination earlier this year. He was given Remdesivir and MAB as well as Decadron and discharged yesterday on supplemental O2. He was doing well at discharge but since then has had worsening SOB including hypoxia despite home O2 at 4L [place]. He denies any continued fevers. Occasional cough, worse with using inhaler.    Past Medical History:  Diagnosis Date  . Cancer ([place])   . High cholesterol   . Hypertension   . Non Hodgkin's lymphoma ([place])   . Obesity   . Sleep apnea     Past Surgical History:  Procedure Laterality Date  . HIP SURGERY      History reviewed. No pertinent family history.  Social History   Tobacco Use  . Smoking status: Never Smoker  . Smokeless tobacco: Never Used  Substance Use Topics  . Alcohol use: No  . Drug use: No     Home Medications Prior to Admission medications   Medication Sig Start Date End Date Taking? Authorizing Provider  cetirizine (ZYRTEC) 10 MG tablet Take 10 mg by mouth daily.    [provider]  clonazePAM (KLONOPIN) 1 MG tablet Take 0.5-1 mg by mouth at bedtime. 01/08/20   [provider]  desvenlafaxine (PRISTIQ) 50 MG 24 hr tablet Take 50 mg by mouth daily.    [provider]  dexamethasone (DECADRON) 6 MG tablet Take 1 tablet (6 mg total) by mouth daily. 02/19/2020   Elgergawy, Silver Huguenin, MD  ezetimibe (ZETIA) 10 MG tablet Take 10 mg by mouth daily.    [provider]  gabapentin (NEURONTIN) 300 MG capsule Take 300 mg by mouth every 12 (twelve) hours. 12/17/19   [provider]  metFORMIN (GLUCOPHAGE) 500 MG tablet Take 1 tablet (500 mg total) by mouth 2 (two) times  daily with a meal. 03/09/20 03/09/21  Elgergawy, Silver Huguenin, MD  pantoprazole (PROTONIX) 40 MG tablet Take 1 tablet (40 mg total) by mouth daily. 03/09/20 04/08/20  Elgergawy, Silver Huguenin, MD  rosuvastatin (CRESTOR) 40 MG tablet Take 40 mg by mouth daily.    [provider]  triamterene-hydrochlorothiazide (MAXZIDE-25) 37.5-25 MG tablet Take 1 tablet by mouth daily. PLEASE START ON 8/29 03/12/20   Elgergawy, Silver Huguenin, MD     Allergies    Sulfur   Review of Systems   Review of Systems A comprehensive review of systems was completed and negative except as noted in HPI.    Physical Exam BP 108/74   Pulse 100   Temp 98.1 F (36.7 C) (Oral)   Resp (!) 25   Ht 6' (1.829 m)   Wt (!) 144.5 kg   SpO2 93%   BMI 43.21 kg/m   Physical Exam Vitals and nursing note reviewed.  Constitutional:      Appearance: Normal appearance. He is obese.  HENT:     Head: Normocephalic and atraumatic.     Nose: Nose normal.     Mouth/Throat:     Mouth: Mucous membranes are moist.  Eyes:     Extraocular Movements: Extraocular movements intact.     Conjunctiva/sclera: Conjunctivae normal.  Cardiovascular:     Rate and Rhythm: Normal rate.  Pulmonary:  Effort: Pulmonary effort is normal.     Breath sounds: Normal breath sounds.  Abdominal:     General: Abdomen is flat.     Palpations: Abdomen is soft.     Tenderness: There is no abdominal tenderness.  Musculoskeletal:        General: No swelling. Normal range of motion.     Cervical back: Neck supple.  Skin:    General: Skin is warm and dry.  Neurological:     General: No focal deficit present.     Mental Status: He is alert.  Psychiatric:        Mood and Affect: Mood normal.      ED Results / Procedures / Treatments   Labs (all labs ordered are listed, but only abnormal results are displayed) Labs Reviewed  LACTIC ACID, PLASMA - Abnormal; Notable for the following components:      Result Value   Lactic Acid, Venous 2.6 (*)     All other components within normal limits  CBC WITH DIFFERENTIAL/PLATELET - Abnormal; Notable for the following components:   Neutro Abs 8.5 (*)    Lymphs Abs 0.1 (*)    Abs Immature Granulocytes 0.23 (*)    All other components within normal limits  COMPREHENSIVE METABOLIC PANEL - Abnormal; Notable for the following components:   Glucose, Bld 132 (*)    Creatinine, Ser 1.25 (*)    Calcium 8.0 (*)    Total Protein 5.3 (*)    Albumin 2.6 (*)    ALT 49 (*)    All other components within normal limits  D-DIMER, QUANTITATIVE (NOT AT [facility]) - Abnormal; Notable for the following components:   D-Dimer, Quant 0.74 (*)    All other components within normal limits  LACTATE DEHYDROGENASE - Abnormal; Notable for the following components:   LDH 446 (*)    All other components within normal limits  FERRITIN - Abnormal; Notable for the following components:   Ferritin 751 (*)    All other components within normal limits  FIBRINOGEN - Abnormal; Notable for the following components:   Fibrinogen 627 (*)    All other components within normal limits  C-REACTIVE PROTEIN - Abnormal; Notable for the following components:   CRP 4.8 (*)    All other components within normal limits  CULTURE, BLOOD (ROUTINE X 2)  CULTURE, BLOOD (ROUTINE X 2)  PROCALCITONIN  TRIGLYCERIDES  LACTIC ACID, PLASMA    EKG EKG Interpretation  Date/Time:  Friday March 10 2020 16:10:49 EDT Ventricular Rate:  103 PR Interval:    QRS Duration: 100 QT Interval:  341 QTC Calculation: 447 R Axis:   80 Text Interpretation: Sinus tachycardia Low voltage, precordial leads Nonspecific T abnormalities, anterior leads No significant change since last tracing Confirmed by Calvert Cantor [phone]) on 03/04/2020 4:21:30 PM   Radiology DG Chest Port 1 View  Result Date: 02/23/2020 CLINICAL DATA:  Short of breath EXAM: PORTABLE CHEST 1 VIEW COMPARISON:  03/05/2020 FINDINGS: Progression of right lower lobe airspace disease. Cardiac  silhouette upper normal. Negative for heart failure. Left lung clear. Probable underlying COPD IMPRESSION: COPD.  Progressive right lower lobe infiltrate. Electronically Signed   By: Franchot Gallo M.D.   On: 02/19/2020 16:17    Procedures .Critical Care Performed by: Truddie Hidden, MD Authorized by: Truddie Hidden, MD   Critical care provider statement:    Critical care time (minutes):  45   Critical care was necessary to treat or prevent imminent or life-threatening deterioration of  the following conditions:  Respiratory failure   Critical care was time spent personally by me on the following activities:  Discussions with consultants, evaluation of patient's response to treatment, examination of patient, ordering and performing treatments and interventions, ordering and review of laboratory studies, ordering and review of radiographic studies, pulse oximetry, re-evaluation of patient's condition, obtaining history from patient or surrogate and review of old charts    Medications Ordered in the ED Medications - No data to display   MDM Rules/Calculators/A&P MDM Patient with known Covid pneumonia has had worsening SOB since discharge yesterday despite home O2 at 4L [place]. He was noted to by hypoxic into the 80s on arrival, placed on NRB. His SpO2 was 88-90% on NRB at 15L on my initial evaluation. Oxygen was turned up to Max. RN asked to contact RT about trying additional oxygen modalities if necessary. Covid labs ordered to compare to recent admission.  ED Course  I have reviewed the triage vital signs and the nursing notes.  Pertinent labs & imaging results that were available during my care of the patient were reviewed by me and considered in my medical decision making (see chart for details).  Clinical Course as of Mar 10 1800  Fri Mar 10, 2020  1708 CBC without leukocytosis, CMP with mildly elevated Cr above baseline.    [CS]  1737 Hospitalist paged for re-admission for  worsening hypoxic respiratory failure due to Covid. Tyliek Timberman was evaluated in Emergency Department on 02/14/2020 for the symptoms described in the history of present illness. He was evaluated in the context of the global COVID-19 pandemic, which necessitated consideration that the patient might be at risk for infection with the SARS-CoV-2 virus that causes COVID-19. Institutional protocols and algorithms that pertain to the evaluation of patients at risk for COVID-19 are in a state of rapid change based on information released by regulatory bodies including the CDC and federal and state organizations. These policies and algorithms were followed during the patient's care in the ED.     [CS]  1800 Spoke with Dr. Roosevelt Locks, Hospitalist, who will evaluate the patient.    [CS]    Clinical Course User Index [CS] Truddie Hidden, MD    Final Clinical Impression(s) / ED Diagnoses Final diagnoses:  COVID-19  Acute respiratory failure with hypoxia [facility])    Rx / DC Orders ED Discharge Orders    None       Truddie Hidden, MD 02/18/2020 1801

## 2020-03-11 ENCOUNTER — Other Ambulatory Visit: Payer: Self-pay

## 2020-03-11 ENCOUNTER — Inpatient Hospital Stay (HOSPITAL_COMMUNITY): Payer: Medicare Other

## 2020-03-11 DIAGNOSIS — C8590 Non-Hodgkin lymphoma, unspecified, unspecified site: Secondary | ICD-10-CM

## 2020-03-11 DIAGNOSIS — U071 COVID-19: Principal | ICD-10-CM

## 2020-03-11 DIAGNOSIS — J9601 Acute respiratory failure with hypoxia: Secondary | ICD-10-CM

## 2020-03-11 LAB — BRAIN NATRIURETIC PEPTIDE: B Natriuretic Peptide: 57.6 pg/mL (ref 0.0–100.0)

## 2020-03-11 LAB — CBC WITH DIFFERENTIAL/PLATELET
Abs Immature Granulocytes: 0.19 10*3/uL — ABNORMAL HIGH (ref 0.00–0.07)
Basophils Absolute: 0 10*3/uL (ref 0.0–0.1)
Basophils Relative: 0 %
Eosinophils Absolute: 0 10*3/uL (ref 0.0–0.5)
Eosinophils Relative: 0 %
HCT: 39.9 % (ref 39.0–52.0)
Hemoglobin: 13.2 g/dL (ref 13.0–17.0)
Immature Granulocytes: 2 %
Lymphocytes Relative: 1 %
Lymphs Abs: 0.1 10*3/uL — ABNORMAL LOW (ref 0.7–4.0)
MCH: 29.6 pg (ref 26.0–34.0)
MCHC: 33.1 g/dL (ref 30.0–36.0)
MCV: 89.5 fL (ref 80.0–100.0)
Monocytes Absolute: 0.4 10*3/uL (ref 0.1–1.0)
Monocytes Relative: 5 %
Neutro Abs: 7.8 10*3/uL — ABNORMAL HIGH (ref 1.7–7.7)
Neutrophils Relative %: 92 %
Platelets: 324 10*3/uL (ref 150–400)
RBC: 4.46 MIL/uL (ref 4.22–5.81)
RDW: 14.5 % (ref 11.5–15.5)
WBC: 8.5 10*3/uL (ref 4.0–10.5)
nRBC: 0 % (ref 0.0–0.2)

## 2020-03-11 LAB — COMPREHENSIVE METABOLIC PANEL
ALT: 43 U/L (ref 0–44)
AST: 32 U/L (ref 15–41)
Albumin: 3.1 g/dL — ABNORMAL LOW (ref 3.5–5.0)
Alkaline Phosphatase: 55 U/L (ref 38–126)
Anion gap: 12 (ref 5–15)
BUN: 19 mg/dL (ref 8–23)
CO2: 25 mmol/L (ref 22–32)
Calcium: 8.4 mg/dL — ABNORMAL LOW (ref 8.9–10.3)
Chloride: 101 mmol/L (ref 98–111)
Creatinine, Ser: 0.92 mg/dL (ref 0.61–1.24)
GFR calc Af Amer: >60 mL/min (ref >60)
GFR calc non Af Amer: >60 mL/min (ref >60)
Glucose, Bld: 137 mg/dL — ABNORMAL HIGH (ref 70–99)
Potassium: 4.2 mmol/L (ref 3.5–5.1)
Sodium: 138 mmol/L (ref 135–145)
Total Bilirubin: 1 mg/dL (ref 0.3–1.2)
Total Protein: 5.8 g/dL — ABNORMAL LOW (ref 6.5–8.1)

## 2020-03-11 LAB — C-REACTIVE PROTEIN: CRP: 8.3 mg/dL — ABNORMAL HIGH (ref <1.0)

## 2020-03-11 LAB — GLUCOSE, CAPILLARY
Glucose-Capillary: 93 mg/dL (ref 70–99)
Glucose-Capillary: 112 mg/dL — ABNORMAL HIGH (ref 70–99)
Glucose-Capillary: 208 mg/dL — ABNORMAL HIGH (ref 70–99)
Glucose-Capillary: 169 mg/dL — ABNORMAL HIGH (ref 70–99)

## 2020-03-11 LAB — PHOSPHORUS: Phosphorus: 4.1 mg/dL (ref 2.5–4.6)

## 2020-03-11 LAB — ECHOCARDIOGRAM LIMITED
Area-P 1/2: 4.6 cm2
Height: 72 in
S' Lateral: 3.5 cm
Weight: 4891.2 oz

## 2020-03-11 LAB — MAGNESIUM: Magnesium: 2.4 mg/dL (ref 1.7–2.4)

## 2020-03-11 LAB — D-DIMER, QUANTITATIVE: D-Dimer, Quant: 0.48 ug/mL-FEU (ref 0.00–0.50)

## 2020-03-11 MED ORDER — BARICITINIB 2 MG PO TABS
4.0000 mg | ORAL_TABLET | Freq: Every day | ORAL | Status: AC
  Administered 2020-03-11 – 2020-03-17 (×7): 4 mg via ORAL
  Filled 2020-03-11 (×7): qty 2

## 2020-03-11 MED ORDER — METHYLPREDNISOLONE SODIUM SUCC 125 MG IJ SOLR
60.0000 mg | Freq: Three times a day (TID) | INTRAMUSCULAR | Status: DC
  Administered 2020-03-11 – 2020-03-15 (×14): 60 mg via INTRAVENOUS
  Filled 2020-03-11 (×15): qty 2

## 2020-03-11 MED ORDER — FUROSEMIDE 10 MG/ML IJ SOLN
40.0000 mg | Freq: Once | INTRAMUSCULAR | Status: AC
  Administered 2020-03-11: 40 mg via INTRAVENOUS
  Filled 2020-03-11: qty 4

## 2020-03-11 NOTE — Plan of Care (Signed)
  Problem: Health Behavior/Discharge Planning: Goal: Ability to manage health-related needs will improve Outcome: Progressing   Problem: Clinical Measurements: Goal: Respiratory complications will improve Outcome: Progressing   

## 2020-03-11 NOTE — Progress Notes (Signed)
PROGRESS NOTE                                                                                                                                                                                                             Patient Demographics:    Michael Wolf, is a 64 y.o. male, DOB - 26-Dec-1955, KPQ:[id number]  Admit date - 02/14/2020   Admitting Physician Lequita Halt, MD  Outpatient Primary MD for the patient is [name], MD  LOS - 1   Chief Complaint  Patient presents with  . Shortness of Breath       Brief Narrative    Michael Wolf is a 64 y.o. male with medical history significant of HTN, HLD, IIDM, non-Hodgkin's lymphoma in remission, OSA on HS CPAP, morbid obesity and recent breakthrough COVID-19 PNA s/p baricitinib and remdesivir and was discharged home yesterday on 2-4 L of home O2. Pt reported that the O2 concentrator has the "air flow coming out" via nasal cannula, but unable to hook it to CPAP overnight, and this morning, pt felt more SOB and check his PulseOx at home showing 86%, he denied any cough, no wheezing, no chest pain, no fever or chills. He was noted to have Hypoxia O2 sat in 80s on arrival, then improved to 93-95% on 15 L. Chest Xray no significant changes compared to earlier this week.  WBC 9.5, Cre 1.24 compared to 0.98 2 days ago. procalcitonin <0.1, D-Dimer 0.74.    Subjective:    Michael Wolf today he reports dyspnea, generalized weakness and fatigue .    Assessment  & Plan :    Active Problems:   COVID-19   Non Hodgkin's lymphoma (HCC)   Obesity, Class III, BMI 40-49.9 (morbid obesity) ([place])   Hypoxia   Acute hypoxia respiratory failure due to COVID-19 pneumonia -Patient is vaccinated, but unfortunately appears to be having severe disease as evident on CT chest obtained today. -He was discharged recently on 2 L nasal oxygen, he currently presents with found hypoxia requiring 15 L NRB +15 L high  flow nasal cannula. -Was encouraged with incentive spirometry, flutter valve, out of bed to chair. -P within normal limits, but he has some trace edema, will give one dose of Lasix today. -Resumed back on IV Solu-Medrol. -Doing back on baricitinib. -Finished 5 days on remdesivir, no indication  to treat. -D-dimers within normal limit, no clinical suspicion for PE. -Echo to rule out Pulm HTN    COVID-19 Labs  Recent Labs    03/07/2020 1613 03/11/20 0103  DDIMER 0.74* 0.48  FERRITIN 751*  --   LDH 446*  --   CRP 4.8* 8.3*    Lab Results  Component Value Date   SARSCOV2NAA POSITIVE (A) 03/03/2020   Hypertension /borderline hypotension  -Patient with history of hypertension, continue to hold home medication -Received albumin  -We will give one dose of Lasix today, monitor.  HLD -On Statin and Ezetimibe  Non-Hodgkin's Lymphoma -Remission since 2018  OSA -As above, he is with significant oxygen requirement, one be able to straight CPAP currently .  Morbid Obesity -Close recommended  Type 2 diabetes mellitus -Hold Metformin, continue with insulin sliding scale.   Code Status : Full  Family Communication  : D/W wife by phone.  Disposition Plan  :  Status is: Inpatient  Remains inpatient appropriate because:Hemodynamically unstable and IV treatments appropriate due to intensity of illness or inability to take PO   Dispo: The patient is from: Home              Anticipated d/c is to: Home              Anticipated d/c date is: > 3 days              Patient currently is not medically stable to d/c.       Consults  :  none  Procedures  : none  DVT Prophylaxis  :   lovenox  Lab Results  Component Value Date   PLT 324 03/11/2020    Antibiotics  :    Anti-infectives (From admission, onward)   None        Objective:   Vitals:   03/11/20 0400 03/11/20 0747 03/11/20 0850 03/11/20 1212  BP: 124/82 117/66  122/68  Pulse: 72 84 95 86  Resp:  (!) 21 18 (!) 22 (!) 21  Temp: 98 F (36.7 C) 99.2 F (37.3 C)  98.8 F (37.1 C)  TempSrc: Oral Oral  Oral  SpO2: 94% 93% 94% 90%  Weight:      Height:        Wt Readings from Last 3 Encounters:  02/17/2020 (!) 138.7 kg  03/03/20 136.1 kg  12/01/19 (!) 149.5 kg     Intake/Output Summary (Last 24 hours) at 03/11/2020 1303 Last data filed at 03/11/2020 1100 Gross per 24 hour  Intake 949.19 ml  Output 950 ml  Net -0.81 ml     Physical Exam  Awake Alert, Oriented X 3, No new F.N deficits, Normal affect Symmetrical Chest wall movement, Good air movement bilaterally, CTAB RRR,No Gallops,Rubs or new Murmurs, No Parasternal Heave +ve B.Sounds, Abd Soft, No tenderness, No rebound - guarding or rigidity. No Cyanosis, Clubbing , trace  edema, No new Rash or bruise      Data Review:    CBC Recent Labs  Lab 03/06/20 0426 03/07/20 1014 03/08/20 0934 02/15/2020 1613 03/11/20 0103  WBC 8.3 10.1 9.6 9.5 8.5  HGB 12.9* 13.1 13.7 13.6 13.2  HCT 40.3 41.3 42.4 42.6 39.9  PLT 334 356 375 360 324  MCV 91.8 91.6 90.8 90.6 89.5  MCH 29.4 29.0 29.3 28.9 29.6  MCHC 32.0 31.7 32.3 31.9 33.1  RDW 14.3 14.1 14.3 14.5 14.5  LYMPHSABS 0.2* 0.2* 0.1* 0.1* 0.1*  MONOABS 0.4 0.5 0.3 0.7 0.4  EOSABS  0.0 0.0 0.0 0.0 0.0  BASOSABS 0.0 0.0 0.0 0.0 0.0    Chemistries  Recent Labs  Lab 03/05/20 0249 03/05/20 0249 03/06/20 0426 03/07/20 1014 03/08/20 0934 03/11/2020 1613 03/11/20 0103  NA 138   < > 141 141 139 136 138  K 4.3   < > 4.6 4.4 4.1 4.1 4.2  CL 102   < > 101 101 100 102 101  CO2 27   < > 28 29 25 23 25   GLUCOSE 190*   < > 175* 212* 252* 132* 137*  BUN 22   < > 23 21 21 21 19   CREATININE 0.97   < > 1.09 0.95 0.98 1.25* 0.92  CALCIUM 8.4*   < > 8.5* 8.2* 8.1* 8.0* 8.4*  MG 2.4  --  2.3 2.3 2.2  --  2.4  AST 95*   < > 67* 51* 43* 39 32  ALT 85*   < > 77* 74* 70* 49* 43  ALKPHOS 59   < > 54 54 62 61 55  BILITOT 0.3   < > 0.6 0.6 1.0 0.6 1.0   < > = values in this interval not  displayed.   ------------------------------------------------------------------------------------------------------------------ Recent Labs    02/15/2020 1613  TRIG 122    Lab Results  Component Value Date   HGBA1C 6.9 (H) 03/08/2020   ------------------------------------------------------------------------------------------------------------------ No results for input(s): TSH, T4TOTAL, T3FREE, THYROIDAB in the last 72 hours.  Invalid input(s): FREET3 ------------------------------------------------------------------------------------------------------------------ Recent Labs    02/25/2020 1613  FERRITIN 751*    Coagulation profile No results for input(s): INR, PROTIME in the last 168 hours.  Recent Labs    03/11/2020 1613 03/11/20 0103  DDIMER 0.74* 0.48    Cardiac Enzymes No results for input(s): CKMB, TROPONINI, MYOGLOBIN in the last 168 hours.  Invalid input(s): CK ------------------------------------------------------------------------------------------------------------------    Component Value Date/Time   BNP 57.6 03/11/2020 0103    Inpatient Medications  Scheduled Meds: . baricitinib  4 mg Oral Daily  . enoxaparin (LOVENOX) injection  70 mg Subcutaneous Q24H  . ezetimibe  10 mg Oral Daily  . furosemide  40 mg Intravenous Once  . gabapentin  300 mg Oral Q12H  . insulin aspart  0-15 Units Subcutaneous TID WC  . loratadine  10 mg Oral Daily  . methylPREDNISolone (SOLU-MEDROL) injection  60 mg Intravenous Q8H  . pantoprazole  40 mg Oral Daily  . rosuvastatin  40 mg Oral Daily   Continuous Infusions: . albumin human Stopped (03/11/20 1048)   PRN Meds:.clonazePAM  Micro Results Recent Results (from the past 240 hour(s))  SARS Coronavirus 2 by RT PCR (hospital order, performed in [place] hospital lab) Nasopharyngeal     Status: Abnormal   Collection Time: 03/03/20  3:26 PM   Specimen: Nasopharyngeal  Result Value Ref Range Status   SARS Coronavirus  2 POSITIVE (A) NEGATIVE Final    Comment: RESULT CALLED TO, READ BACK BY AND VERIFIED WITH: MARVA SIMMS RN @1656  03/03/2020 OLSONM (NOTE) SARS-CoV-2 target nucleic acids are DETECTED  SARS-CoV-2 RNA is generally detectable in upper respiratory specimens  during the acute phase of infection.  Positive results are indicative  of the presence of the identified virus, but do not rule out bacterial infection or co-infection with other pathogens not detected by the test.  Clinical correlation with patient history and  other diagnostic information is necessary to determine patient infection status.  The expected result is negative.  Fact Sheet for Patients:   StrictlyIdeas.no  Fact Sheet for Healthcare Providers:   BankingDealers.co.za    This test is not yet approved or cleared by the [place] FDA and  has been authorized for detection and/or diagnosis of SARS-CoV-2 by FDA under an Emergency Use Authorization (EUA).  This EUA will remain in effect (meaning thi s test can be used) for the duration of  the COVID-19 declaration under Section 564(b)(1) of the Act, 21 U.S.C. section 360-bbb-3(b)(1), unless the authorization is terminated or revoked sooner.  Performed at [facility], [address] [place], [place] [address]   Blood Culture (routine x 2)     Status: None   Collection Time: 03/03/20  3:27 PM   Specimen: BLOOD LEFT ARM  Result Value Ref Range Status   Specimen Description   Final    BLOOD LEFT ARM Performed at [facility], [address] [place], [place] [address]    Special Requests   Final    BOTTLES DRAWN AEROBIC AND ANAEROBIC Blood Culture adequate volume Performed at [facility], [address] [place], [place] [address]    Culture   Final    NO GROWTH 5 DAYS Performed at [facility], [place] [address], [place], [place] [address]    Report Status 03/08/2020 FINAL   Final  Blood Culture (routine x 2)     Status: None   Collection Time: 03/03/20  3:40 PM   Specimen: BLOOD LEFT HAND  Result Value Ref Range Status   Specimen Description   Final    BLOOD LEFT HAND Performed at [facility], [address] [place], [place] [address]    Special Requests   Final    BOTTLES DRAWN AEROBIC AND ANAEROBIC Blood Culture adequate volume Performed at [facility], [address] [place], [place] [address]    Culture   Final    NO GROWTH 5 DAYS Performed at [facility], [place] [address], [place], [place] [address]    Report Status 03/08/2020 FINAL  Final  Blood Culture (routine x 2)     Status: None (Preliminary result)   Collection Time: 03/13/2020  4:12 PM   Specimen: BLOOD  Result Value Ref Range Status   Specimen Description BLOOD RIGHT ANTECUBITAL  Final   Special Requests   Final    BOTTLES DRAWN AEROBIC AND ANAEROBIC Blood Culture results may not be optimal due to an inadequate volume of blood received in culture bottles   Culture   Final    NO GROWTH < 24 HOURS Performed at [facility], [place] [address] [place], [place] [address]    Report Status PENDING  Incomplete  Blood Culture (routine x 2)     Status: None (Preliminary result)   Collection Time: 03/11/2020  8:12 PM   Specimen: BLOOD LEFT HAND  Result Value Ref Range Status   Specimen Description BLOOD LEFT HAND  Final   Special Requests   Final    BOTTLES DRAWN AEROBIC AND ANAEROBIC Blood Culture adequate volume   Culture   Final    NO GROWTH < 12 HOURS Performed at [facility], [place] [address], [place], [place] [address]    Report Status PENDING  Incomplete    Radiology Reports CT CHEST WO CONTRAST  Result Date: 03/11/2020 CLINICAL DATA:  Pneumonia, unresolved. History of non-Hodgkin's lymphoma. Positive for coronavirus. EXAM: CT CHEST WITHOUT CONTRAST TECHNIQUE: Multidetector CT imaging of the  chest was performed following the standard protocol  without IV contrast. COMPARISON:  Chest radiograph 02/16/2020 FINDINGS: Cardiovascular: Normal heart size. No pericardial effusion. Aortic atherosclerosis. Coronary artery calcifications. Mediastinum/Nodes: No enlarged mediastinal or axillary lymph nodes. Thyroid gland, trachea, and esophagus demonstrate no significant findings. Lungs/Pleura: There is no pleural effusion. Extensive, bilateral pulmonary ground-glass opacification with patchy areas of nodular consolidation compatible with atypical viral pneumonia. There is a subpleural bleb identified within the posterior left lung base measuring 5.6 cm, image 145/6. Upper Abdomen: Multiple left lobe of liver cyst noted measuring up to 2.8 cm. No pain acute finding noted within the imaged portions of the upper abdomen. Musculoskeletal: No chest wall mass or suspicious bone lesions identified. IMPRESSION: 1. Extensive, bilateral pulmonary ground-glass opacification with patchy areas of nodular consolidation compatible with atypical viral pneumonia. 2. Coronary artery calcifications noted. 3. Aortic atherosclerosis. Aortic Atherosclerosis (ICD10-I70.0). Electronically Signed   By: Kerby Moors M.D.   On: 03/11/2020 11:37   DG Chest Port 1 View  Result Date: 02/28/2020 CLINICAL DATA:  Short of breath EXAM: PORTABLE CHEST 1 VIEW COMPARISON:  03/05/2020 FINDINGS: Progression of right lower lobe airspace disease. Cardiac silhouette upper normal. Negative for heart failure. Left lung clear. Probable underlying COPD IMPRESSION: COPD.  Progressive right lower lobe infiltrate. Electronically Signed   By: Franchot Gallo M.D.   On: 03/09/2020 16:17   DG Chest Port 1 View  Result Date: 03/05/2020 CLINICAL DATA:  COVID-19. EXAM: PORTABLE CHEST 1 VIEW COMPARISON:  March 03, 2020 FINDINGS: New hazy infiltrates are seen in the lungs, particularly the upper lobes. The cardiomediastinal silhouette is stable. No pneumothorax. No other acute abnormalities. IMPRESSION: New hazy  infiltrates, particularly in the upper lobes, worrisome for developing COVID-19 pneumonia given history. Electronically Signed   By: Dorise Bullion III M.D   On: 03/05/2020 08:38   DG Chest Port 1 View  Result Date: 03/03/2020 CLINICAL DATA:  COVID-19 diagnosed 2 weeks ago, increasing shortness of breath, hypoxemia EXAM: PORTABLE CHEST 1 VIEW COMPARISON:  None. FINDINGS: Single frontal view of the chest demonstrates an unremarkable cardiac silhouette. Diffuse interstitial prominence is seen, with faint ground-glass opacities within the mid and lower lung zones. No effusion or pneumothorax. No acute bony abnormality. IMPRESSION: 1. Interstitial prominence with bilateral ground-glass airspace disease. Differential includes multifocal atypical pneumonia versus edema. Electronically Signed   By: Randa Ngo M.D.   On: 03/03/2020 15:43     Phillips Climes M.D on 03/11/2020 at 1:03 PM    [facility] -  Office  [phone]

## 2020-03-11 NOTE — Plan of Care (Signed)
  Problem: Health Behavior/Discharge Planning: Goal: Ability to manage health-related needs will improve Outcome: Progressing   Problem: Clinical Measurements: Goal: Diagnostic test results will improve Outcome: Progressing Goal: Respiratory complications will improve Outcome: Progressing   Problem: Activity: Goal: Risk for activity intolerance will decrease Outcome: Progressing   Problem: Nutrition: Goal: Adequate nutrition will be maintained Outcome: Progressing   Problem: Elimination: Goal: Will not experience complications related to bowel motility Outcome: Progressing Goal: Will not experience complications related to urinary retention Outcome: Progressing   Problem: Pain Managment: Goal: General experience of comfort will improve Outcome: Progressing   Problem: Skin Integrity: Goal: Risk for impaired skin integrity will decrease Outcome: Progressing

## 2020-03-11 NOTE — Progress Notes (Signed)
Patient currently on Non-rebreather and 15L HFNC, CPAP cannot be placed due to high oxygen need. RT will continue to monitor patient.

## 2020-03-11 NOTE — Progress Notes (Signed)
  Echocardiogram 2D Echocardiogram has been performed.  Michael Wolf 03/11/2020, 2:49 PM

## 2020-03-12 LAB — COMPREHENSIVE METABOLIC PANEL
ALT: 45 U/L — ABNORMAL HIGH (ref 0–44)
AST: 39 U/L (ref 15–41)
Albumin: 3.4 g/dL — ABNORMAL LOW (ref 3.5–5.0)
Alkaline Phosphatase: 53 U/L (ref 38–126)
Anion gap: 11 (ref 5–15)
BUN: 24 mg/dL — ABNORMAL HIGH (ref 8–23)
CO2: 29 mmol/L (ref 22–32)
Calcium: 8.8 mg/dL — ABNORMAL LOW (ref 8.9–10.3)
Chloride: 98 mmol/L (ref 98–111)
Creatinine, Ser: 1.11 mg/dL (ref 0.61–1.24)
GFR calc Af Amer: >60 mL/min (ref >60)
GFR calc non Af Amer: >60 mL/min (ref >60)
Glucose, Bld: 194 mg/dL — ABNORMAL HIGH (ref 70–99)
Potassium: 4.1 mmol/L (ref 3.5–5.1)
Sodium: 138 mmol/L (ref 135–145)
Total Bilirubin: 1.1 mg/dL (ref 0.3–1.2)
Total Protein: 5.9 g/dL — ABNORMAL LOW (ref 6.5–8.1)

## 2020-03-12 LAB — CBC WITH DIFFERENTIAL/PLATELET
Abs Immature Granulocytes: 0.12 10*3/uL — ABNORMAL HIGH (ref 0.00–0.07)
Basophils Absolute: 0 10*3/uL (ref 0.0–0.1)
Basophils Relative: 0 %
Eosinophils Absolute: 0 10*3/uL (ref 0.0–0.5)
Eosinophils Relative: 0 %
HCT: 39.7 % (ref 39.0–52.0)
Hemoglobin: 12.8 g/dL — ABNORMAL LOW (ref 13.0–17.0)
Immature Granulocytes: 1 %
Lymphocytes Relative: 2 %
Lymphs Abs: 0.2 10*3/uL — ABNORMAL LOW (ref 0.7–4.0)
MCH: 28.9 pg (ref 26.0–34.0)
MCHC: 32.2 g/dL (ref 30.0–36.0)
MCV: 89.6 fL (ref 80.0–100.0)
Monocytes Absolute: 0.3 10*3/uL (ref 0.1–1.0)
Monocytes Relative: 3 %
Neutro Abs: 9.1 10*3/uL — ABNORMAL HIGH (ref 1.7–7.7)
Neutrophils Relative %: 94 %
Platelets: 357 10*3/uL (ref 150–400)
RBC: 4.43 MIL/uL (ref 4.22–5.81)
RDW: 14.3 % (ref 11.5–15.5)
WBC: 9.7 10*3/uL (ref 4.0–10.5)
nRBC: 0 % (ref 0.0–0.2)

## 2020-03-12 LAB — D-DIMER, QUANTITATIVE: D-Dimer, Quant: 0.39 ug/mL-FEU (ref 0.00–0.50)

## 2020-03-12 LAB — C-REACTIVE PROTEIN: CRP: 6.1 mg/dL — ABNORMAL HIGH (ref <1.0)

## 2020-03-12 LAB — GLUCOSE, CAPILLARY
Glucose-Capillary: 144 mg/dL — ABNORMAL HIGH (ref 70–99)
Glucose-Capillary: 181 mg/dL — ABNORMAL HIGH (ref 70–99)
Glucose-Capillary: 179 mg/dL — ABNORMAL HIGH (ref 70–99)
Glucose-Capillary: 204 mg/dL — ABNORMAL HIGH (ref 70–99)

## 2020-03-12 MED ORDER — FUROSEMIDE 10 MG/ML IJ SOLN
20.0000 mg | Freq: Once | INTRAMUSCULAR | Status: AC
  Administered 2020-03-12: 20 mg via INTRAVENOUS
  Filled 2020-03-12: qty 2

## 2020-03-12 MED ORDER — VENLAFAXINE HCL ER 75 MG PO CP24
75.0000 mg | ORAL_CAPSULE | Freq: Every day | ORAL | Status: DC
  Administered 2020-03-12 – 2020-03-24 (×13): 75 mg via ORAL
  Filled 2020-03-12 (×13): qty 1

## 2020-03-12 NOTE — Progress Notes (Signed)
PROGRESS NOTE                                                                                                                                                                                                             Patient Demographics:    Michael Wolf, is a 64 y.o. male, DOB - 07/22/1955, CBU:[id number]  Admit date - 03/11/2020   Admitting Physician Lequita Halt, MD  Outpatient Primary MD for the patient is [name], MD  LOS - 2   Chief Complaint  Patient presents with  . Shortness of Breath       Brief Narrative    Michael Wolf is a 64 y.o. male with medical history significant of HTN, HLD, IIDM, non-Hodgkin's lymphoma in remission, OSA on HS CPAP, morbid obesity and recent breakthrough COVID-19 PNA s/p baricitinib and remdesivir and was discharged home yesterday on 2-4 L of home O2. Pt reported that the O2 concentrator has the "air flow coming out" via nasal cannula, but unable to hook it to CPAP overnight, and this morning, pt felt more SOB and check his PulseOx at home showing 86%, he denied any cough, no wheezing, no chest pain, no fever or chills. He was noted to have Hypoxia O2 sat in 80s on arrival, then improved to 93-95% on 15 L. Chest Xray no significant changes compared to earlier this week.  WBC 9.5, Cre 1.24 compared to 0.98 2 days ago. procalcitonin <0.1, D-Dimer 0.74.    Subjective:    Michael Wolf today reports dyspnea has improved, reports generalized weakness and fatigue.  He reports dyspnea, generalized weakness and fatigue .    Assessment  & Plan :    Active Problems:   COVID-19   Non Hodgkin's lymphoma ([place])   Obesity, Class III, BMI 40-49.9 (morbid obesity) ([place])   Hypoxia   Acute hypoxia respiratory failure due to COVID-19 pneumonia -Patient is vaccinated, but unfortunately appears to be having severe disease as evident on CT chest obtained. -He was discharged recently on 2 L nasal oxygen, with  significant hypoxia on readmission requiring 15 L NRB +15 L high flow nasal cannula.  He is currently tolerating 15 L high flow nasal cannula alone. -Was encouraged with incentive spirometry, flutter valve, out of bed to chair. -Resumed back on IV Solu-Medrol. -Resumed back on baricitinib. -Finished 5 days on  remdesivir, no indication to repeat -D-dimers within normal limit, no clinical suspicion for PE. -Continue with diuresis as needed.    COVID-19 Labs  Recent Labs    02/23/2020 1613 03/11/20 0103 03/12/20 0320  DDIMER 0.74* 0.48 0.39  FERRITIN 751*  --   --   LDH 446*  --   --   CRP 4.8* 8.3* 6.1*    Lab Results  Component Value Date   SARSCOV2NAA POSITIVE (A) 03/03/2020   Hypertension /borderline hypotension  -Patient with history of hypertension, continue to hold home medication -Received albumin  -On as needed diuresis -Blood pressure remains low, will start on low-dose Midodrin.  HLD -On Statin and Ezetimibe  Non-Hodgkin's Lymphoma -Remission since 2018  OSA -As above, he is with significant oxygen requirement, one be able to straight CPAP currently .  Morbid Obesity -Weight loss recommended  Type 2 diabetes mellitus -Hold Metformin, continue with insulin sliding scale.   Code Status : Full  Family Communication  : D/W wife by phone.  Disposition Plan  :  Status is: Inpatient  Remains inpatient appropriate because:Hemodynamically unstable and IV treatments appropriate due to intensity of illness or inability to take PO   Dispo: The patient is from: Home              Anticipated d/c is to: Home              Anticipated d/c date is: > 3 days              Patient currently is not medically stable to d/c.       Consults  :  none  Procedures  : none  DVT Prophylaxis  :  [place] lovenox  Lab Results  Component Value Date   PLT 357 03/12/2020    Antibiotics  :    Anti-infectives (From admission, onward)   None        Objective:    Vitals:   03/11/20 2020 03/11/20 2350 03/12/20 0428 03/12/20 1209  BP:  111/73 119/75 104/64  Pulse:  74 74 84  Resp:  20 19 17   Temp: 98.3 F (36.8 C) 98.6 F (37 C) 98.7 F (37.1 C) 98.7 F (37.1 C)  TempSrc: Oral Axillary Axillary Oral  SpO2:  91% 93% (!) 88%  Weight:      Height:        Wt Readings from Last 3 Encounters:  03/04/2020 (!) 138.7 kg  03/03/20 136.1 kg  12/01/19 (!) 149.5 kg     Intake/Output Summary (Last 24 hours) at 03/12/2020 1340 Last data filed at 03/12/2020 0946 Gross per 24 hour  Intake 75.09 ml  Output 1600 ml  Net -1524.91 ml     Physical Exam  Awake Alert, Oriented X 3, No new F.N deficits, Normal affect Symmetrical Chest wall movement, Good air movement bilaterally, CTAB RRR,No Gallops,Rubs or new Murmurs, No Parasternal Heave +ve B.Sounds, Abd Soft, No tenderness, No rebound - guarding or rigidity. No Cyanosis, Clubbing or edema, No new Rash or bruise       Data Review:    CBC Recent Labs  Lab 03/07/20 1014 03/08/20 0934 03/01/2020 1613 03/11/20 0103 03/12/20 0320  WBC 10.1 9.6 9.5 8.5 9.7  HGB 13.1 13.7 13.6 13.2 12.8*  HCT 41.3 42.4 42.6 39.9 39.7  PLT 356 375 360 324 357  MCV 91.6 90.8 90.6 89.5 89.6  MCH 29.0 29.3 28.9 29.6 28.9  MCHC 31.7 32.3 31.9 33.1 32.2  RDW 14.1 14.3 14.5 14.5  14.3  LYMPHSABS 0.2* 0.1* 0.1* 0.1* 0.2*  MONOABS 0.5 0.3 0.7 0.4 0.3  EOSABS 0.0 0.0 0.0 0.0 0.0  BASOSABS 0.0 0.0 0.0 0.0 0.0    Chemistries  Recent Labs  Lab 03/06/20 0426 03/06/20 0426 03/07/20 1014 03/08/20 0934 02/25/2020 1613 03/11/20 0103 03/12/20 0320  NA 141   < > 141 139 136 138 138  K 4.6   < > 4.4 4.1 4.1 4.2 4.1  CL 101   < > 101 100 102 101 98  CO2 28   < > 29 25 23 25 29   GLUCOSE 175*   < > 212* 252* 132* 137* 194*  BUN 23   < > 21 21 21 19  24*  CREATININE 1.09   < > 0.95 0.98 1.25* 0.92 1.11  CALCIUM 8.5*   < > 8.2* 8.1* 8.0* 8.4* 8.8*  MG 2.3  --  2.3 2.2  --  2.4  --   AST 67*   < > 51* 43* 39 32 39   ALT 77*   < > 74* 70* 49* 43 45*  ALKPHOS 54   < > 54 62 61 55 53  BILITOT 0.6   < > 0.6 1.0 0.6 1.0 1.1   < > = values in this interval not displayed.   ------------------------------------------------------------------------------------------------------------------ Recent Labs    03/09/2020 1613  TRIG 122    Lab Results  Component Value Date   HGBA1C 6.9 (H) 03/08/2020   ------------------------------------------------------------------------------------------------------------------ No results for input(s): TSH, T4TOTAL, T3FREE, THYROIDAB in the last 72 hours.  Invalid input(s): FREET3 ------------------------------------------------------------------------------------------------------------------ Recent Labs    02/23/2020 1613  FERRITIN 751*    Coagulation profile No results for input(s): INR, PROTIME in the last 168 hours.  Recent Labs    03/11/20 0103 03/12/20 0320  DDIMER 0.48 0.39    Cardiac Enzymes No results for input(s): CKMB, TROPONINI, MYOGLOBIN in the last 168 hours.  Invalid input(s): CK ------------------------------------------------------------------------------------------------------------------    Component Value Date/Time   BNP 57.6 03/11/2020 0103    Inpatient Medications  Scheduled Meds: . baricitinib  4 mg Oral Daily  . enoxaparin (LOVENOX) injection  70 mg Subcutaneous Q24H  . ezetimibe  10 mg Oral Daily  . gabapentin  300 mg Oral Q12H  . insulin aspart  0-15 Units Subcutaneous TID WC  . loratadine  10 mg Oral Daily  . methylPREDNISolone (SOLU-MEDROL) injection  60 mg Intravenous Q8H  . pantoprazole  40 mg Oral Daily  . rosuvastatin  40 mg Oral Daily  . venlafaxine XR  75 mg Oral Q breakfast   Continuous Infusions:  PRN Meds:.clonazePAM  Micro Results Recent Results (from the past 240 hour(s))  SARS Coronavirus 2 by RT PCR (hospital order, performed in [facility] hospital lab) Nasopharyngeal     Status: Abnormal    Collection Time: 03/03/20  3:26 PM   Specimen: Nasopharyngeal  Result Value Ref Range Status   SARS Coronavirus 2 POSITIVE (A) NEGATIVE Final    Comment: RESULT CALLED TO, Wolf BACK BY AND VERIFIED WITH: MARVA SIMMS RN @1656  03/03/2020 OLSONM (NOTE) SARS-CoV-2 target nucleic acids are DETECTED  SARS-CoV-2 RNA is generally detectable in upper respiratory specimens  during the acute phase of infection.  Positive results are indicative  of the presence of the identified virus, but do not rule out bacterial infection or co-infection with other pathogens not detected by the test.  Clinical correlation with patient history and  other diagnostic information is necessary to determine patient infection status.  The expected result is negative.  Fact Sheet for Patients:   StrictlyIdeas.no   Fact Sheet for Healthcare Providers:   BankingDealers.co.za    This test is not yet approved or cleared by the [place] FDA and  has been authorized for detection and/or diagnosis of SARS-CoV-2 by FDA under an Emergency Use Authorization (EUA).  This EUA will remain in effect (meaning thi s test can be used) for the duration of  the COVID-19 declaration under Section 564(b)(1) of the Act, 21 U.S.C. section 360-bbb-3(b)(1), unless the authorization is terminated or revoked sooner.  Performed at [facility], [address] [place], [place] [address]   Blood Culture (routine x 2)     Status: None   Collection Time: 03/03/20  3:27 PM   Specimen: BLOOD LEFT ARM  Result Value Ref Range Status   Specimen Description   Final    BLOOD LEFT ARM Performed at [facility], [address] [place], [place] [address]    Special Requests   Final    BOTTLES DRAWN AEROBIC AND ANAEROBIC Blood Culture adequate volume Performed at [facility], [address] [place], [place] [address]    Culture   Final    NO GROWTH 5  DAYS Performed at [facility], [place] [address], [place], [place] [address]    Report Status 03/08/2020 FINAL  Final  Blood Culture (routine x 2)     Status: None   Collection Time: 03/03/20  3:40 PM   Specimen: BLOOD LEFT HAND  Result Value Ref Range Status   Specimen Description   Final    BLOOD LEFT HAND Performed at [facility], [address] [place], [place] [address]    Special Requests   Final    BOTTLES DRAWN AEROBIC AND ANAEROBIC Blood Culture adequate volume Performed at [facility], [address] [place], [place] [address]    Culture   Final    NO GROWTH 5 DAYS Performed at [facility], [place] [address] [place], [place] [address]    Report Status 03/08/2020 FINAL  Final  Blood Culture (routine x 2)     Status: None (Preliminary result)   Collection Time: 02/24/2020  4:12 PM   Specimen: BLOOD  Result Value Ref Range Status   Specimen Description BLOOD RIGHT ANTECUBITAL  Final   Special Requests   Final    BOTTLES DRAWN AEROBIC AND ANAEROBIC Blood Culture results may not be optimal due to an inadequate volume of blood received in culture bottles   Culture   Final    NO GROWTH < 24 HOURS Performed at [facility], [place] [address] [place], [place] [address]    Report Status PENDING  Incomplete  Blood Culture (routine x 2)     Status: None (Preliminary result)   Collection Time: 02/18/2020  8:12 PM   Specimen: BLOOD LEFT HAND  Result Value Ref Range Status   Specimen Description BLOOD LEFT HAND  Final   Special Requests   Final    BOTTLES DRAWN AEROBIC AND ANAEROBIC Blood Culture adequate volume   Culture   Final    NO GROWTH < 12 HOURS Performed at [facility], [place] [address] [place],  [address]    Report Status PENDING  Incomplete    Radiology Reports CT CHEST WO CONTRAST  Result Date: 03/11/2020 CLINICAL DATA:  Pneumonia, unresolved. History of non-Hodgkin's  lymphoma. Positive for coronavirus. EXAM: CT  CHEST WITHOUT CONTRAST TECHNIQUE: Multidetector CT imaging of the chest was performed following the standard protocol without IV contrast. COMPARISON:  Chest radiograph 03/14/2020 FINDINGS: Cardiovascular: Normal heart size. No pericardial effusion. Aortic atherosclerosis. Coronary artery calcifications. Mediastinum/Nodes: No enlarged mediastinal or axillary lymph nodes. Thyroid gland, trachea, and esophagus demonstrate no significant findings. Lungs/Pleura: There is no pleural effusion. Extensive, bilateral pulmonary ground-glass opacification with patchy areas of nodular consolidation compatible with atypical viral pneumonia. There is a subpleural bleb identified within the posterior left lung base measuring 5.6 cm, image 145/6. Upper Abdomen: Multiple left lobe of liver cyst noted measuring up to 2.8 cm. No pain acute finding noted within the imaged portions of the upper abdomen. Musculoskeletal: No chest wall mass or suspicious bone lesions identified. IMPRESSION: 1. Extensive, bilateral pulmonary ground-glass opacification with patchy areas of nodular consolidation compatible with atypical viral pneumonia. 2. Coronary artery calcifications noted. 3. Aortic atherosclerosis. Aortic Atherosclerosis (ICD10-I70.0). Electronically Signed   By: Kerby Moors M.D.   On: 03/11/2020 11:37   DG Chest Port 1 View  Result Date: 02/24/2020 CLINICAL DATA:  Short of breath EXAM: PORTABLE CHEST 1 VIEW COMPARISON:  03/05/2020 FINDINGS: Progression of right lower lobe airspace disease. Cardiac silhouette upper normal. Negative for heart failure. Left lung clear. Probable underlying COPD IMPRESSION: COPD.  Progressive right lower lobe infiltrate. Electronically Signed   By: Franchot Gallo M.D.   On: 02/23/2020 16:17   DG Chest Port 1 View  Result Date: 03/05/2020 CLINICAL DATA:  COVID-19. EXAM: PORTABLE CHEST 1 VIEW COMPARISON:  March 03, 2020 FINDINGS: New hazy infiltrates are seen in the lungs, particularly the upper  lobes. The cardiomediastinal silhouette is stable. No pneumothorax. No other acute abnormalities. IMPRESSION: New hazy infiltrates, particularly in the upper lobes, worrisome for developing COVID-19 pneumonia given history. Electronically Signed   By: Dorise Bullion III M.D   On: 03/05/2020 08:38   DG Chest Port 1 View  Result Date: 03/03/2020 CLINICAL DATA:  COVID-19 diagnosed 2 weeks ago, increasing shortness of breath, hypoxemia EXAM: PORTABLE CHEST 1 VIEW COMPARISON:  None. FINDINGS: Single frontal view of the chest demonstrates an unremarkable cardiac silhouette. Diffuse interstitial prominence is seen, with faint ground-glass opacities within the mid and lower lung zones. No effusion or pneumothorax. No acute bony abnormality. IMPRESSION: 1. Interstitial prominence with bilateral ground-glass airspace disease. Differential includes multifocal atypical pneumonia versus edema. Electronically Signed   By: Randa Ngo M.D.   On: 03/03/2020 15:43   ECHOCARDIOGRAM LIMITED  Result Date: 03/11/2020    ECHOCARDIOGRAM LIMITED REPORT   Patient Name:   MIKIAS LANZ Date of Exam: 03/11/2020 Medical Rec #:  [id number]       Height:       72.0 in Accession #:    [id number]      Weight:       305.7 lb Date of Birth:  06/21/1956       BSA:          2.552 m Patient Age:    74 years        BP:           122/68 mmHg Patient Gender: M               HR:           85 bpm. Exam Location:  Inpatient Procedure: Limited Echo, Color Doppler and Cardiac Doppler Indications:   pulmonary hypertension 416.8  History:  Patient has no prior history of Echocardiogram examinations.                Covid.  Sonographer:   Johny Chess Referring      [id number] Lequita Halt Phys: IMPRESSIONS  1. Left ventricular ejection fraction, by estimation, is 55 to 60%. The left ventricle has normal function. The left ventricle has no regional wall motion abnormalities. Left ventricular diastolic parameters were normal.  2. Right  ventricular systolic function is normal. The right ventricular size is mildly enlarged. Tricuspid regurgitation signal is inadequate for assessing PA pressure.  3. The aortic valve is tricuspid. Aortic valve regurgitation is not visualized. Mild aortic valve sclerosis is present, with no evidence of aortic valve stenosis.  4. The inferior vena cava is normal in size with greater than 50% respiratory variability, suggesting right atrial pressure of 3 mmHg. FINDINGS  Left Ventricle: Left ventricular ejection fraction, by estimation, is 55 to 60%. The left ventricle has normal function. The left ventricle has no regional wall motion abnormalities. The left ventricular internal cavity size was normal in size. There is  borderline left ventricular hypertrophy. Right Ventricle: The right ventricular size is mildly enlarged. No increase in right ventricular wall thickness. Right ventricular systolic function is normal. Tricuspid regurgitation signal is inadequate for assessing PA pressure. Pericardium: There is no evidence of pericardial effusion. Presence of pericardial fat pad. Tricuspid Valve: The tricuspid valve is grossly normal. Tricuspid valve regurgitation is trivial. Aortic Valve: The aortic valve is tricuspid. Aortic valve regurgitation is not visualized. Mild aortic valve sclerosis is present, with no evidence of aortic valve stenosis. Mild aortic valve annular calcification. Pulmonic Valve: The pulmonic valve was grossly normal. Pulmonic valve regurgitation is trivial. Venous: The inferior vena cava is normal in size with greater than 50% respiratory variability, suggesting right atrial pressure of 3 mmHg. IAS/Shunts: No atrial level shunt detected by color flow Doppler. LEFT VENTRICLE PLAX 2D LVIDd:         5.00 cm  Diastology LVIDs:         3.50 cm  LV e' lateral:   13.90 cm/s LV PW:         1.10 cm  LV E/e' lateral: 5.1 LV IVS:        1.00 cm  LV e' medial:    12.60 cm/s LVOT diam:     2.30 cm  LV E/e'  medial:  5.6 LV SV:         85 LV SV Index:   33 LVOT Area:     4.15 cm  IVC IVC diam: 1.90 cm LEFT ATRIUM         Index LA diam:    4.00 cm 1.57 cm/m  AORTIC VALVE LVOT Vmax:   106.00 cm/s LVOT Vmean:  68.700 cm/s LVOT VTI:    0.205 m MITRAL VALVE MV Area (PHT): 4.60 cm    SHUNTS MV Decel Time: 165 msec    Systemic VTI:  0.20 m MV E velocity: 70.30 cm/s  Systemic Diam: 2.30 cm MV A velocity: 90.80 cm/s MV E/A ratio:  0.77 Rozann Lesches MD Electronically signed by Rozann Lesches MD Signature Date/Time: 03/11/2020/4:40:56 PM    Final      Phillips Climes M.D on 03/12/2020 at 1:40 PM    [facility] -  Office  [phone]

## 2020-03-12 NOTE — Evaluation (Signed)
Physical Therapy Evaluation Patient Details Name: Michael Wolf MRN: [id number] DOB: 01-Jun-1956 Today's Date: 03/12/2020   History of Present Illness  Michael Wolf is a 64 y.o. male with medical history significant of HTN, HLD, IIDM, non-Hodgkin's lymphoma in remission, OSA on HS CPAP, morbid obesity and recent breakthrough COVID-19 PNA s/p baricitinib and remdesivir and was discharged home 8/26 on 2-4 L of home O2. Pt with readmission with significant hypoxia requiring 15 L NRB + 15L HFNC.   Clinical Impression  Pt received sitting up in chair, SpO2 > 90% on 15L HFNC. RN refilled humidifier, and once she left the room, pt with desaturation to 70's, although still talking with no signs of distress. Unfortunately there was water in the tubing, preventing air flow. RN retrieved additional tubing while PT placed pt on NRB intermittently. Pt with return to 94% on 15L HFNC with appropriate equipment. Session focused on seated therapeutic exercises and mobility. Pt ambulating 10 feet with no assistive device at a supervision level. Desaturation to ~84% with ambulation, HR peak 116 bpm. Will continue to progress mobility as tolerated.     Follow Up Recommendations Home health PT    Equipment Recommendations  None recommended by PT    Recommendations for Other Services       Precautions / Restrictions Precautions Precautions: Fall Restrictions Weight Bearing Restrictions: No      Mobility  Bed Mobility               General bed mobility comments: in chair  Transfers Overall transfer level: Independent Equipment used: None                Ambulation/Gait Ambulation/Gait assistance: Supervision Gait Distance (Feet): 10 Feet Assistive device: None Gait Pattern/deviations: Step-through pattern;Decreased stride length;Wide base of support Gait velocity: decreased   General Gait Details: Slow, guarded gait, supervision for line management  Stairs             Wheelchair Mobility    Modified Rankin (Stroke Patients Only)       Balance Overall balance assessment: Needs assistance Sitting-balance support: Feet supported Sitting balance-Leahy Scale: Good     Standing balance support: No upper extremity supported;During functional activity Standing balance-Leahy Scale: Good                               Pertinent Vitals/Pain Pain Assessment: No/denies pain    Home Living Family/patient expects to be discharged to:: Private residence Living Arrangements: Spouse/significant other Available Help at Discharge: Family Type of Home: House Home Access: Stairs to enter Entrance Stairs-Rails: None Entrance Stairs-Number of Steps: 1+1 Home Layout: One level Home Equipment: Environmental consultant - 2 wheels;Bedside commode;Shower seat      Prior Function Level of Independence: Independent               Hand Dominance   Dominant Hand: Right    Extremity/Trunk Assessment   Upper Extremity Assessment Upper Extremity Assessment: Overall WFL for tasks assessed    Lower Extremity Assessment Lower Extremity Assessment: Overall WFL for tasks assessed    Cervical / Trunk Assessment Cervical / Trunk Assessment: Other exceptions Cervical / Trunk Exceptions: overweight  Communication   Communication: No difficulties  Cognition Arousal/Alertness: Awake/alert Behavior During Therapy: WFL for tasks assessed/performed Overall Cognitive Status: Within Functional Limits for tasks assessed  General Comments      Exercises General Exercises - Lower Extremity Long Arc Quad: Both;10 reps;Seated Hip Flexion/Marching: Both;10 reps;Seated Heel Raises: Both;10 reps;Seated   Assessment/Plan    PT Assessment Patient needs continued PT services  PT Problem List Decreased strength;Decreased activity tolerance;Decreased mobility;Decreased knowledge of use of DME;Cardiopulmonary status  limiting activity;Obesity       PT Treatment Interventions DME instruction;Gait training;Functional mobility training;Therapeutic activities;Therapeutic exercise;Balance training;Patient/family education    PT Goals (Current goals can be found in the Care Plan section)  Acute Rehab PT Goals Patient Stated Goal: not come back to the hospital PT Goal Formulation: With patient Time For Goal Achievement: 03/26/20 Potential to Achieve Goals: Good    Frequency Min 3X/week   Barriers to discharge        Co-evaluation               AM-PAC PT "6 Clicks" Mobility  Outcome Measure Help needed turning from your back to your side while in a flat bed without using bedrails?: None Help needed moving from lying on your back to sitting on the side of a flat bed without using bedrails?: None Help needed moving to and from a bed to a chair (including a wheelchair)?: None Help needed standing up from a chair using your arms (e.g., wheelchair or bedside chair)?: None Help needed to walk in hospital room?: None Help needed climbing 3-5 steps with a railing? : A Little 6 Click Score: 23    End of Session Equipment Utilized During Treatment: Oxygen Activity Tolerance: Patient tolerated treatment well Patient left: in chair;with call bell/phone within reach Nurse Communication: Mobility status PT Visit Diagnosis: Difficulty in walking, not elsewhere classified (R26.2);Muscle weakness (generalized) (M62.81)    Time: 1510-1540 PT Time Calculation (min) (ACUTE ONLY): 30 min   Charges:   PT Evaluation $PT Eval Moderate Complexity: 1 Mod PT Treatments $Therapeutic Exercise: 8-22 mins          Michael Wolf, PT, DPT Acute Rehabilitation Services Pager [phone] Office [phone]   Michael Wolf 03/12/2020, 5:06 PM

## 2020-03-13 LAB — CBC WITH DIFFERENTIAL/PLATELET
Abs Immature Granulocytes: 0.12 10*3/uL — ABNORMAL HIGH (ref 0.00–0.07)
Basophils Absolute: 0 10*3/uL (ref 0.0–0.1)
Basophils Relative: 0 %
Eosinophils Absolute: 0 10*3/uL (ref 0.0–0.5)
Eosinophils Relative: 0 %
HCT: 40.8 % (ref 39.0–52.0)
Hemoglobin: 13.1 g/dL (ref 13.0–17.0)
Immature Granulocytes: 1 %
Lymphocytes Relative: 2 %
Lymphs Abs: 0.3 10*3/uL — ABNORMAL LOW (ref 0.7–4.0)
MCH: 28.5 pg (ref 26.0–34.0)
MCHC: 32.1 g/dL (ref 30.0–36.0)
MCV: 88.9 fL (ref 80.0–100.0)
Monocytes Absolute: 0.8 10*3/uL (ref 0.1–1.0)
Monocytes Relative: 6 %
Neutro Abs: 12.7 10*3/uL — ABNORMAL HIGH (ref 1.7–7.7)
Neutrophils Relative %: 91 %
Platelets: 369 10*3/uL (ref 150–400)
RBC: 4.59 MIL/uL (ref 4.22–5.81)
RDW: 14.1 % (ref 11.5–15.5)
WBC: 13.9 10*3/uL — ABNORMAL HIGH (ref 4.0–10.5)
nRBC: 0 % (ref 0.0–0.2)

## 2020-03-13 LAB — COMPREHENSIVE METABOLIC PANEL
ALT: 62 U/L — ABNORMAL HIGH (ref 0–44)
AST: 47 U/L — ABNORMAL HIGH (ref 15–41)
Albumin: 3.2 g/dL — ABNORMAL LOW (ref 3.5–5.0)
Alkaline Phosphatase: 63 U/L (ref 38–126)
Anion gap: 12 (ref 5–15)
BUN: 29 mg/dL — ABNORMAL HIGH (ref 8–23)
CO2: 30 mmol/L (ref 22–32)
Calcium: 8.8 mg/dL — ABNORMAL LOW (ref 8.9–10.3)
Chloride: 98 mmol/L (ref 98–111)
Creatinine, Ser: 1.21 mg/dL (ref 0.61–1.24)
GFR calc Af Amer: >60 mL/min (ref >60)
GFR calc non Af Amer: >60 mL/min (ref >60)
Glucose, Bld: 148 mg/dL — ABNORMAL HIGH (ref 70–99)
Potassium: 3.9 mmol/L (ref 3.5–5.1)
Sodium: 140 mmol/L (ref 135–145)
Total Bilirubin: 0.5 mg/dL (ref 0.3–1.2)
Total Protein: 6 g/dL — ABNORMAL LOW (ref 6.5–8.1)

## 2020-03-13 LAB — GLUCOSE, CAPILLARY
Glucose-Capillary: 94 mg/dL (ref 70–99)
Glucose-Capillary: 99 mg/dL (ref 70–99)
Glucose-Capillary: 124 mg/dL — ABNORMAL HIGH (ref 70–99)
Glucose-Capillary: 145 mg/dL — ABNORMAL HIGH (ref 70–99)

## 2020-03-13 LAB — C-REACTIVE PROTEIN: CRP: 2.9 mg/dL — ABNORMAL HIGH (ref <1.0)

## 2020-03-13 LAB — D-DIMER, QUANTITATIVE: D-Dimer, Quant: 0.44 ug/mL-FEU (ref 0.00–0.50)

## 2020-03-13 MED ORDER — SALINE SPRAY 0.65 % NA SOLN
1.0000 | NASAL | Status: DC | PRN
  Administered 2020-03-13 – 2020-03-19 (×3): 1 via NASAL
  Filled 2020-03-13: qty 44

## 2020-03-13 MED ORDER — POLYETHYLENE GLYCOL 3350 17 G PO PACK
17.0000 g | PACK | Freq: Every day | ORAL | Status: DC | PRN
  Administered 2020-03-13: 17 g via ORAL
  Filled 2020-03-13: qty 1

## 2020-03-13 MED ORDER — SENNOSIDES-DOCUSATE SODIUM 8.6-50 MG PO TABS
2.0000 | ORAL_TABLET | Freq: Two times a day (BID) | ORAL | Status: DC
  Administered 2020-03-13 – 2020-03-24 (×21): 2 via ORAL
  Filled 2020-03-13 (×21): qty 2

## 2020-03-13 NOTE — Progress Notes (Signed)
PROGRESS NOTE                                                                                                                                                                                                             Patient Demographics:    Michael Wolf, is a 64 y.o. male, DOB - 06/10/1956, QHU:[id number]  Admit date - 02/26/2020   Admitting Physician Lequita Halt, MD  Outpatient Primary MD for the patient is [name], MD  LOS - 3   Chief Complaint  Patient presents with  . Shortness of Breath       Brief Narrative    Michael Wolf is a 64 y.o. male with medical history significant of HTN, HLD, IIDM, non-Hodgkin's lymphoma in remission, OSA on HS CPAP, morbid obesity and recent breakthrough COVID-19 PNA s/p baricitinib and remdesivir and was discharged home yesterday on 2-4 L of home O2. Pt reported that the O2 concentrator has the "air flow coming out" via nasal cannula, but unable to hook it to CPAP overnight, and this morning, pt felt more SOB and check his PulseOx at home showing 86%, he denied any cough, no wheezing, no chest pain, no fever or chills. He was noted to have Hypoxia O2 sat in 80s on arrival, then improved to 93-95% on 15 L. Chest Xray no significant changes compared to earlier this week.  WBC 9.5, Cre 1.24 compared to 0.98 2 days ago. procalcitonin <0.1, D-Dimer 0.74.    Subjective:    Michael Wolf today reports dyspnea has improved, he still reports some cough, nasal congestion as well .    Assessment  & Plan :    Active Problems:   COVID-19   Non Hodgkin's lymphoma ([place])   Obesity, Class III, BMI 40-49.9 (morbid obesity) ([place])   Hypoxia   Acute hypoxia respiratory failure due to COVID-19 pneumonia -Patient is vaccinated, but unfortunately appears to be having severe disease as evident on CT chest obtained. -He was discharged recently on 2 L nasal oxygen, with significant hypoxia on readmission  requiring 15 L NRB +15 L high flow nasal cannula.  He is gradually improving, this morning he is requiring 10 L high flow nasal cannula . -Was encouraged with incentive spirometry, flutter valve, out of bed to chair. -Resumed back on IV Solu-Medrol. -Resumed back on baricitinib. -Finished 5 days  on remdesivir, no indication to repeat -D-dimers within normal limit, no clinical suspicion for PE. -Continue with diuresis as needed, the Lasix appears to be helping, but creatinine is mildly elevated today, so I will hold on further diuresis for next 24 hours.  SpO2: 94 % O2 Flow Rate (L/min): 10 L/min FiO2 (%): 100 %    COVID-19 Labs  Recent Labs    02/21/2020 1613 03/02/2020 1613 03/11/20 0103 03/12/20 0320 03/13/20 0246  DDIMER 0.74*   < > 0.48 0.39 0.44  FERRITIN 751*  --   --   --   --   LDH 446*  --   --   --   --   CRP 4.8*   < > 8.3* 6.1* 2.9*   < > = values in this interval not displayed.    Lab Results  Component Value Date   [place] (A) 03/03/2020   Hypertension /borderline hypotension  -Patient with history of hypertension, continue to hold home medication -Received albumin  -On as needed diuresis -Blood pressure remains low, will start on low-dose Midodrin.  HLD -On Statin and Ezetimibe  Non-Hodgkin's Lymphoma -Remission since 2018  OSA -As above, he is with significant oxygen requirement, one be able to straight CPAP currently .  Morbid Obesity -Weight loss recommended  Type 2 diabetes mellitus -Hold Metformin, continue with insulin sliding scale.   Code Status : Full  Family Communication  : D/W wife by phone daily.  Disposition Plan  :  Status is: Inpatient  Remains inpatient appropriate because:Hemodynamically unstable and IV treatments appropriate due to intensity of illness or inability to take PO   Dispo: The patient is from: Home              Anticipated d/c is to: Home              Anticipated d/c date is: > 3 days               Patient currently is not medically stable to d/c.       Consults  :  none  Procedures  : none  DVT Prophylaxis  :  [place] lovenox  Lab Results  Component Value Date   PLT 369 03/13/2020    Antibiotics  :    Anti-infectives (From admission, onward)   None        Objective:   Vitals:   03/12/20 2000 03/13/20 0444 03/13/20 0911 03/13/20 1207  BP: 122/66 101/70 113/72 106/62  Pulse: 94 96 98 98  Resp: 19 20 (!) 21 18  Temp: 98.4 F (36.9 C) 97.8 F (36.6 C) 99.1 F (37.3 C) 98.7 F (37.1 C)  TempSrc: Oral Oral Axillary Oral  SpO2: 94% 99% 93% 94%  Weight:      Height:        Wt Readings from Last 3 Encounters:  03/09/2020 (!) 138.7 kg  03/03/20 136.1 kg  12/01/19 (!) 149.5 kg     Intake/Output Summary (Last 24 hours) at 03/13/2020 1444 Last data filed at 03/13/2020 1150 Gross per 24 hour  Intake 720 ml  Output 1550 ml  Net -830 ml     Physical Exam  Awake Alert, Oriented X 3, No new F.N deficits, Normal affect Symmetrical Chest wall movement, Good air movement bilaterally, CTAB RRR,No Gallops,Rubs or new Murmurs, No Parasternal Heave +ve B.Sounds, Abd Soft, No tenderness, No rebound - guarding or rigidity. No Cyanosis, Clubbing ,Trace edema, No new Rash or bruise  Data Review:    CBC Recent Labs  Lab 03/08/20 0934 02/25/2020 1613 03/11/20 0103 03/12/20 0320 03/13/20 0246  WBC 9.6 9.5 8.5 9.7 13.9*  HGB 13.7 13.6 13.2 12.8* 13.1  HCT 42.4 42.6 39.9 39.7 40.8  PLT 375 360 324 357 369  MCV 90.8 90.6 89.5 89.6 88.9  MCH 29.3 28.9 29.6 28.9 28.5  MCHC 32.3 31.9 33.1 32.2 32.1  RDW 14.3 14.5 14.5 14.3 14.1  LYMPHSABS 0.1* 0.1* 0.1* 0.2* 0.3*  MONOABS 0.3 0.7 0.4 0.3 0.8  EOSABS 0.0 0.0 0.0 0.0 0.0  BASOSABS 0.0 0.0 0.0 0.0 0.0    Chemistries  Recent Labs  Lab 03/07/20 1014 03/07/20 1014 03/08/20 0934 02/27/2020 1613 03/11/20 0103 03/12/20 0320 03/13/20 0246  NA 141   < > 139 136 138 138 140  K 4.4   < > 4.1 4.1 4.2  4.1 3.9  CL 101   < > 100 102 101 98 98  CO2 29   < > 25 23 25 29 30   GLUCOSE 212*   < > 252* 132* 137* 194* 148*  BUN 21   < > 21 21 19  24* 29*  CREATININE 0.95   < > 0.98 1.25* 0.92 1.11 1.21  CALCIUM 8.2*   < > 8.1* 8.0* 8.4* 8.8* 8.8*  MG 2.3  --  2.2  --  2.4  --   --   AST 51*   < > 43* 39 32 39 47*  ALT 74*   < > 70* 49* 43 45* 62*  ALKPHOS 54   < > 62 61 55 53 63  BILITOT 0.6   < > 1.0 0.6 1.0 1.1 0.5   < > = values in this interval not displayed.   ------------------------------------------------------------------------------------------------------------------ Recent Labs    03/05/2020 1613  TRIG 122    Lab Results  Component Value Date   HGBA1C 6.9 (H) 03/08/2020   ------------------------------------------------------------------------------------------------------------------ No results for input(s): TSH, T4TOTAL, T3FREE, THYROIDAB in the last 72 hours.  Invalid input(s): FREET3 ------------------------------------------------------------------------------------------------------------------ Recent Labs    02/25/2020 1613  FERRITIN 751*    Coagulation profile No results for input(s): INR, PROTIME in the last 168 hours.  Recent Labs    03/12/20 0320 03/13/20 0246  DDIMER 0.39 0.44    Cardiac Enzymes No results for input(s): CKMB, TROPONINI, MYOGLOBIN in the last 168 hours.  Invalid input(s): CK ------------------------------------------------------------------------------------------------------------------    Component Value Date/Time   BNP 57.6 03/11/2020 0103    Inpatient Medications  Scheduled Meds: . baricitinib  4 mg Oral Daily  . enoxaparin (LOVENOX) injection  70 mg Subcutaneous Q24H  . ezetimibe  10 mg Oral Daily  . gabapentin  300 mg Oral Q12H  . insulin aspart  0-15 Units Subcutaneous TID WC  . loratadine  10 mg Oral Daily  . methylPREDNISolone (SOLU-MEDROL) injection  60 mg Intravenous Q8H  . pantoprazole  40 mg Oral Daily  .  rosuvastatin  40 mg Oral Daily  . venlafaxine XR  75 mg Oral Q breakfast   Continuous Infusions:  PRN Meds:.clonazePAM  Micro Results Recent Results (from the past 240 hour(s))  SARS Coronavirus 2 by RT PCR (hospital order, performed in May hospital lab) Nasopharyngeal     Status: Abnormal   Collection Time: 03/03/20  3:26 PM   Specimen: Nasopharyngeal  Result Value Ref Range Status   SARS Coronavirus 2 POSITIVE (A) NEGATIVE Final    Comment: RESULT CALLED TO, READ BACK BY AND VERIFIED WITH: MARVA SIMMS  RN @1656  03/03/2020 OLSONM (NOTE) SARS-CoV-2 target nucleic acids are DETECTED  SARS-CoV-2 RNA is generally detectable in upper respiratory specimens  during the acute phase of infection.  Positive results are indicative  of the presence of the identified virus, but do not rule out bacterial infection or co-infection with other pathogens not detected by the test.  Clinical correlation with patient history and  other diagnostic information is necessary to determine patient infection status.  The expected result is negative.  Fact Sheet for Patients:   StrictlyIdeas.no   Fact Sheet for Healthcare Providers:   BankingDealers.co.za    This test is not yet approved or cleared by the [place] FDA and  has been authorized for detection and/or diagnosis of SARS-CoV-2 by FDA under an Emergency Use Authorization (EUA).  This EUA will remain in effect (meaning thi s test can be used) for the duration of  the COVID-19 declaration under Section 564(b)(1) of the Act, 21 U.S.C. section 360-bbb-3(b)(1), unless the authorization is terminated or revoked sooner.  Performed at [facility], [address] [place], [place] [address]   Blood Culture (routine x 2)     Status: None   Collection Time: 03/03/20  3:27 PM   Specimen: BLOOD LEFT ARM  Result Value Ref Range Status   Specimen Description   Final    BLOOD LEFT  ARM Performed at [facility] [address] [place], [place] [address]    Special Requests   Final    BOTTLES DRAWN AEROBIC AND ANAEROBIC Blood Culture adequate volume Performed at [facility], [address] [place], [place] [address]    Culture   Final    NO GROWTH 5 DAYS Performed at [facility], [place] [address], [place], [place] [address]    Report Status 03/08/2020 FINAL  Final  Blood Culture (routine x 2)     Status: None   Collection Time: 03/03/20  3:40 PM   Specimen: BLOOD LEFT HAND  Result Value Ref Range Status   Specimen Description   Final    BLOOD LEFT HAND Performed at [facility], [address] [place], [place] [address]    Special Requests   Final    BOTTLES DRAWN AEROBIC AND ANAEROBIC Blood Culture adequate volume Performed at [facility], [address] [place], [place] [address]    Culture   Final    NO GROWTH 5 DAYS Performed at [facility], [place] [address] [place], [place] [address]    Report Status 03/08/2020 FINAL  Final  Blood Culture (routine x 2)     Status: None (Preliminary result)   Collection Time: 02/16/2020  4:12 PM   Specimen: BLOOD  Result Value Ref Range Status   Specimen Description BLOOD RIGHT ANTECUBITAL  Final   Special Requests   Final    BOTTLES DRAWN AEROBIC AND ANAEROBIC Blood Culture results may not be optimal due to an inadequate volume of blood received in culture bottles   Culture   Final    NO GROWTH 3 DAYS Performed at [facility], [place] [address] [place], [place] [address]    Report Status PENDING  Incomplete  Blood Culture (routine x 2)     Status: None (Preliminary result)   Collection Time: 02/24/2020  8:12 PM   Specimen: BLOOD LEFT HAND  Result Value Ref Range Status   Specimen Description BLOOD LEFT HAND  Final   Special Requests   Final    BOTTLES DRAWN AEROBIC AND ANAEROBIC Blood Culture adequate volume   Culture   Final    NO GROWTH 3  DAYS Performed at [facility], [address], [place], [place] [address]    Report Status PENDING  Incomplete    Radiology Reports CT CHEST WO CONTRAST  Result Date: 03/11/2020 CLINICAL DATA:  Pneumonia, unresolved. History of non-Hodgkin's lymphoma. Positive for coronavirus. EXAM: CT CHEST WITHOUT CONTRAST TECHNIQUE: Multidetector CT imaging of the chest was performed following the standard protocol without IV contrast. COMPARISON:  Chest radiograph 02/25/2020 FINDINGS: Cardiovascular: Normal heart size. No pericardial effusion. Aortic atherosclerosis. Coronary artery calcifications. Mediastinum/Nodes: No enlarged mediastinal or axillary lymph nodes. Thyroid gland, trachea, and esophagus demonstrate no significant findings. Lungs/Pleura: There is no pleural effusion. Extensive, bilateral pulmonary ground-glass opacification with patchy areas of nodular consolidation compatible with atypical viral pneumonia. There is a subpleural bleb identified within the posterior left lung base measuring 5.6 cm, image 145/6. Upper Abdomen: Multiple left lobe of liver cyst noted measuring up to 2.8 cm. No pain acute finding noted within the imaged portions of the upper abdomen. Musculoskeletal: No chest wall mass or suspicious bone lesions identified. IMPRESSION: 1. Extensive, bilateral pulmonary ground-glass opacification with patchy areas of nodular consolidation compatible with atypical viral pneumonia. 2. Coronary artery calcifications noted. 3. Aortic atherosclerosis. Aortic Atherosclerosis (ICD10-I70.0). Electronically Signed   By: Kerby Moors M.D.   On: 03/11/2020 11:37   DG Chest Port 1 View  Result Date: 02/24/2020 CLINICAL DATA:  Short of breath EXAM: PORTABLE CHEST 1 VIEW COMPARISON:  03/05/2020 FINDINGS: Progression of right lower lobe airspace disease. Cardiac silhouette upper normal. Negative for heart failure. Left lung clear. Probable underlying COPD IMPRESSION: COPD.  Progressive right  lower lobe infiltrate. Electronically Signed   By: Franchot Gallo M.D.   On: 02/15/2020 16:17   DG Chest Port 1 View  Result Date: 03/05/2020 CLINICAL DATA:  COVID-19. EXAM: PORTABLE CHEST 1 VIEW COMPARISON:  March 03, 2020 FINDINGS: New hazy infiltrates are seen in the lungs, particularly the upper lobes. The cardiomediastinal silhouette is stable. No pneumothorax. No other acute abnormalities. IMPRESSION: New hazy infiltrates, particularly in the upper lobes, worrisome for developing COVID-19 pneumonia given history. Electronically Signed   By: Dorise Bullion III M.D   On: 03/05/2020 08:38   DG Chest Port 1 View  Result Date: 03/03/2020 CLINICAL DATA:  COVID-19 diagnosed 2 weeks ago, increasing shortness of breath, hypoxemia EXAM: PORTABLE CHEST 1 VIEW COMPARISON:  None. FINDINGS: Single frontal view of the chest demonstrates an unremarkable cardiac silhouette. Diffuse interstitial prominence is seen, with faint ground-glass opacities within the mid and lower lung zones. No effusion or pneumothorax. No acute bony abnormality. IMPRESSION: 1. Interstitial prominence with bilateral ground-glass airspace disease. Differential includes multifocal atypical pneumonia versus edema. Electronically Signed   By: Randa Ngo M.D.   On: 03/03/2020 15:43   ECHOCARDIOGRAM LIMITED  Result Date: 03/11/2020    ECHOCARDIOGRAM LIMITED REPORT   Patient Name:   DALEY GOSSE Date of Exam: 03/11/2020 Medical Rec #:  [id number]       Height:       72.0 in Accession #:    [id number]      Weight:       305.7 lb Date of Birth:  May 07, 1956       BSA:          2.552 m Patient Age:    1 years  BP:           122/68 mmHg Patient Gender: M               HR:           85 bpm. Exam Location:  Inpatient Procedure: Limited Echo, Color Doppler and Cardiac Doppler Indications:   pulmonary hypertension 416.8  History:       Patient has no prior history of Echocardiogram examinations.                Covid.  Sonographer:   Johny Chess Referring      [id number] Lequita Halt Phys: IMPRESSIONS  1. Left ventricular ejection fraction, by estimation, is 55 to 60%. The left ventricle has normal function. The left ventricle has no regional wall motion abnormalities. Left ventricular diastolic parameters were normal.  2. Right ventricular systolic function is normal. The right ventricular size is mildly enlarged. Tricuspid regurgitation signal is inadequate for assessing PA pressure.  3. The aortic valve is tricuspid. Aortic valve regurgitation is not visualized. Mild aortic valve sclerosis is present, with no evidence of aortic valve stenosis.  4. The inferior vena cava is normal in size with greater than 50% respiratory variability, suggesting right atrial pressure of 3 mmHg. FINDINGS  Left Ventricle: Left ventricular ejection fraction, by estimation, is 55 to 60%. The left ventricle has normal function. The left ventricle has no regional wall motion abnormalities. The left ventricular internal cavity size was normal in size. There is  borderline left ventricular hypertrophy. Right Ventricle: The right ventricular size is mildly enlarged. No increase in right ventricular wall thickness. Right ventricular systolic function is normal. Tricuspid regurgitation signal is inadequate for assessing PA pressure. Pericardium: There is no evidence of pericardial effusion. Presence of pericardial fat pad. Tricuspid Valve: The tricuspid valve is grossly normal. Tricuspid valve regurgitation is trivial. Aortic Valve: The aortic valve is tricuspid. Aortic valve regurgitation is not visualized. Mild aortic valve sclerosis is present, with no evidence of aortic valve stenosis. Mild aortic valve annular calcification. Pulmonic Valve: The pulmonic valve was grossly normal. Pulmonic valve regurgitation is trivial. Venous: The inferior vena cava is normal in size with greater than 50% respiratory variability, suggesting right atrial pressure of 3 mmHg. IAS/Shunts:  No atrial level shunt detected by color flow Doppler. LEFT VENTRICLE PLAX 2D LVIDd:         5.00 cm  Diastology LVIDs:         3.50 cm  LV e' lateral:   13.90 cm/s LV PW:         1.10 cm  LV E/e' lateral: 5.1 LV IVS:        1.00 cm  LV e' medial:    12.60 cm/s LVOT diam:     2.30 cm  LV E/e' medial:  5.6 LV SV:         85 LV SV Index:   33 LVOT Area:     4.15 cm  IVC IVC diam: 1.90 cm LEFT ATRIUM         Index LA diam:    4.00 cm 1.57 cm/m  AORTIC VALVE LVOT Vmax:   106.00 cm/s LVOT Vmean:  68.700 cm/s LVOT VTI:    0.205 m MITRAL VALVE MV Area (PHT): 4.60 cm    SHUNTS MV Decel Time: 165 msec    Systemic VTI:  0.20 m MV E velocity: 70.30 cm/s  Systemic Diam: 2.30 cm MV A velocity: 90.80 cm/s MV E/A ratio:  0.77 Rozann Lesches MD Electronically signed by Rozann Lesches MD Signature Date/Time: 03/11/2020/4:40:56 PM    Final      Phillips Climes M.D on 03/13/2020 at 2:44 PM    [facility] -  Office  [phone]

## 2020-03-13 NOTE — TOC Initial Note (Signed)
Transition of Care [facility]) - Initial/Assessment Note    Patient Details  Name: Michael Wolf MRN: [id number] Date of Birth: [birth year]  Transition of Care [facility]) CM/SW Contact:    Michael Carmine, RN Phone Number: 03/13/2020, 10:56 AM  Clinical Narrative:                 Patient readmitted after only one day for increasing hypoxia requiring high flow oxygen  NRB and 15 LPM high flow He was set up with home oxygen, Home health with [facility] who had not had a chance to visit yet,.  They will be seeing him on his discharge  [employer]  Is the company for the oxygen.  Will touch base with Michael Wolf  Blank to discuss the connectors for CPAP.   Expected Discharge Plan: Michael Wolf to Discharge: Continued Medical Work up   Patient Goals and CMS Choice   CMS Medicare.gov Compare Post Acute Care list provided to:: Patient Represenative (must comment) (Spouse)    Expected Discharge Plan and Services Expected Discharge Plan: [place]   Discharge Planning Services: CM Consult   Living arrangements for the past 2 months: Single Family Home                                   Representative spoke with at [place]: Still active with [facility] patient was readmitted before they could see him  Prior Living Arrangements/Services Living arrangements for the past 2 months: Single Family Home Lives with:: Spouse Patient language and need for interpreter reviewed:: Yes        Need for Family Participation in Patient Care: Yes (Comment) Care giver support system in place?: Yes (comment)   Criminal Activity/Legal Involvement Pertinent to Current Situation/Hospitalization: No - Comment as needed  Activities of Daily Living Home Assistive Devices/Equipment: CPAP ADL Screening (condition at time of admission) Patient's cognitive ability adequate to safely complete daily activities?: Yes Is the patient deaf or have difficulty hearing?: No Does the patient  have difficulty seeing, even when wearing glasses/contacts?: No Does the patient have difficulty concentrating, remembering, or making decisions?: No Patient able to express need for assistance with ADLs?: Yes Does the patient have difficulty dressing or bathing?: No Independently performs ADLs?: Yes (appropriate for developmental age) Does the patient have difficulty walking or climbing stairs?: No Weakness of Legs: None Weakness of Arms/Hands: None  Permission Sought/Granted      Share Information with NAME: Spouse Michael Wolf           Emotional Assessment       Orientation: : Fluctuating Orientation (Suspected and/or reported Sundowners) Alcohol / Substance Use: Not Applicable Psych Involvement: No (comment)  Admission diagnosis:  Hypoxia [R09.02] Acute respiratory failure with hypoxia ([place]) [J96.01] COVID-19 [U07.1] Patient Active Problem List   Diagnosis Date Noted  . Hypoxia 03/14/2020  . Transient hypotension 03/04/2020  . Hypokalemia 03/04/2020  . Hyponatremia 03/04/2020  . Obesity, Class III, BMI 40-49.9 (morbid obesity) ([place]) 03/04/2020  . Non Hodgkin's lymphoma ([place])   . Acute respiratory failure ([place]) 03/03/2020  . COVID-19 03/03/2020   PCP:  Michael Petty, MD Pharmacy:   [facility] [place] [place], [place] [address] Phone: [phone] Fax: [phone]     Social Determinants of Health (SDOH) Interventions    Readmission Risk Interventions No flowsheet data  found.  

## 2020-03-13 NOTE — Progress Notes (Signed)
Physical Therapy Treatment Patient Details Name: Michael Wolf MRN: [id number] DOB: May 23, 1956 Today's Date: 03/13/2020    History of Present Illness Michael Wolf is a 63 y.o. male with medical history significant of HTN, HLD, IIDM, non-Hodgkin's lymphoma in remission, OSA on HS CPAP, morbid obesity and recent breakthrough COVID-19 PNA s/p baricitinib and remdesivir and was discharged home 8/26 on 2-4 L of home O2. Pt with readmission with significant hypoxia requiring 15 L NRB + 15L HFNC.     PT Comments    Pt with slowly improving activity tolerance. Remains very motivated to return to independence.    Follow Up Recommendations  Home health PT     Equipment Recommendations  None recommended by PT    Recommendations for Other Services       Precautions / Restrictions Precautions Precautions: Other (comment) Precaution Comments: watch SpO2    Mobility  Bed Mobility               General bed mobility comments: in chair  Transfers Overall transfer level: Independent Equipment used: None                Ambulation/Gait Ambulation/Gait assistance: Supervision Gait Distance (Feet): 60 Feet (60' x 1, 40' x 1) Assistive device: None Gait Pattern/deviations: Step-through pattern;Decreased stride length Gait velocity: decreased Gait velocity interpretation: 1.31 - 2.62 ft/sec, indicative of limited community ambulator General Gait Details: supervision for safety, lines and to monitor vitals with activity   Stairs             Wheelchair Mobility    Modified Rankin (Stroke Patients Only)       Balance Overall balance assessment: Needs assistance Sitting-balance support: Feet supported Sitting balance-Leahy Scale: Good     Standing balance support: No upper extremity supported;During functional activity Standing balance-Leahy Scale: Good                              Cognition Arousal/Alertness: Awake/alert Behavior During  Therapy: WFL for tasks assessed/performed Overall Cognitive Status: Within Functional Limits for tasks assessed                                        Exercises      General Comments General comments (skin integrity, edema, etc.): Pt on 10L of O2 with SpO2 to 83% with amb. Returns to 90% after 2-3 minutes seated rest      Pertinent Vitals/Pain Pain Assessment: No/denies pain    Home Living                      Prior Function            PT Goals (current goals can now be found in the care plan section) Acute Rehab PT Goals Patient Stated Goal: not come back to the hospital Progress towards PT goals: Progressing toward goals    Frequency    Min 3X/week      PT Plan Current plan remains appropriate    Co-evaluation              AM-PAC PT "6 Clicks" Mobility   Outcome Measure  Help needed turning from your back to your side while in a flat bed without using bedrails?: None Help needed moving from lying on your back to sitting on the side of a flat bed without using  bedrails?: None Help needed moving to and from a bed to a chair (including a wheelchair)?: None Help needed standing up from a chair using your arms (e.g., wheelchair or bedside chair)?: None Help needed to walk in hospital room?: A Little Help needed climbing 3-5 steps with a railing? : A Little 6 Click Score: 22    End of Session Equipment Utilized During Treatment: Oxygen Activity Tolerance: Patient tolerated treatment well Patient left: in chair;with call bell/phone within reach   PT Visit Diagnosis: Difficulty in walking, not elsewhere classified (R26.2);Muscle weakness (generalized) (M62.81)     Time: 7510-2585 PT Time Calculation (min) (ACUTE ONLY): 15 min  Charges:  $Gait Training: 8-22 mins                     [place] Pager [phone] Office [place] 03/13/2020, 4:09 PM

## 2020-03-14 LAB — COMPREHENSIVE METABOLIC PANEL
ALT: 71 U/L — ABNORMAL HIGH (ref 0–44)
AST: 40 U/L (ref 15–41)
Albumin: 2.9 g/dL — ABNORMAL LOW (ref 3.5–5.0)
Alkaline Phosphatase: 60 U/L (ref 38–126)
Anion gap: 11 (ref 5–15)
BUN: 25 mg/dL — ABNORMAL HIGH (ref 8–23)
CO2: 29 mmol/L (ref 22–32)
Calcium: 8.5 mg/dL — ABNORMAL LOW (ref 8.9–10.3)
Chloride: 98 mmol/L (ref 98–111)
Creatinine, Ser: 1 mg/dL (ref 0.61–1.24)
GFR calc Af Amer: >60 mL/min (ref >60)
GFR calc non Af Amer: >60 mL/min (ref >60)
Glucose, Bld: 156 mg/dL — ABNORMAL HIGH (ref 70–99)
Potassium: 4.3 mmol/L (ref 3.5–5.1)
Sodium: 138 mmol/L (ref 135–145)
Total Bilirubin: 0.8 mg/dL (ref 0.3–1.2)
Total Protein: 5.5 g/dL — ABNORMAL LOW (ref 6.5–8.1)

## 2020-03-14 LAB — CBC WITH DIFFERENTIAL/PLATELET
Abs Immature Granulocytes: 0.1 10*3/uL — ABNORMAL HIGH (ref 0.00–0.07)
Basophils Absolute: 0 10*3/uL (ref 0.0–0.1)
Basophils Relative: 0 %
Eosinophils Absolute: 0 10*3/uL (ref 0.0–0.5)
Eosinophils Relative: 0 %
HCT: 40.3 % (ref 39.0–52.0)
Hemoglobin: 12.9 g/dL — ABNORMAL LOW (ref 13.0–17.0)
Immature Granulocytes: 1 %
Lymphocytes Relative: 1 %
Lymphs Abs: 0.2 10*3/uL — ABNORMAL LOW (ref 0.7–4.0)
MCH: 28.5 pg (ref 26.0–34.0)
MCHC: 32 g/dL (ref 30.0–36.0)
MCV: 89 fL (ref 80.0–100.0)
Monocytes Absolute: 0.4 10*3/uL (ref 0.1–1.0)
Monocytes Relative: 4 %
Neutro Abs: 10.6 10*3/uL — ABNORMAL HIGH (ref 1.7–7.7)
Neutrophils Relative %: 94 %
Platelets: 331 10*3/uL (ref 150–400)
RBC: 4.53 MIL/uL (ref 4.22–5.81)
RDW: 13.8 % (ref 11.5–15.5)
WBC: 11.2 10*3/uL — ABNORMAL HIGH (ref 4.0–10.5)
nRBC: 0 % (ref 0.0–0.2)

## 2020-03-14 LAB — GLUCOSE, CAPILLARY
Glucose-Capillary: 127 mg/dL — ABNORMAL HIGH (ref 70–99)
Glucose-Capillary: 140 mg/dL — ABNORMAL HIGH (ref 70–99)
Glucose-Capillary: 163 mg/dL — ABNORMAL HIGH (ref 70–99)
Glucose-Capillary: 158 mg/dL — ABNORMAL HIGH (ref 70–99)

## 2020-03-14 LAB — BRAIN NATRIURETIC PEPTIDE: B Natriuretic Peptide: 39.4 pg/mL (ref 0.0–100.0)

## 2020-03-14 LAB — D-DIMER, QUANTITATIVE: D-Dimer, Quant: 0.73 ug/mL-FEU — ABNORMAL HIGH (ref 0.00–0.50)

## 2020-03-14 LAB — C-REACTIVE PROTEIN: CRP: 1.6 mg/dL — ABNORMAL HIGH (ref <1.0)

## 2020-03-14 MED ORDER — FUROSEMIDE 10 MG/ML IJ SOLN
40.0000 mg | Freq: Once | INTRAMUSCULAR | Status: AC
  Administered 2020-03-14: 40 mg via INTRAVENOUS
  Filled 2020-03-14: qty 4

## 2020-03-14 MED ORDER — FUROSEMIDE 10 MG/ML IJ SOLN
40.0000 mg | Freq: Every day | INTRAMUSCULAR | Status: DC
  Administered 2020-03-15 – 2020-03-16 (×2): 40 mg via INTRAVENOUS
  Filled 2020-03-14 (×2): qty 4

## 2020-03-14 NOTE — Progress Notes (Signed)
PROGRESS NOTE                                                                                                                                                                                                             Patient Demographics:    Michael Wolf, is a 65 y.o. male, DOB - 12-Aug-1955, WJX:[id number]  Admit date - 02/29/2020   Admitting Physician Lequita Halt, MD  Outpatient Primary MD for the patient is [name], MD  LOS - 4   Chief Complaint  Patient presents with  . Shortness of Breath       Brief Narrative    Michael Wolf is a 64 y.o. male with medical history significant of HTN, HLD, IIDM, non-Hodgkin's lymphoma in remission, OSA on HS CPAP, morbid obesity and recent breakthrough COVID-19 PNA s/p baricitinib and remdesivir and was discharged home yesterday on 2-4 L of home O2. Pt reported that the O2 concentrator has the "air flow coming out" via nasal cannula, but unable to hook it to CPAP overnight, and this morning, pt felt more SOB and check his PulseOx at home showing 86%, he denied any cough, no wheezing, no chest pain, no fever or chills. He was noted to have Hypoxia O2 sat in 80s on arrival, then improved to 93-95% on 15 L. Chest Xray no significant changes compared to earlier this week.  WBC 9.5, Cre 1.24 compared to 0.98 2 days ago. procalcitonin <0.1, D-Dimer 0.74.    Subjective:    Michael Wolf today reports dyspnea has improved, he still reports some cough, nasal congestion as well .    Assessment  & Plan :    Active Problems:   COVID-19   Non Hodgkin's lymphoma ([place])   Obesity, Class III, BMI 40-49.9 (morbid obesity) ([place])   Hypoxia   Acute hypoxia respiratory failure due to COVID-19 pneumonia -Patient is vaccinated, but unfortunately appears to be having severe disease as evident on CT chest obtained. -He was discharged recently on 2 L nasal oxygen, with significant hypoxia on readmission  requiring 15 L NRB +15 L high flow nasal cannula.  He is gradually improving, this morning he is requiring 10 L high flow nasal cannula . -Was encouraged with incentive spirometry, flutter valve, out of bed to chair. -Resumed back on IV Solu-Medrol. -Resumed back on baricitinib. -Finished 5 days  on remdesivir, no indication to repeat -D-dimers within normal limit, no clinical suspicion for PE. -He is with trace edema, he is on Lasix as needed, he has been  requiring it  every day, so I will start on Lasix 40 mg IV daily . continue with IV Lasix as needed.  SpO2: (!) 86 % O2 Flow Rate (L/min): 10 L/min FiO2 (%): 100 %    COVID-19 Labs  Recent Labs    03/12/20 0320 03/13/20 0246 03/14/20 0319 03/14/20 0703  DDIMER 0.39 0.44  --  0.73*  CRP 6.1* 2.9* 1.6*  --     Lab Results  Component Value Date   SARSCOV2NAA POSITIVE (A) 03/03/2020   Hypertension /borderline hypotension  -Patient with history of hypertension, continue to hold home medication -Received albumin  -On as needed diuresis  HLD -On Statin and Ezetimibe  Non-Hodgkin's Lymphoma -Remission since 2018  OSA -As above, he is with significant oxygen requirement, unable to tolerate CPAP currently .  Morbid Obesity -Weight loss recommended  Type 2 diabetes mellitus -Hold Metformin, continue with insulin sliding scale.   Code Status : Full  Family Communication  : D/W wife by phone daily.  Disposition Plan  :  Status is: Inpatient  Remains inpatient appropriate because:Hemodynamically unstable and IV treatments appropriate due to intensity of illness or inability to take PO   Dispo: The patient is from: Home              Anticipated d/c is to: Home              Anticipated d/c date is: > 3 days              Patient currently is not medically stable to d/c.       Consults  :  none  Procedures  : none  DVT Prophylaxis  :  Gallant lovenox  Lab Results  Component Value Date   PLT 331  03/14/2020    Antibiotics  :    Anti-infectives (From admission, onward)   None        Objective:   Vitals:   03/13/20 1643 03/13/20 2005 03/14/20 0416 03/14/20 1448  BP: (!) 101/57 136/70 120/70 104/61  Pulse: 89 89 91 95  Resp: 20 19 19 20   Temp: 98.9 F (37.2 C) 98.3 F (36.8 C) 97.9 F (36.6 C) 98.1 F (36.7 C)  TempSrc: Oral Oral Oral Oral  SpO2: 93% 92% 92% (!) 86%  Weight:      Height:        Wt Readings from Last 3 Encounters:  03/02/2020 (!) 138.7 kg  03/03/20 136.1 kg  12/01/19 (!) 149.5 kg     Intake/Output Summary (Last 24 hours) at 03/14/2020 1521 Last data filed at 03/14/2020 1449 Gross per 24 hour  Intake 720 ml  Output 2200 ml  Net -1480 ml     Physical Exam  Awake Alert, Oriented X 3, No new F.N deficits, Normal affect Symmetrical Chest wall movement, Good air movement bilaterally, CTAB RRR,No Gallops,Rubs or new Murmurs, No Parasternal Heave +ve B.Sounds, Abd Soft, No tenderness, No rebound - guarding or rigidity. No Cyanosis, Clubbing ,TRACE edema, No new Rash or bruise         Data Review:    CBC Recent Labs  Lab 02/14/2020 1613 03/11/20 0103 03/12/20 0320 03/13/20 0246 03/14/20 0319  WBC 9.5 8.5 9.7 13.9* 11.2*  HGB 13.6 13.2 12.8* 13.1 12.9*  HCT 42.6 39.9 39.7 40.8 40.3  PLT 360  324 357 369 331  MCV 90.6 89.5 89.6 88.9 89.0  MCH 28.9 29.6 28.9 28.5 28.5  MCHC 31.9 33.1 32.2 32.1 32.0  RDW 14.5 14.5 14.3 14.1 13.8  LYMPHSABS 0.1* 0.1* 0.2* 0.3* 0.2*  MONOABS 0.7 0.4 0.3 0.8 0.4  EOSABS 0.0 0.0 0.0 0.0 0.0  BASOSABS 0.0 0.0 0.0 0.0 0.0    Chemistries  Recent Labs  Lab 03/08/20 0934 03/08/20 0934 02/15/2020 1613 03/11/20 0103 03/12/20 0320 03/13/20 0246 03/14/20 0319  NA 139   < > 136 138 138 140 138  K 4.1   < > 4.1 4.2 4.1 3.9 4.3  CL 100   < > 102 101 98 98 98  CO2 25   < > 23 25 29 30 29   GLUCOSE 252*   < > 132* 137* 194* 148* 156*  BUN 21   < > 21 19 24* 29* 25*  CREATININE 0.98   < > 1.25* 0.92 1.11  1.21 1.00  CALCIUM 8.1*   < > 8.0* 8.4* 8.8* 8.8* 8.5*  MG 2.2  --   --  2.4  --   --   --   AST 43*   < > 39 32 39 47* 40  ALT 70*   < > 49* 43 45* 62* 71*  ALKPHOS 62   < > 61 55 53 63 60  BILITOT 1.0   < > 0.6 1.0 1.1 0.5 0.8   < > = values in this interval not displayed.   ------------------------------------------------------------------------------------------------------------------ No results for input(s): CHOL, HDL, LDLCALC, TRIG, CHOLHDL, LDLDIRECT in the last 72 hours.  Lab Results  Component Value Date   HGBA1C 6.9 (H) 03/08/2020   ------------------------------------------------------------------------------------------------------------------ No results for input(s): TSH, T4TOTAL, T3FREE, THYROIDAB in the last 72 hours.  Invalid input(s): FREET3 ------------------------------------------------------------------------------------------------------------------ No results for input(s): VITAMINB12, FOLATE, FERRITIN, TIBC, IRON, RETICCTPCT in the last 72 hours.  Coagulation profile No results for input(s): INR, PROTIME in the last 168 hours.  Recent Labs    03/13/20 0246 03/14/20 0703  DDIMER 0.44 0.73*    Cardiac Enzymes No results for input(s): CKMB, TROPONINI, MYOGLOBIN in the last 168 hours.  Invalid input(s): CK ------------------------------------------------------------------------------------------------------------------    Component Value Date/Time   BNP 39.4 03/14/2020 0319    Inpatient Medications  Scheduled Meds: . baricitinib  4 mg Oral Daily  . enoxaparin (LOVENOX) injection  70 mg Subcutaneous Q24H  . ezetimibe  10 mg Oral Daily  . gabapentin  300 mg Oral Q12H  . insulin aspart  0-15 Units Subcutaneous TID WC  . loratadine  10 mg Oral Daily  . methylPREDNISolone (SOLU-MEDROL) injection  60 mg Intravenous Q8H  . pantoprazole  40 mg Oral Daily  . rosuvastatin  40 mg Oral Daily  . senna-docusate  2 tablet Oral BID  . venlafaxine XR  75 mg  Oral Q breakfast   Continuous Infusions:  PRN Meds:.clonazePAM, polyethylene glycol, sodium chloride  Micro Results Recent Results (from the past 240 hour(s))  Blood Culture (routine x 2)     Status: None (Preliminary result)   Collection Time: 02/14/2020  4:12 PM   Specimen: BLOOD  Result Value Ref Range Status   Specimen Description BLOOD RIGHT ANTECUBITAL  Final   Special Requests   Final    BOTTLES DRAWN AEROBIC AND ANAEROBIC Blood Culture results may not be optimal due to an inadequate volume of blood received in culture bottles   Culture   Final    NO GROWTH 4 DAYS Performed  at [facility], [place] [address] [place], [place] [address]    Report Status PENDING  Incomplete  Blood Culture (routine x 2)     Status: None (Preliminary result)   Collection Time: 03/04/2020  8:12 PM   Specimen: BLOOD LEFT HAND  Result Value Ref Range Status   Specimen Description BLOOD LEFT HAND  Final   Special Requests   Final    BOTTLES DRAWN AEROBIC AND ANAEROBIC Blood Culture adequate volume   Culture   Final    NO GROWTH 4 DAYS Performed at [facility], [place] [address] [place], [place] [address]    Report Status PENDING  Incomplete    Radiology Reports CT CHEST WO CONTRAST  Result Date: 03/11/2020 CLINICAL DATA:  Pneumonia, unresolved. History of non-Hodgkin's lymphoma. Positive for coronavirus. EXAM: CT CHEST WITHOUT CONTRAST TECHNIQUE: Multidetector CT imaging of the chest was performed following the standard protocol without IV contrast. COMPARISON:  Chest radiograph 02/13/2020 FINDINGS: Cardiovascular: Normal heart size. No pericardial effusion. Aortic atherosclerosis. Coronary artery calcifications. Mediastinum/Nodes: No enlarged mediastinal or axillary lymph nodes. Thyroid gland, trachea, and esophagus demonstrate no significant findings. Lungs/Pleura: There is no pleural effusion. Extensive, bilateral pulmonary ground-glass opacification with patchy areas of nodular  consolidation compatible with atypical viral pneumonia. There is a subpleural bleb identified within the posterior left lung base measuring 5.6 cm, image 145/6. Upper Abdomen: Multiple left lobe of liver cyst noted measuring up to 2.8 cm. No pain acute finding noted within the imaged portions of the upper abdomen. Musculoskeletal: No chest wall mass or suspicious bone lesions identified. IMPRESSION: 1. Extensive, bilateral pulmonary ground-glass opacification with patchy areas of nodular consolidation compatible with atypical viral pneumonia. 2. Coronary artery calcifications noted. 3. Aortic atherosclerosis. Aortic Atherosclerosis (ICD10-I70.0). Electronically Signed   By: Kerby Moors M.D.   On: 03/11/2020 11:37   DG Chest Port 1 View  Result Date: 02/24/2020 CLINICAL DATA:  Short of breath EXAM: PORTABLE CHEST 1 VIEW COMPARISON:  03/05/2020 FINDINGS: Progression of right lower lobe airspace disease. Cardiac silhouette upper normal. Negative for heart failure. Left lung clear. Probable underlying COPD IMPRESSION: COPD.  Progressive right lower lobe infiltrate. Electronically Signed   By: Franchot Gallo M.D.   On: 03/14/2020 16:17   DG Chest Port 1 View  Result Date: 03/05/2020 CLINICAL DATA:  COVID-19. EXAM: PORTABLE CHEST 1 VIEW COMPARISON:  March 03, 2020 FINDINGS: New hazy infiltrates are seen in the lungs, particularly the upper lobes. The cardiomediastinal silhouette is stable. No pneumothorax. No other acute abnormalities. IMPRESSION: New hazy infiltrates, particularly in the upper lobes, worrisome for developing COVID-19 pneumonia given history. Electronically Signed   By: Dorise Bullion III M.D   On: 03/05/2020 08:38   DG Chest Port 1 View  Result Date: 03/03/2020 CLINICAL DATA:  COVID-19 diagnosed 2 weeks ago, increasing shortness of breath, hypoxemia EXAM: PORTABLE CHEST 1 VIEW COMPARISON:  None. FINDINGS: Single frontal view of the chest demonstrates an unremarkable cardiac silhouette.  Diffuse interstitial prominence is seen, with faint ground-glass opacities within the mid and lower lung zones. No effusion or pneumothorax. No acute bony abnormality. IMPRESSION: 1. Interstitial prominence with bilateral ground-glass airspace disease. Differential includes multifocal atypical pneumonia versus edema. Electronically Signed   By: Randa Ngo M.D.   On: 03/03/2020 15:43   ECHOCARDIOGRAM LIMITED  Result Date: 03/11/2020    ECHOCARDIOGRAM LIMITED REPORT   Patient Name:   BURNIS HALLING Date of Exam: 03/11/2020 Medical Rec #:  [id number]  Height:       72.0 in Accession #:    [id number]      Weight:       305.7 lb Date of Birth:  August 25, 1955       BSA:          2.552 m Patient Age:    58 years        BP:           122/68 mmHg Patient Gender: M               HR:           85 bpm. Exam Location:  Inpatient Procedure: Limited Echo, Color Doppler and Cardiac Doppler Indications:   pulmonary hypertension 416.8  History:       Patient has no prior history of Echocardiogram examinations.                Covid.  Sonographer:   Johny Chess Referring      [id number] Lequita Halt Phys: IMPRESSIONS  1. Left ventricular ejection fraction, by estimation, is 55 to 60%. The left ventricle has normal function. The left ventricle has no regional wall motion abnormalities. Left ventricular diastolic parameters were normal.  2. Right ventricular systolic function is normal. The right ventricular size is mildly enlarged. Tricuspid regurgitation signal is inadequate for assessing PA pressure.  3. The aortic valve is tricuspid. Aortic valve regurgitation is not visualized. Mild aortic valve sclerosis is present, with no evidence of aortic valve stenosis.  4. The inferior vena cava is normal in size with greater than 50% respiratory variability, suggesting right atrial pressure of 3 mmHg. FINDINGS  Left Ventricle: Left ventricular ejection fraction, by estimation, is 55 to 60%. The left ventricle has normal function.  The left ventricle has no regional wall motion abnormalities. The left ventricular internal cavity size was normal in size. There is  borderline left ventricular hypertrophy. Right Ventricle: The right ventricular size is mildly enlarged. No increase in right ventricular wall thickness. Right ventricular systolic function is normal. Tricuspid regurgitation signal is inadequate for assessing PA pressure. Pericardium: There is no evidence of pericardial effusion. Presence of pericardial fat pad. Tricuspid Valve: The tricuspid valve is grossly normal. Tricuspid valve regurgitation is trivial. Aortic Valve: The aortic valve is tricuspid. Aortic valve regurgitation is not visualized. Mild aortic valve sclerosis is present, with no evidence of aortic valve stenosis. Mild aortic valve annular calcification. Pulmonic Valve: The pulmonic valve was grossly normal. Pulmonic valve regurgitation is trivial. Venous: The inferior vena cava is normal in size with greater than 50% respiratory variability, suggesting right atrial pressure of 3 mmHg. IAS/Shunts: No atrial level shunt detected by color flow Doppler. LEFT VENTRICLE PLAX 2D LVIDd:         5.00 cm  Diastology LVIDs:         3.50 cm  LV e' lateral:   13.90 cm/s LV PW:         1.10 cm  LV E/e' lateral: 5.1 LV IVS:        1.00 cm  LV e' medial:    12.60 cm/s LVOT diam:     2.30 cm  LV E/e' medial:  5.6 LV SV:         85 LV SV Index:   33 LVOT Area:     4.15 cm  IVC IVC diam: 1.90 cm LEFT ATRIUM         Index LA diam:    4.00 cm 1.57  cm/m  AORTIC VALVE LVOT Vmax:   106.00 cm/s LVOT Vmean:  68.700 cm/s LVOT VTI:    0.205 m MITRAL VALVE MV Area (PHT): 4.60 cm    SHUNTS MV Decel Time: 165 msec    Systemic VTI:  0.20 m MV E velocity: 70.30 cm/s  Systemic Diam: 2.30 cm MV A velocity: 90.80 cm/s MV E/A ratio:  0.77 Rozann Lesches MD Electronically signed by Rozann Lesches MD Signature Date/Time: 03/11/2020/4:40:56 PM    Final      Phillips Climes M.D on 03/14/2020 at 3:21  PM    [facility] -  Office  [phone]

## 2020-03-15 LAB — COMPREHENSIVE METABOLIC PANEL
ALT: 60 U/L — ABNORMAL HIGH (ref 0–44)
AST: 35 U/L (ref 15–41)
Albumin: 2.9 g/dL — ABNORMAL LOW (ref 3.5–5.0)
Alkaline Phosphatase: 58 U/L (ref 38–126)
Anion gap: 16 — ABNORMAL HIGH (ref 5–15)
BUN: 26 mg/dL — ABNORMAL HIGH (ref 8–23)
CO2: 25 mmol/L (ref 22–32)
Calcium: 8.3 mg/dL — ABNORMAL LOW (ref 8.9–10.3)
Chloride: 95 mmol/L — ABNORMAL LOW (ref 98–111)
Creatinine, Ser: 0.96 mg/dL (ref 0.61–1.24)
GFR calc Af Amer: >60 mL/min (ref >60)
GFR calc non Af Amer: >60 mL/min (ref >60)
Glucose, Bld: 136 mg/dL — ABNORMAL HIGH (ref 70–99)
Potassium: 4.7 mmol/L (ref 3.5–5.1)
Sodium: 136 mmol/L (ref 135–145)
Total Bilirubin: 1 mg/dL (ref 0.3–1.2)
Total Protein: 5.5 g/dL — ABNORMAL LOW (ref 6.5–8.1)

## 2020-03-15 LAB — CBC WITH DIFFERENTIAL/PLATELET
Abs Immature Granulocytes: 0.09 10*3/uL — ABNORMAL HIGH (ref 0.00–0.07)
Basophils Absolute: 0 10*3/uL (ref 0.0–0.1)
Basophils Relative: 0 %
Eosinophils Absolute: 0 10*3/uL (ref 0.0–0.5)
Eosinophils Relative: 0 %
HCT: 40.7 % (ref 39.0–52.0)
Hemoglobin: 13.4 g/dL (ref 13.0–17.0)
Immature Granulocytes: 1 %
Lymphocytes Relative: 1 %
Lymphs Abs: 0.2 10*3/uL — ABNORMAL LOW (ref 0.7–4.0)
MCH: 29.6 pg (ref 26.0–34.0)
MCHC: 32.9 g/dL (ref 30.0–36.0)
MCV: 89.8 fL (ref 80.0–100.0)
Monocytes Absolute: 0.6 10*3/uL (ref 0.1–1.0)
Monocytes Relative: 4 %
Neutro Abs: 12.6 10*3/uL — ABNORMAL HIGH (ref 1.7–7.7)
Neutrophils Relative %: 94 %
Platelets: 339 10*3/uL (ref 150–400)
RBC: 4.53 MIL/uL (ref 4.22–5.81)
RDW: 14 % (ref 11.5–15.5)
WBC: 13.4 10*3/uL — ABNORMAL HIGH (ref 4.0–10.5)
nRBC: 0 % (ref 0.0–0.2)

## 2020-03-15 LAB — CULTURE, BLOOD (ROUTINE X 2)
Culture: NO GROWTH
Culture: NO GROWTH
Special Requests: ADEQUATE

## 2020-03-15 LAB — GLUCOSE, CAPILLARY
Glucose-Capillary: 104 mg/dL — ABNORMAL HIGH (ref 70–99)
Glucose-Capillary: 175 mg/dL — ABNORMAL HIGH (ref 70–99)
Glucose-Capillary: 188 mg/dL — ABNORMAL HIGH (ref 70–99)
Glucose-Capillary: 229 mg/dL — ABNORMAL HIGH (ref 70–99)

## 2020-03-15 LAB — C-REACTIVE PROTEIN: CRP: 0.8 mg/dL (ref <1.0)

## 2020-03-15 LAB — D-DIMER, QUANTITATIVE: D-Dimer, Quant: 0.41 ug/mL-FEU (ref 0.00–0.50)

## 2020-03-15 MED ORDER — METHYLPREDNISOLONE SODIUM SUCC 125 MG IJ SOLR
60.0000 mg | Freq: Two times a day (BID) | INTRAMUSCULAR | Status: DC
  Administered 2020-03-16 – 2020-03-19 (×8): 60 mg via INTRAVENOUS
  Filled 2020-03-15 (×8): qty 2

## 2020-03-15 NOTE — Progress Notes (Signed)
Pt is on 15L HFNC ([facility]) unable to wear CPAP due to the amount of 02 he requires.  Pt will be using NRB if needed while he sleeps.

## 2020-03-15 NOTE — Progress Notes (Signed)
Physical Therapy Treatment Patient Details Name: Michael Wolf MRN: [id number] DOB: 28-Oct-1955 Today's Date: 03/15/2020    History of Present Illness Pt is a 64 y.o. male recently d/c home (03/09/20) on 2-4L O2 from COVID PNA admission, now readmitted 03/01/2020 with worsening SOB and cough, found to have significant hypoxia requiring 15L HFNC + 15L NRB. PMH includes HTN, HLD, non-Hodgkin's lymphoma in remission, morbid obesity. Pt vaccinated.   PT Comments    Pt slowly progressing with mobility. Moving well, but still quick to desaturate with minimal standing activity, requiring significant amount of time to recover this session. SpO2 down to 75% on 12L O2 HFNC, requiring >5 min seated rest to recover to 81% on final bout of activity, provided 15L NRB with quick return to 94%. Pt left on 12L O2 HFNC with SpO2 88-90%. Will continue to follow acutely.    Follow Up Recommendations  Home health PT     Equipment Recommendations  None recommended by PT    Recommendations for Other Services       Precautions / Restrictions Precautions Precautions: Other (comment) Precaution Comments: watch SpO2, quick to desaturate and prolonged recovery time - required 15L NRB on 9/1 Restrictions Weight Bearing Restrictions: No    Mobility  Bed Mobility               General bed mobility comments: in chair  Transfers Overall transfer level: Independent Equipment used: None             General transfer comment: Multiple sit<>stands from recliner, reliant on UE support  Ambulation/Gait             General Gait Details: Focused on sit<>stands and standing therex; deferred ambulation secondary to hypoxia and difficulty recovering   Stairs             Wheelchair Mobility    Modified Rankin (Stroke Patients Only)       Balance Overall balance assessment: Needs assistance Sitting-balance support: Feet supported Sitting balance-Leahy Scale: Good     Standing balance  support: No upper extremity supported;During functional activity Standing balance-Leahy Scale: Good Standing balance comment: Independent to stand and use urinal                            Cognition Arousal/Alertness: Awake/alert Behavior During Therapy: WFL for tasks assessed/performed Overall Cognitive Status: Within Functional Limits for tasks assessed                                        Exercises Other Exercises Other Exercises: Can only tolerate 3x repeated sit<>stand before needing prolonged rest    General Comments General comments (skin integrity, edema, etc.): SpO2 down to 75% on 12L O2 HFNC with standing activity, recovering to 86% with >5 min sitting rest; down again with next activity and recovering to 81% therefore 15L NRB provided with quick increase to 94%; NRB removed and left on 12L HFNC      Pertinent Vitals/Pain Pain Assessment: No/denies pain Pain Intervention(s): Monitored during session    Home Living                      Prior Function            PT Goals (current goals can now be found in the care plan section) Progress towards PT goals: Progressing toward  goals    Frequency    Min 3X/week      PT Plan Current plan remains appropriate    Co-evaluation              AM-PAC PT "6 Clicks" Mobility   Outcome Measure  Help needed turning from your back to your side while in a flat bed without using bedrails?: None Help needed moving from lying on your back to sitting on the side of a flat bed without using bedrails?: None Help needed moving to and from a bed to a chair (including a wheelchair)?: None Help needed standing up from a chair using your arms (e.g., wheelchair or bedside chair)?: None Help needed to walk in hospital room?: A Little Help needed climbing 3-5 steps with a railing? : A Little 6 Click Score: 22    End of Session Equipment Utilized During Treatment: Oxygen Activity Tolerance:  Patient tolerated treatment well;Treatment limited secondary to medical complications (Comment) (hypoxia) Patient left: in chair;with call bell/phone within reach Nurse Communication: Mobility status PT Visit Diagnosis: Difficulty in walking, not elsewhere classified (R26.2);Muscle weakness (generalized) (M62.81)     Time: 9166-0600 PT Time Calculation (min) (ACUTE ONLY): 26 min  Charges:  $Therapeutic Exercise: 8-22 mins $Therapeutic Activity: 8-22 mins                     Mabeline Caras, PT, DPT Acute Rehabilitation Services  Pager [phone] Office [place] 03/15/2020, 10:46 AM

## 2020-03-15 NOTE — Progress Notes (Signed)
PROGRESS NOTE                                                                                                                                                                                                             Patient Demographics:    Michael Wolf, is a 64 y.o. male, DOB - [birth year], JGO:[id number]  Outpatient Primary MD for the patient is [name], MD   Admit date - 02/16/2020   LOS - 5  Chief Complaint  Patient presents with  . Shortness of Breath       Brief Narrative: Patient is a 64 y.o. male with PMHx of HTN, HLD, DM-2, non-Hodgkin's lymphoma in remission, OSA on CPAP, morbid obesity-recent breakthrough COVID-19 PNA with hypoxemia-discharged home on 8/26 with home O2-presented back to the ED on 8/27 with significant worsening of hypoxemia.  See below for further details.  COVID-19 vaccinated status: Vaccinated  Significant Events: 8/20-8/26>> hospitalized for hypoxia due to COVID-19-discharged on home O2-2 L 8/27>> readmitted for worsening hypoxemia-now requiring 15 L of HFNC.  Significant studies: 8/27>>Chest x-ray: COPD, progressive right lower lobe infiltrate. 8/28>> CT chest: Extensive bilateral pulmonary groundglass opacification 8/28>> Echo: EF 55-60%  COVID-19 medications: Steroids: 8/20>> Remdesivir: 8/20>> 8/24 Baricitinib: 8/20>> 8/26, 8/28>>  Antibiotics: None  Microbiology data: 8/27 >>blood culture: Negative  Procedures: None  Consults: None  DVT prophylaxis: Lovenox    Subjective:    Michael Wolf today feels unchanged-on around 10-12 L of oxygen.   Assessment  & Plan :   Acute Hypoxic Resp Failure due to Covid 19 Viral pneumonia: Has severe hypoxemia-on 10-for 12 L of HFNC-remains on steroids and baricitinib.  Continues to have some amount of mild lower extremity edema-reasonable to continue IV Lasix to attempt to keep in negative balance.  Continue  supportive care and attempts to titrate down FiO2.  Fever: afebrile O2 requirements:  SpO2: 92 % O2 Flow Rate (L/min): 4 L/min FiO2 (%): 100 %   COVID-19 Labs: Recent Labs    03/13/20 0246 03/14/20 0319 03/14/20 0703 03/15/20 0355  DDIMER 0.44  --  0.73* 0.41  CRP 2.9* 1.6*  --  0.8       Component Value Date/Time   BNP 39.4 03/14/2020 0319    Recent Labs  Lab 03/09/20 0333 03/09/2020 1613  PROCALCITON <0.10 <0.10    Lab Results  Component Value Date   SARSCOV2NAA POSITIVE (A) 03/03/2020     Prone/Incentive Spirometry: encouraged  incentive spirometry use 3-4/hour.  HTN: BP stable-antihypertensives remain on hold.  Resume when able.  HLD: Continue statin/Zetia.  DM-2 (A1c 6.9 on 03/08/2020): CBG stable-continue SSI  Recent Labs    03/14/20 2121 03/15/20 0817 03/15/20 1156  GLUCAP 158* 104* 175*   History of diffuse large B-cell lymphoma:  in remission-follows with oncology at [facility] is to resume usual outpatient follow-up post discharge.  OSA: Continue CPAP  Morbid Obesity: Estimated body mass index is 41.46 kg/m as calculated from the following:   Height as of this encounter: 6' (1.829 m).   Weight as of this encounter: 138.7 kg.    ABG: No results found for: PHART, PCO2ART, PO2ART, HCO3, TCO2, ACIDBASEDEF, O2SAT  Vent Settings: N/A  Condition -  Guarded  Family Communication  :  Spouse Joaquim Lai [phone]) updated over the phone 9/1  Code Status :  Full Code  Diet :  Diet Order            Diet Heart Room service appropriate? Yes; Fluid consistency: Thin  Diet effective now                  Disposition Plan  :   Status is: Inpatient  Remains inpatient appropriate because:Inpatient level of care appropriate due to severity of illness   Dispo: The patient is from: Home              Anticipated d/c is to: Home              Anticipated d/c date is: > 3 days              Patient currently is not medically stable to  d/c.   Barriers to discharge: Hypoxia requiring O2 supplementation/complete 5 days of IV Remdesivir  Antimicorbials  :    Anti-infectives (From admission, onward)   None      Inpatient Medications  Scheduled Meds: . baricitinib  4 mg Oral Daily  . enoxaparin (LOVENOX) injection  70 mg Subcutaneous Q24H  . ezetimibe  10 mg Oral Daily  . furosemide  40 mg Intravenous Daily  . gabapentin  300 mg Oral Q12H  . insulin aspart  0-15 Units Subcutaneous TID WC  . loratadine  10 mg Oral Daily  . methylPREDNISolone (SOLU-MEDROL) injection  60 mg Intravenous Q8H  . pantoprazole  40 mg Oral Daily  . rosuvastatin  40 mg Oral Daily  . senna-docusate  2 tablet Oral BID  . venlafaxine XR  75 mg Oral Q breakfast   Continuous Infusions: PRN Meds:.clonazePAM, polyethylene glycol, sodium chloride   Time Spent in minutes  25  See all Orders from today for further details   Oren Binet M.D on 03/15/2020 at 3:13 PM  To page go to [email or web address] - use universal password  [facility] -  Office  [phone]    Objective:   Vitals:   03/15/20 1100 03/15/20 1158 03/15/20 1200 03/15/20 1435  BP:  102/64 102/64 125/78  Pulse: 96 (!) 115 (!) 108 (!) 111  Resp:  20 (!) 22 (!) 27  Temp:  98.1 F (36.7 C) 98.1 F (36.7 C) 98 F (36.7 C)  TempSrc:  Oral  Oral  SpO2:  92% 92%   Weight:      Height:        Wt Readings from Last 3 Encounters:  02/24/2020 (!) 138.7 kg  03/03/20 136.1  kg  12/01/19 (!) 149.5 kg     Intake/Output Summary (Last 24 hours) at 03/15/2020 1513 Last data filed at 03/15/2020 0441 Gross per 24 hour  Intake 240 ml  Output 800 ml  Net -560 ml     Physical Exam Gen Exam:Alert awake-not in any distress HEENT:atraumatic, normocephalic Chest: B/L clear to auscultation anteriorly CVS:S1S2 regular Abdomen:soft non tender, non distended Extremities:+ edema Neurology: Non focal Skin: no rash   Data Review:    CBC Recent Labs  Lab 03/11/20 0103  03/12/20 0320 03/13/20 0246 03/14/20 0319 03/15/20 0355  WBC 8.5 9.7 13.9* 11.2* 13.4*  HGB 13.2 12.8* 13.1 12.9* 13.4  HCT 39.9 39.7 40.8 40.3 40.7  PLT 324 357 369 331 339  MCV 89.5 89.6 88.9 89.0 89.8  MCH 29.6 28.9 28.5 28.5 29.6  MCHC 33.1 32.2 32.1 32.0 32.9  RDW 14.5 14.3 14.1 13.8 14.0  LYMPHSABS 0.1* 0.2* 0.3* 0.2* 0.2*  MONOABS 0.4 0.3 0.8 0.4 0.6  EOSABS 0.0 0.0 0.0 0.0 0.0  BASOSABS 0.0 0.0 0.0 0.0 0.0    Chemistries  Recent Labs  Lab 03/11/20 0103 03/12/20 0320 03/13/20 0246 03/14/20 0319 03/15/20 0355  NA 138 138 140 138 136  K 4.2 4.1 3.9 4.3 4.7  CL 101 98 98 98 95*  CO2 25 29 30 29 25   GLUCOSE 137* 194* 148* 156* 136*  BUN 19 24* 29* 25* 26*  CREATININE 0.92 1.11 1.21 1.00 0.96  CALCIUM 8.4* 8.8* 8.8* 8.5* 8.3*  MG 2.4  --   --   --   --   AST 32 39 47* 40 35  ALT 43 45* 62* 71* 60*  ALKPHOS 55 53 63 60 58  BILITOT 1.0 1.1 0.5 0.8 1.0   ------------------------------------------------------------------------------------------------------------------ No results for input(s): CHOL, HDL, LDLCALC, TRIG, CHOLHDL, LDLDIRECT in the last 72 hours.  Lab Results  Component Value Date   HGBA1C 6.9 (H) 03/08/2020   ------------------------------------------------------------------------------------------------------------------ No results for input(s): TSH, T4TOTAL, T3FREE, THYROIDAB in the last 72 hours.  Invalid input(s): FREET3 ------------------------------------------------------------------------------------------------------------------ No results for input(s): VITAMINB12, FOLATE, FERRITIN, TIBC, IRON, RETICCTPCT in the last 72 hours.  Coagulation profile No results for input(s): INR, PROTIME in the last 168 hours.  Recent Labs    03/14/20 0703 03/15/20 0355  DDIMER 0.73* 0.41    Cardiac Enzymes No results for input(s): CKMB, TROPONINI, MYOGLOBIN in the last 168 hours.  Invalid input(s):  CK ------------------------------------------------------------------------------------------------------------------    Component Value Date/Time   BNP 39.4 03/14/2020 0319    Micro Results Recent Results (from the past 240 hour(s))  Blood Culture (routine x 2)     Status: None   Collection Time: 02/19/2020  4:12 PM   Specimen: BLOOD  Result Value Ref Range Status   Specimen Description BLOOD RIGHT ANTECUBITAL  Final   Special Requests   Final    BOTTLES DRAWN AEROBIC AND ANAEROBIC Blood Culture results may not be optimal due to an inadequate volume of blood received in culture bottles   Culture   Final    NO GROWTH 5 DAYS Performed at [facility], [place] [address], [place], [place] [address]    Report Status 03/15/2020 FINAL  Final  Blood Culture (routine x 2)     Status: None   Collection Time: 02/15/2020  8:12 PM   Specimen: BLOOD LEFT HAND  Result Value Ref Range Status   Specimen Description BLOOD LEFT HAND  Final   Special Requests   Final  BOTTLES DRAWN AEROBIC AND ANAEROBIC Blood Culture adequate volume   Culture   Final    NO GROWTH 5 DAYS Performed at [facility], [place] [address] [place], [place] [address]    Report Status 03/15/2020 FINAL  Final    Radiology Reports CT CHEST WO CONTRAST  Result Date: 03/11/2020 CLINICAL DATA:  Pneumonia, unresolved. History of non-Hodgkin's lymphoma. Positive for coronavirus. EXAM: CT CHEST WITHOUT CONTRAST TECHNIQUE: Multidetector CT imaging of the chest was performed following the standard protocol without IV contrast. COMPARISON:  Chest radiograph 02/23/2020 FINDINGS: Cardiovascular: Normal heart size. No pericardial effusion. Aortic atherosclerosis. Coronary artery calcifications. Mediastinum/Nodes: No enlarged mediastinal or axillary lymph nodes. Thyroid gland, trachea, and esophagus demonstrate no significant findings. Lungs/Pleura: There is no pleural effusion. Extensive, bilateral pulmonary ground-glass  opacification with patchy areas of nodular consolidation compatible with atypical viral pneumonia. There is a subpleural bleb identified within the posterior left lung base measuring 5.6 cm, image 145/6. Upper Abdomen: Multiple left lobe of liver cyst noted measuring up to 2.8 cm. No pain acute finding noted within the imaged portions of the upper abdomen. Musculoskeletal: No chest wall mass or suspicious bone lesions identified. IMPRESSION: 1. Extensive, bilateral pulmonary ground-glass opacification with patchy areas of nodular consolidation compatible with atypical viral pneumonia. 2. Coronary artery calcifications noted. 3. Aortic atherosclerosis. Aortic Atherosclerosis (ICD10-I70.0). Electronically Signed   By: Kerby Moors M.D.   On: 03/11/2020 11:37   DG Chest Port 1 View  Result Date: 03/13/2020 CLINICAL DATA:  Short of breath EXAM: PORTABLE CHEST 1 VIEW COMPARISON:  03/05/2020 FINDINGS: Progression of right lower lobe airspace disease. Cardiac silhouette upper normal. Negative for heart failure. Left lung clear. Probable underlying COPD IMPRESSION: COPD.  Progressive right lower lobe infiltrate. Electronically Signed   By: Franchot Gallo M.D.   On: 03/08/2020 16:17   DG Chest Port 1 View  Result Date: 03/05/2020 CLINICAL DATA:  COVID-19. EXAM: PORTABLE CHEST 1 VIEW COMPARISON:  March 03, 2020 FINDINGS: New hazy infiltrates are seen in the lungs, particularly the upper lobes. The cardiomediastinal silhouette is stable. No pneumothorax. No other acute abnormalities. IMPRESSION: New hazy infiltrates, particularly in the upper lobes, worrisome for developing COVID-19 pneumonia given history. Electronically Signed   By: Dorise Bullion III M.D   On: 03/05/2020 08:38   DG Chest Port 1 View  Result Date: 03/03/2020 CLINICAL DATA:  COVID-19 diagnosed 2 weeks ago, increasing shortness of breath, hypoxemia EXAM: PORTABLE CHEST 1 VIEW COMPARISON:  None. FINDINGS: Single frontal view of the chest  demonstrates an unremarkable cardiac silhouette. Diffuse interstitial prominence is seen, with faint ground-glass opacities within the mid and lower lung zones. No effusion or pneumothorax. No acute bony abnormality. IMPRESSION: 1. Interstitial prominence with bilateral ground-glass airspace disease. Differential includes multifocal atypical pneumonia versus edema. Electronically Signed   By: Randa Ngo M.D.   On: 03/03/2020 15:43   ECHOCARDIOGRAM LIMITED  Result Date: 03/11/2020    ECHOCARDIOGRAM LIMITED REPORT   Patient Name:   KAELOB PERSKY Date of Exam: 03/11/2020 Medical Rec #:  [id number]       Height:       72.0 in Accession #:    [id number]      Weight:       305.7 lb Date of Birth:  06-May-1956       BSA:          2.552 m Patient Age:    56 years        BP:  122/68 mmHg Patient Gender: M               HR:           85 bpm. Exam Location:  Inpatient Procedure: Limited Echo, Color Doppler and Cardiac Doppler Indications:   pulmonary hypertension 416.8  History:       Patient has no prior history of Echocardiogram examinations.                Covid.  Sonographer:   Johny Chess Referring      [id number] Lequita Halt Phys: IMPRESSIONS  1. Left ventricular ejection fraction, by estimation, is 55 to 60%. The left ventricle has normal function. The left ventricle has no regional wall motion abnormalities. Left ventricular diastolic parameters were normal.  2. Right ventricular systolic function is normal. The right ventricular size is mildly enlarged. Tricuspid regurgitation signal is inadequate for assessing PA pressure.  3. The aortic valve is tricuspid. Aortic valve regurgitation is not visualized. Mild aortic valve sclerosis is present, with no evidence of aortic valve stenosis.  4. The inferior vena cava is normal in size with greater than 50% respiratory variability, suggesting right atrial pressure of 3 mmHg. FINDINGS  Left Ventricle: Left ventricular ejection fraction, by estimation, is  55 to 60%. The left ventricle has normal function. The left ventricle has no regional wall motion abnormalities. The left ventricular internal cavity size was normal in size. There is  borderline left ventricular hypertrophy. Right Ventricle: The right ventricular size is mildly enlarged. No increase in right ventricular wall thickness. Right ventricular systolic function is normal. Tricuspid regurgitation signal is inadequate for assessing PA pressure. Pericardium: There is no evidence of pericardial effusion. Presence of pericardial fat pad. Tricuspid Valve: The tricuspid valve is grossly normal. Tricuspid valve regurgitation is trivial. Aortic Valve: The aortic valve is tricuspid. Aortic valve regurgitation is not visualized. Mild aortic valve sclerosis is present, with no evidence of aortic valve stenosis. Mild aortic valve annular calcification. Pulmonic Valve: The pulmonic valve was grossly normal. Pulmonic valve regurgitation is trivial. Venous: The inferior vena cava is normal in size with greater than 50% respiratory variability, suggesting right atrial pressure of 3 mmHg. IAS/Shunts: No atrial level shunt detected by color flow Doppler. LEFT VENTRICLE PLAX 2D LVIDd:         5.00 cm  Diastology LVIDs:         3.50 cm  LV e' lateral:   13.90 cm/s LV PW:         1.10 cm  LV E/e' lateral: 5.1 LV IVS:        1.00 cm  LV e' medial:    12.60 cm/s LVOT diam:     2.30 cm  LV E/e' medial:  5.6 LV SV:         85 LV SV Index:   33 LVOT Area:     4.15 cm  IVC IVC diam: 1.90 cm LEFT ATRIUM         Index LA diam:    4.00 cm 1.57 cm/m  AORTIC VALVE LVOT Vmax:   106.00 cm/s LVOT Vmean:  68.700 cm/s LVOT VTI:    0.205 m MITRAL VALVE MV Area (PHT): 4.60 cm    SHUNTS MV Decel Time: 165 msec    Systemic VTI:  0.20 m MV E velocity: 70.30 cm/s  Systemic Diam: 2.30 cm MV A velocity: 90.80 cm/s MV E/A ratio:  0.77 Rozann Lesches MD Electronically signed by Rozann Lesches MD Signature Date/Time:  03/11/2020/4:40:56 PM    Final

## 2020-03-15 DEATH — deceased

## 2020-03-16 LAB — COMPREHENSIVE METABOLIC PANEL
ALT: 58 U/L — ABNORMAL HIGH (ref 0–44)
AST: 36 U/L (ref 15–41)
Albumin: 3.5 g/dL (ref 3.5–5.0)
Alkaline Phosphatase: 72 U/L (ref 38–126)
Anion gap: 17 — ABNORMAL HIGH (ref 5–15)
BUN: 29 mg/dL — ABNORMAL HIGH (ref 8–23)
CO2: 28 mmol/L (ref 22–32)
Calcium: 8.8 mg/dL — ABNORMAL LOW (ref 8.9–10.3)
Chloride: 93 mmol/L — ABNORMAL LOW (ref 98–111)
Creatinine, Ser: 1.1 mg/dL (ref 0.61–1.24)
GFR calc Af Amer: >60 mL/min (ref >60)
GFR calc non Af Amer: >60 mL/min (ref >60)
Glucose, Bld: 139 mg/dL — ABNORMAL HIGH (ref 70–99)
Potassium: 4.2 mmol/L (ref 3.5–5.1)
Sodium: 138 mmol/L (ref 135–145)
Total Bilirubin: 1.5 mg/dL — ABNORMAL HIGH (ref 0.3–1.2)
Total Protein: 6.6 g/dL (ref 6.5–8.1)

## 2020-03-16 LAB — GLUCOSE, CAPILLARY
Glucose-Capillary: 129 mg/dL — ABNORMAL HIGH (ref 70–99)
Glucose-Capillary: 139 mg/dL — ABNORMAL HIGH (ref 70–99)
Glucose-Capillary: 175 mg/dL — ABNORMAL HIGH (ref 70–99)
Glucose-Capillary: 150 mg/dL — ABNORMAL HIGH (ref 70–99)

## 2020-03-16 LAB — CBC
HCT: 47.7 % (ref 39.0–52.0)
Hemoglobin: 15.9 g/dL (ref 13.0–17.0)
MCH: 29.4 pg (ref 26.0–34.0)
MCHC: 33.3 g/dL (ref 30.0–36.0)
MCV: 88.2 fL (ref 80.0–100.0)
Platelets: 372 10*3/uL (ref 150–400)
RBC: 5.41 MIL/uL (ref 4.22–5.81)
RDW: 14.2 % (ref 11.5–15.5)
WBC: 19.3 10*3/uL — ABNORMAL HIGH (ref 4.0–10.5)
nRBC: 0 % (ref 0.0–0.2)

## 2020-03-16 LAB — C-REACTIVE PROTEIN: CRP: 1 mg/dL — ABNORMAL HIGH (ref <1.0)

## 2020-03-16 LAB — D-DIMER, QUANTITATIVE: D-Dimer, Quant: 0.56 ug/mL-FEU — ABNORMAL HIGH (ref 0.00–0.50)

## 2020-03-16 LAB — FERRITIN: Ferritin: 1350 ng/mL — ABNORMAL HIGH (ref 24–336)

## 2020-03-16 NOTE — Progress Notes (Signed)
Pt is on 15L HFNC (SALTER) and NRB if needed. Pt  unable to wear CPAP due to the amount of 02 he requires.

## 2020-03-16 NOTE — Progress Notes (Signed)
   03/16/20 1350  Assess: MEWS Score  Temp 99.5 F (37.5 C)  BP (!) 119/96  Pulse Rate (!) 119  ECG Heart Rate (!) 120  Resp (!) 26  SpO2 91 %  Assess: MEWS Score  MEWS Temp 0  MEWS Systolic 0  MEWS Pulse 2  MEWS RR 2  MEWS LOC 0  MEWS Score 4  MEWS Score Color Red  Assess: if the MEWS score is Yellow or Red  Were vital signs taken at a resting state? Yes  Focused Assessment No change from prior assessment  Early Detection of Sepsis Score *See Row Information* High  MEWS guidelines implemented *See Row Information* Yes  Treat  MEWS Interventions Escalated (See documentation below)  Pain Scale 0-10  Pain Score 0  Take Vital Signs  Increase Vital Sign Frequency  Red: Q 1hr X 4 then Q 4hr X 4, if remains red, continue Q 4hrs  Escalate  MEWS: Escalate Red: discuss with charge nurse/RN and provider, consider discussing with RRT  Notify: Charge Nurse/RN  Name of Charge Nurse/RN Notified Precious Gilding, RN  Date Charge Nurse/RN Notified 03/16/20  Time Charge Nurse/RN Notified 1410  Notify: Provider  Provider Name/Title Dr. Sloan Leiter  Date Provider Notified 03/16/20  Time Provider Notified 1410  Notification Type Page  Notify: Rapid Response  Name of Rapid Response RN Notified n/a  Document  Progress note created (see row info) Yes

## 2020-03-16 NOTE — Progress Notes (Signed)
PROGRESS NOTE                                                                                                                                                                                                             Patient Demographics:    Michael Wolf, is a 64 y.o. male, DOB - [birth year], RAQ:[id number]  Outpatient Primary MD for the patient is [name], MD   Admit date - 02/17/2020   LOS - 6  Chief Complaint  Patient presents with  . Shortness of Breath       Brief Narrative: Patient is a 64 y.o. male with PMHx of HTN, HLD, DM-2, non-Hodgkin's lymphoma in remission, OSA on CPAP, morbid obesity-recent breakthrough COVID-19 PNA with hypoxemia-discharged home on 8/26 with home O2-presented back to the ED on 8/27 with significant worsening of hypoxemia.  See below for further details.  COVID-19 vaccinated status: Vaccinated  Significant Events: 8/20-8/26>> hospitalized for hypoxia due to COVID-19-discharged on home O2-2 L 8/27>> readmitted for worsening hypoxemia-now requiring 15 L of HFNC.  Significant studies: 8/27>>Chest x-ray: COPD, progressive right lower lobe infiltrate. 8/28>> CT chest: Extensive bilateral pulmonary groundglass opacification 8/28>> Echo: EF 55-60%  COVID-19 medications: Steroids: 8/20>> Remdesivir: 8/20>> 8/24 Baricitinib: 8/20>> 8/26, 8/28>>  Antibiotics: None  Microbiology data: 8/27 >>blood culture: Negative  Procedures: None  Consults: None  DVT prophylaxis: Lovenox    Subjective:   He feels better but still on 15 L of HFNC with occasional use of NRB.  He appears comfortable.   Assessment  & Plan :   Acute Hypoxic Resp Failure due to Covid 19 Viral pneumonia: Continues to have severe hypoxemia-on 15 L of HFNC with NRB at times.  No signs of volume overload-we will go ahead and stop Lasix today.  Continue steroids and baricitinib.  Continue to attempt to  slowly titrate down FiO2.  Monitor closely-if he deteriorates significantly-then may need transfer to the ICU.  Fever: afebrile O2 requirements:  SpO2: 90 % O2 Flow Rate (L/min): 15 L/min FiO2 (%): 100 %   COVID-19 Labs: Recent Labs    03/14/20 0319 03/14/20 0703 03/15/20 0355  DDIMER  --  0.73* 0.41  CRP 1.6*  --  0.8       Component Value Date/Time   BNP 39.4 03/14/2020 0319    Recent Labs  Lab 02/17/2020 1613  PROCALCITON <0.10    Lab Results  Component Value Date   SARSCOV2NAA POSITIVE (A) 03/03/2020     Prone/Incentive Spirometry: encouraged  incentive spirometry use 3-4/hour.  HTN: BP stable-antihypertensives remain on hold.  Resume when able.  HLD: Continue statin/Zetia.  DM-2 (A1c 6.9 on 03/08/2020): CBG stable-continue SSI  Recent Labs    03/15/20 1641 03/15/20 2106 03/16/20 0749  GLUCAP 188* 229* 129*   History of diffuse large B-cell lymphoma:  in remission-follows with oncology at [facility] is to resume usual outpatient follow-up post discharge.  OSA: Continue CPAP  Palliative care discussion: Long discussion with patient and then with his wife over the phone.  Both aware that he has severe parenchymal injury due to COVID-19 resulting in severe hypoxemic respiratory failure.  Both understand that he may not survive this hospitalization-and that he remains at risk for further deterioration necessitating ventilator support.  Patient and spouse to have end-of-life discussion-regarding DNR etc today.  I will follow up with them later today.  Morbid Obesity: Estimated body mass index is 41.46 kg/m as calculated from the following:   Height as of this encounter: 6' (1.829 m).   Weight as of this encounter: 138.7 kg.    ABG: No results found for: PHART, PCO2ART, PO2ART, HCO3, TCO2, ACIDBASEDEF, O2SAT  Vent Settings: N/A  Condition -  Guarded  Family Communication  :  Spouse Joaquim Lai [phone]) updated over the phone 9/2  Code Status :  Full  Code  Diet :  Diet Order            Diet Heart Room service appropriate? Yes; Fluid consistency: Thin  Diet effective now                  Disposition Plan  :   Status is: Inpatient  Remains inpatient appropriate because:Inpatient level of care appropriate due to severity of illness   Dispo: The patient is from: Home              Anticipated d/c is to: Home              Anticipated d/c date is: > 3 days              Patient currently is not medically stable to d/c.   Barriers to discharge: Hypoxia requiring O2 supplementation  Antimicorbials  :    Anti-infectives (From admission, onward)   None      Inpatient Medications  Scheduled Meds: . baricitinib  4 mg Oral Daily  . enoxaparin (LOVENOX) injection  70 mg Subcutaneous Q24H  . ezetimibe  10 mg Oral Daily  . furosemide  40 mg Intravenous Daily  . gabapentin  300 mg Oral Q12H  . insulin aspart  0-15 Units Subcutaneous TID WC  . loratadine  10 mg Oral Daily  . methylPREDNISolone (SOLU-MEDROL) injection  60 mg Intravenous BID  . pantoprazole  40 mg Oral Daily  . rosuvastatin  40 mg Oral Daily  . senna-docusate  2 tablet Oral BID  . venlafaxine XR  75 mg Oral Q breakfast   Continuous Infusions: PRN Meds:.clonazePAM, polyethylene glycol, sodium chloride   Time Spent in minutes  25  See all Orders from today for further details   Oren Binet M.D on 03/16/2020 at 11:08 AM  To page go to [email or web address] - use universal password  [facility] -  Office  [phone]    Objective:   Vitals:   03/15/20 2107 03/15/20 2114 03/15/20 2242 03/16/20 8546  BP: 121/72   127/80  Pulse: 98 (!) 113    Resp: 20 20  15   Temp: 98.2 F (36.8 C)   97.9 F (36.6 C)  TempSrc: Oral   Oral  SpO2: 90% (!) 88% 94% 90%  Weight:      Height:        Wt Readings from Last 3 Encounters:  03/14/2020 (!) 138.7 kg  03/03/20 136.1 kg  12/01/19 (!) 149.5 kg     Intake/Output Summary (Last 24 hours) at 03/16/2020  1108 Last data filed at 03/16/2020 1000 Gross per 24 hour  Intake 1140 ml  Output 2425 ml  Net -1285 ml     Gen Exam:Alert awake-not in any distress HEENT:atraumatic, normocephalic Chest: B/L clear to auscultation anteriorly CVS:S1S2 regular Abdomen:soft non tender, non distended Extremities:no edema Neurology: Non focal Skin: no rash   Data Review:    CBC Recent Labs  Lab 03/11/20 0103 03/12/20 0320 03/13/20 0246 03/14/20 0319 03/15/20 0355  WBC 8.5 9.7 13.9* 11.2* 13.4*  HGB 13.2 12.8* 13.1 12.9* 13.4  HCT 39.9 39.7 40.8 40.3 40.7  PLT 324 357 369 331 339  MCV 89.5 89.6 88.9 89.0 89.8  MCH 29.6 28.9 28.5 28.5 29.6  MCHC 33.1 32.2 32.1 32.0 32.9  RDW 14.5 14.3 14.1 13.8 14.0  LYMPHSABS 0.1* 0.2* 0.3* 0.2* 0.2*  MONOABS 0.4 0.3 0.8 0.4 0.6  EOSABS 0.0 0.0 0.0 0.0 0.0  BASOSABS 0.0 0.0 0.0 0.0 0.0    Chemistries  Recent Labs  Lab 03/11/20 0103 03/12/20 0320 03/13/20 0246 03/14/20 0319 03/15/20 0355  NA 138 138 140 138 136  K 4.2 4.1 3.9 4.3 4.7  CL 101 98 98 98 95*  CO2 25 29 30 29 25   GLUCOSE 137* 194* 148* 156* 136*  BUN 19 24* 29* 25* 26*  CREATININE 0.92 1.11 1.21 1.00 0.96  CALCIUM 8.4* 8.8* 8.8* 8.5* 8.3*  MG 2.4  --   --   --   --   AST 32 39 47* 40 35  ALT 43 45* 62* 71* 60*  ALKPHOS 55 53 63 60 58  BILITOT 1.0 1.1 0.5 0.8 1.0   ------------------------------------------------------------------------------------------------------------------ No results for input(s): CHOL, HDL, LDLCALC, TRIG, CHOLHDL, LDLDIRECT in the last 72 hours.  Lab Results  Component Value Date   HGBA1C 6.9 (H) 03/08/2020   ------------------------------------------------------------------------------------------------------------------ No results for input(s): TSH, T4TOTAL, T3FREE, THYROIDAB in the last 72 hours.  Invalid input(s): FREET3 ------------------------------------------------------------------------------------------------------------------ No results  for input(s): VITAMINB12, FOLATE, FERRITIN, TIBC, IRON, RETICCTPCT in the last 72 hours.  Coagulation profile No results for input(s): INR, PROTIME in the last 168 hours.  Recent Labs    03/14/20 0703 03/15/20 0355  DDIMER 0.73* 0.41    Cardiac Enzymes No results for input(s): CKMB, TROPONINI, MYOGLOBIN in the last 168 hours.  Invalid input(s): CK ------------------------------------------------------------------------------------------------------------------    Component Value Date/Time   BNP 39.4 03/14/2020 0319    Micro Results Recent Results (from the past 240 hour(s))  Blood Culture (routine x 2)     Status: None   Collection Time: 02/13/2020  4:12 PM   Specimen: BLOOD  Result Value Ref Range Status   Specimen Description BLOOD RIGHT ANTECUBITAL  Final   Special Requests   Final    BOTTLES DRAWN AEROBIC AND ANAEROBIC Blood Culture results may not be optimal due to an inadequate volume of blood received in culture bottles   Culture   Final    NO GROWTH 5 DAYS Performed at [facility]  [facility], [place] [address], [place], [place] [address]    Report Status 03/15/2020 FINAL  Final  Blood Culture (routine x 2)     Status: None   Collection Time: 02/25/2020  8:12 PM   Specimen: BLOOD LEFT HAND  Result Value Ref Range Status   Specimen Description BLOOD LEFT HAND  Final   Special Requests   Final    BOTTLES DRAWN AEROBIC AND ANAEROBIC Blood Culture adequate volume   Culture   Final    NO GROWTH 5 DAYS Performed at [facility], [place] [address], [place], [place] [address]    Report Status 03/15/2020 FINAL  Final    Radiology Reports CT CHEST WO CONTRAST  Result Date: 03/11/2020 CLINICAL DATA:  Pneumonia, unresolved. History of non-Hodgkin's lymphoma. Positive for coronavirus. EXAM: CT CHEST WITHOUT CONTRAST TECHNIQUE: Multidetector CT imaging of the chest was performed following the standard protocol without IV contrast. COMPARISON:  Chest radiograph 02/21/2020  FINDINGS: Cardiovascular: Normal heart size. No pericardial effusion. Aortic atherosclerosis. Coronary artery calcifications. Mediastinum/Nodes: No enlarged mediastinal or axillary lymph nodes. Thyroid gland, trachea, and esophagus demonstrate no significant findings. Lungs/Pleura: There is no pleural effusion. Extensive, bilateral pulmonary ground-glass opacification with patchy areas of nodular consolidation compatible with atypical viral pneumonia. There is a subpleural bleb identified within the posterior left lung base measuring 5.6 cm, image 145/6. Upper Abdomen: Multiple left lobe of liver cyst noted measuring up to 2.8 cm. No pain acute finding noted within the imaged portions of the upper abdomen. Musculoskeletal: No chest wall mass or suspicious bone lesions identified. IMPRESSION: 1. Extensive, bilateral pulmonary ground-glass opacification with patchy areas of nodular consolidation compatible with atypical viral pneumonia. 2. Coronary artery calcifications noted. 3. Aortic atherosclerosis. Aortic Atherosclerosis (ICD10-I70.0). Electronically Signed   By: Kerby Moors M.D.   On: 03/11/2020 11:37   DG Chest Port 1 View  Result Date: 02/21/2020 CLINICAL DATA:  Short of breath EXAM: PORTABLE CHEST 1 VIEW COMPARISON:  03/05/2020 FINDINGS: Progression of right lower lobe airspace disease. Cardiac silhouette upper normal. Negative for heart failure. Left lung clear. Probable underlying COPD IMPRESSION: COPD.  Progressive right lower lobe infiltrate. Electronically Signed   By: Franchot Gallo M.D.   On: 03/06/2020 16:17   DG Chest Port 1 View  Result Date: 03/05/2020 CLINICAL DATA:  COVID-19. EXAM: PORTABLE CHEST 1 VIEW COMPARISON:  March 03, 2020 FINDINGS: New hazy infiltrates are seen in the lungs, particularly the upper lobes. The cardiomediastinal silhouette is stable. No pneumothorax. No other acute abnormalities. IMPRESSION: New hazy infiltrates, particularly in the upper lobes, worrisome for  developing COVID-19 pneumonia given history. Electronically Signed   By: Dorise Bullion III M.D   On: 03/05/2020 08:38   DG Chest Port 1 View  Result Date: 03/03/2020 CLINICAL DATA:  COVID-19 diagnosed 2 weeks ago, increasing shortness of breath, hypoxemia EXAM: PORTABLE CHEST 1 VIEW COMPARISON:  None. FINDINGS: Single frontal view of the chest demonstrates an unremarkable cardiac silhouette. Diffuse interstitial prominence is seen, with faint ground-glass opacities within the mid and lower lung zones. No effusion or pneumothorax. No acute bony abnormality. IMPRESSION: 1. Interstitial prominence with bilateral ground-glass airspace disease. Differential includes multifocal atypical pneumonia versus edema. Electronically Signed   By: Randa Ngo M.D.   On: 03/03/2020 15:43   ECHOCARDIOGRAM LIMITED  Result Date: 03/11/2020    ECHOCARDIOGRAM LIMITED REPORT   Patient Name:   LESSIE MANIGO Date of Exam: 03/11/2020 Medical Rec #:  [id number]  Height:       72.0 in Accession #:    [id number]      Weight:       305.7 lb Date of Birth:  [birth year]       BSA:          2.552 m Patient Age:    54 years        BP:           122/68 mmHg Patient Gender: M               HR:           85 bpm. Exam Location:  Inpatient Procedure: Limited Echo, Color Doppler and Cardiac Doppler Indications:   pulmonary hypertension 416.8  History:       Patient has no prior history of Echocardiogram examinations.                Covid.  Sonographer:   Johny Chess Referring      [id number] Lequita Halt Phys: IMPRESSIONS  1. Left ventricular ejection fraction, by estimation, is 55 to 60%. The left ventricle has normal function. The left ventricle has no regional wall motion abnormalities. Left ventricular diastolic parameters were normal.  2. Right ventricular systolic function is normal. The right ventricular size is mildly enlarged. Tricuspid regurgitation signal is inadequate for assessing PA pressure.  3. The aortic valve is  tricuspid. Aortic valve regurgitation is not visualized. Mild aortic valve sclerosis is present, with no evidence of aortic valve stenosis.  4. The inferior vena cava is normal in size with greater than 50% respiratory variability, suggesting right atrial pressure of 3 mmHg. FINDINGS  Left Ventricle: Left ventricular ejection fraction, by estimation, is 55 to 60%. The left ventricle has normal function. The left ventricle has no regional wall motion abnormalities. The left ventricular internal cavity size was normal in size. There is  borderline left ventricular hypertrophy. Right Ventricle: The right ventricular size is mildly enlarged. No increase in right ventricular wall thickness. Right ventricular systolic function is normal. Tricuspid regurgitation signal is inadequate for assessing PA pressure. Pericardium: There is no evidence of pericardial effusion. Presence of pericardial fat pad. Tricuspid Valve: The tricuspid valve is grossly normal. Tricuspid valve regurgitation is trivial. Aortic Valve: The aortic valve is tricuspid. Aortic valve regurgitation is not visualized. Mild aortic valve sclerosis is present, with no evidence of aortic valve stenosis. Mild aortic valve annular calcification. Pulmonic Valve: The pulmonic valve was grossly normal. Pulmonic valve regurgitation is trivial. Venous: The inferior vena cava is normal in size with greater than 50% respiratory variability, suggesting right atrial pressure of 3 mmHg. IAS/Shunts: No atrial level shunt detected by color flow Doppler. LEFT VENTRICLE PLAX 2D LVIDd:         5.00 cm  Diastology LVIDs:         3.50 cm  LV e' lateral:   13.90 cm/s LV PW:         1.10 cm  LV E/e' lateral: 5.1 LV IVS:        1.00 cm  LV e' medial:    12.60 cm/s LVOT diam:     2.30 cm  LV E/e' medial:  5.6 LV SV:         85 LV SV Index:   33 LVOT Area:     4.15 cm  IVC IVC diam: 1.90 cm LEFT ATRIUM         Index LA diam:    4.00 cm 1.57  cm/m  AORTIC VALVE LVOT Vmax:   106.00  cm/s LVOT Vmean:  68.700 cm/s LVOT VTI:    0.205 m MITRAL VALVE MV Area (PHT): 4.60 cm    SHUNTS MV Decel Time: 165 msec    Systemic VTI:  0.20 m MV E velocity: 70.30 cm/s  Systemic Diam: 2.30 cm MV A velocity: 90.80 cm/s MV E/A ratio:  0.77 Rozann Lesches MD Electronically signed by Rozann Lesches MD Signature Date/Time: 03/11/2020/4:40:56 PM    Final

## 2020-03-17 ENCOUNTER — Inpatient Hospital Stay (HOSPITAL_COMMUNITY): Payer: Medicare Other

## 2020-03-17 DIAGNOSIS — R0902 Hypoxemia: Secondary | ICD-10-CM

## 2020-03-17 DIAGNOSIS — J9311 Primary spontaneous pneumothorax: Secondary | ICD-10-CM

## 2020-03-17 DIAGNOSIS — J9312 Secondary spontaneous pneumothorax: Secondary | ICD-10-CM

## 2020-03-17 LAB — COMPREHENSIVE METABOLIC PANEL
ALT: 52 U/L — ABNORMAL HIGH (ref 0–44)
AST: 35 U/L (ref 15–41)
Albumin: 3.1 g/dL — ABNORMAL LOW (ref 3.5–5.0)
Alkaline Phosphatase: 66 U/L (ref 38–126)
Anion gap: 11 (ref 5–15)
BUN: 31 mg/dL — ABNORMAL HIGH (ref 8–23)
CO2: 32 mmol/L (ref 22–32)
Calcium: 8.6 mg/dL — ABNORMAL LOW (ref 8.9–10.3)
Chloride: 94 mmol/L — ABNORMAL LOW (ref 98–111)
Creatinine, Ser: 1.03 mg/dL (ref 0.61–1.24)
GFR calc Af Amer: >60 mL/min (ref >60)
GFR calc non Af Amer: >60 mL/min (ref >60)
Glucose, Bld: 167 mg/dL — ABNORMAL HIGH (ref 70–99)
Potassium: 3.9 mmol/L (ref 3.5–5.1)
Sodium: 137 mmol/L (ref 135–145)
Total Bilirubin: 1.3 mg/dL — ABNORMAL HIGH (ref 0.3–1.2)
Total Protein: 5.6 g/dL — ABNORMAL LOW (ref 6.5–8.1)

## 2020-03-17 LAB — FERRITIN: Ferritin: 1170 ng/mL — ABNORMAL HIGH (ref 24–336)

## 2020-03-17 LAB — CBC
HCT: 44.2 % (ref 39.0–52.0)
Hemoglobin: 14.4 g/dL (ref 13.0–17.0)
MCH: 28.6 pg (ref 26.0–34.0)
MCHC: 32.6 g/dL (ref 30.0–36.0)
MCV: 87.9 fL (ref 80.0–100.0)
Platelets: 272 10*3/uL (ref 150–400)
RBC: 5.03 MIL/uL (ref 4.22–5.81)
RDW: 14.1 % (ref 11.5–15.5)
WBC: 15.6 10*3/uL — ABNORMAL HIGH (ref 4.0–10.5)
nRBC: 0 % (ref 0.0–0.2)

## 2020-03-17 LAB — GLUCOSE, CAPILLARY
Glucose-Capillary: 101 mg/dL — ABNORMAL HIGH (ref 70–99)
Glucose-Capillary: 119 mg/dL — ABNORMAL HIGH (ref 70–99)
Glucose-Capillary: 216 mg/dL — ABNORMAL HIGH (ref 70–99)
Glucose-Capillary: 116 mg/dL — ABNORMAL HIGH (ref 70–99)

## 2020-03-17 LAB — D-DIMER, QUANTITATIVE: D-Dimer, Quant: 0.46 ug/mL-FEU (ref 0.00–0.50)

## 2020-03-17 LAB — C-REACTIVE PROTEIN: CRP: 1.5 mg/dL — ABNORMAL HIGH (ref <1.0)

## 2020-03-17 MED ORDER — OXYCODONE HCL 5 MG PO TABS
5.0000 mg | ORAL_TABLET | Freq: Four times a day (QID) | ORAL | Status: DC | PRN
  Administered 2020-03-17: 5 mg via ORAL
  Filled 2020-03-17: qty 1

## 2020-03-17 MED ORDER — METOPROLOL TARTRATE 5 MG/5ML IV SOLN
5.0000 mg | Freq: Once | INTRAVENOUS | Status: AC
  Administered 2020-03-17: 5 mg via INTRAVENOUS
  Filled 2020-03-17: qty 5

## 2020-03-17 MED ORDER — ENOXAPARIN SODIUM 80 MG/0.8ML ~~LOC~~ SOLN
70.0000 mg | Freq: Two times a day (BID) | SUBCUTANEOUS | Status: DC
  Administered 2020-03-17 – 2020-03-18 (×2): 70 mg via SUBCUTANEOUS
  Filled 2020-03-17 (×2): qty 0.8

## 2020-03-17 MED ORDER — ACETAMINOPHEN 325 MG PO TABS
650.0000 mg | ORAL_TABLET | Freq: Four times a day (QID) | ORAL | Status: DC | PRN
  Administered 2020-03-17 (×2): 650 mg via ORAL
  Filled 2020-03-17 (×2): qty 2

## 2020-03-17 MED ORDER — SODIUM CHLORIDE 0.9% FLUSH
10.0000 mL | Freq: Three times a day (TID) | INTRAVENOUS | Status: DC
  Administered 2020-03-17 – 2020-03-18 (×5): 10 mL

## 2020-03-17 NOTE — Progress Notes (Signed)
Physical Therapy Treatment Patient Details Name: Michael Wolf MRN: [id number] DOB: 12/15/1955 Today's Date: 03/17/2020    History of Present Illness Pt is a 64 y.o. male recently d/c home (03/09/20) on 2-4L O2 from COVID PNA admission, now readmitted 03/02/2020 with worsening SOB and cough, found to have significant hypoxia requiring 15L HFNC + 15L NRB. CXR 9/3 with large R pneumothorax s/p chest tube insertion. PMH includes HTN, HLD, non-Hodgkin's lymphoma in remission, morbid obesity. Pt vaccinated.   PT Comments    Pt with new chest tube and on 15L O2 HFNC + 15L NRB. Demonstrates very poor activity tolerance this session; quick to desaturate with minimal activity, requiring prolonged seated rest to recover. Despite this, pt motivated to participate and hopeful for recovery. SpO2 76-82%; increasing to 86% once pt able to blow nose a few times; HR 110-130s. Will continue to follow acutely and progress activity as tolerated.     Follow Up Recommendations  Home health PT     Equipment Recommendations   (TBD)    Recommendations for Other Services       Precautions / Restrictions Precautions Precautions: Other (comment) Precaution Comments: chest tube; watch SpO2, quick to desaturate and prolonged recovery time - on 15L HFNC + 15L NRB on 9/3 Restrictions Weight Bearing Restrictions: No    Mobility  Bed Mobility               General bed mobility comments: Seated EOB  Transfers Overall transfer level: Needs assistance Equipment used: None Transfers: Sit to/from [facility] Sit to Stand: Min guard Stand pivot transfers: Min guard       General transfer comment: Attempted stand with return to sit, requiring ~60-sec rest before additional attempt, stand and pivot to recliner with close min guard and assist for lines; fatigue and SOB after this requiring prolonged seated rest. SpO2 down to 76% on 15L HFNC + 15L NRB  Ambulation/Gait             General  Gait Details: unable beyond steps to recliner secondary to fatigue/DOE/desaturation   Stairs             Wheelchair Mobility    Modified Rankin (Stroke Patients Only)       Balance Overall balance assessment: Needs assistance Sitting-balance support: Feet supported Sitting balance-Leahy Scale: Good Sitting balance - Comments: prolonged sitting EOB     Standing balance-Leahy Scale: Fair Standing balance comment: Can static stand without UE support, but reaching for HHA or furniture support for stability                            Cognition Arousal/Alertness: Awake/alert Behavior During Therapy: WFL for tasks assessed/performed Overall Cognitive Status: Within Functional Limits for tasks assessed                                        Exercises      General Comments General comments (skin integrity, edema, etc.): Pt request for assist to clean nose and [place]/NRB pieces; when pt removes O2 for this, SpO2 quickly drops to 69% and dropping before O2 replaced for recovery. Once nose cleared, SpO2 up to 86% on 15L HFNC + 15L NRB      Pertinent Vitals/Pain Pain Assessment: Faces Faces Pain Scale: Hurts a little bit Pain Location: Generalized Pain Descriptors / Indicators: Tiring Pain Intervention(s): Monitored  during session    Home Living                      Prior Function            PT Goals (current goals can now be found in the care plan section) Acute Rehab PT Goals Patient Stated Goal: "I hope I can beat this thing" Progress towards PT goals: Not progressing toward goals - comment (limited by hypoxia/fatigue/DOE)    Frequency    Min 3X/week      PT Plan Current plan remains appropriate    Co-evaluation              AM-PAC PT "6 Clicks" Mobility   Outcome Measure  Help needed turning from your back to your side while in a flat bed without using bedrails?: A Little Help needed moving from lying on your  back to sitting on the side of a flat bed without using bedrails?: A Little Help needed moving to and from a bed to a chair (including a wheelchair)?: A Little Help needed standing up from a chair using your arms (e.g., wheelchair or bedside chair)?: A Little Help needed to walk in hospital room?: A Little Help needed climbing 3-5 steps with a railing? : A Lot 6 Click Score: 17    End of Session Equipment Utilized During Treatment: Oxygen Activity Tolerance: Patient limited by fatigue;Treatment limited secondary to medical complications (Comment) Patient left: in chair;with call bell/phone within reach Nurse Communication: Mobility status PT Visit Diagnosis: Difficulty in walking, not elsewhere classified (R26.2);Muscle weakness (generalized) (M62.81)     Time: 1224-4975 PT Time Calculation (min) (ACUTE ONLY): 32 min  Charges:  $Therapeutic Activity: 23-37 mins                     Mabeline Caras, PT, DPT Acute Rehabilitation Services  Pager [phone] Office [place] 03/17/2020, 5:33 PM

## 2020-03-17 NOTE — Procedures (Signed)
Insertion of Chest Tube Procedure Note  Philander Ake  [id number]  Feb 09, 1956  Date:03/17/20  Time:9:14 AM    Provider Performing: Candee Furbish   Procedure: Pleural Catheter Insertion w/o Imaging Guidance [phone])  Indication(s) Pneumothorax  Consent Risks of the procedure as well as the alternatives and risks of each were explained to the patient and/or caregiver.  Consent for the procedure was obtained and is signed in the bedside chart  Anesthesia Topical only with 1% lidocaine    Time Out Verified patient identification, verified procedure, site/side was marked, verified correct patient position, special equipment/implants available, medications/allergies/relevant history reviewed, required imaging and test results available.   Sterile Technique Maximal sterile technique including full sterile barrier drape, hand hygiene, sterile gown, sterile gloves, mask, hair covering, sterile ultrasound probe cover (if used).   Procedure Description Ultrasound not used to identify appropriate pleural anatomy for placement and overlying skin marked. Area of placement cleaned and draped in sterile fashion.  A 14 French pigtail pleural catheter was placed into the right pleural space using Seldinger technique. Appropriate return of air was obtained.  The tube was connected to atrium and placed on -20 cm H2O wall suction.   Complications/Tolerance None; patient tolerated the procedure well. Chest X-ray is ordered to verify placement.   EBL Minimal  Specimen(s) none

## 2020-03-17 NOTE — Progress Notes (Signed)
PROGRESS NOTE                                                                                                                                                                                                             Patient Demographics:    Michael Wolf, is a 64 y.o. male, DOB - 09/27/1955, LNL:[id number]  Outpatient Primary MD for the patient is [name], MD   Admit date - 03/09/2020   LOS - 7  Chief Complaint  Patient presents with  . Shortness of Breath       Brief Narrative: Patient is a 64 y.o. male with PMHx of HTN, HLD, DM-2, non-Hodgkin's lymphoma in remission, OSA on CPAP, morbid obesity-recent breakthrough COVID-19 PNA with hypoxemia-discharged home on 8/26 with home O2-presented back to the ED on 8/27 with significant worsening of hypoxemia.  See below for further details.  COVID-19 vaccinated status: Vaccinated  Significant Events: 8/20-8/26>> hospitalized for hypoxia due to COVID-19-discharged on home O2-2 L 8/27>> readmitted for worsening hypoxemia-now requiring 15 L of HFNC. 9/3>> chest x-ray with large right pneumothorax-PCCM consulted for chest tube placement  Significant studies: 8/27>>Chest x-ray: COPD, progressive right lower lobe infiltrate. 8/28>> CT chest: Extensive bilateral pulmonary groundglass opacification 8/28>> Echo: EF 55-60% 9/3>> chest x-ray: Large right pneumothorax  COVID-19 medications: Steroids: 8/20>> Remdesivir: 8/20>> 8/24 Baricitinib: 8/20>> 8/26, 8/28>>  Antibiotics: None  Microbiology data: 8/27 >>blood culture: Negative  Procedures: 9/3>> chest tube insertion (PCCM-Dr. Tamala Julian)  Consults: None  DVT prophylaxis: Lovenox-change to BID dosing-as remains at significant risk of VTE    Subjective:   Chest tube placed earlier this morning given large right pneumothorax.  Some pain at the chest tube insertion site.   Assessment  & Plan :   Acute  Hypoxic Resp Failure due to Covid 19 Viral pneumonia: Continues to have severe hypoxemia-on 15 L of HFNC and NRB.  Unfortunately-now has a large pneumothorax requiring chest tube placement.  He has no signs of volume overload.  Continue steroids and baricitinib.  Continue to monitor closely  Fever: afebrile O2 requirements:  SpO2: 91 % O2 Flow Rate (L/min): 15 L/min FiO2 (%): 100 %   COVID-19 Labs: Recent Labs    03/15/20 0355 03/16/20 1239 03/17/20 0143  DDIMER 0.41 0.56* 0.46  FERRITIN  --  1,350* 1,170*  CRP 0.8 1.0* 1.5*  Component Value Date/Time   BNP 39.4 03/14/2020 0319    Recent Labs  Lab 02/15/2020 1613  PROCALCITON <0.10    Lab Results  Component Value Date   SARSCOV2NAA POSITIVE (A) 03/03/2020     Prone/Incentive Spirometry: encouraged  incentive spirometry use 3-4/hour.  Large right pneumothorax: Secondary to COVID-19-PCCM consulted-chest tube placed on 9/3.  Chest tube care deferred to PCCM.  HTN: BP stable-antihypertensives remain on hold.  Resume when able.  HLD: Continue statin/Zetia.  DM-2 (A1c 6.9 on 03/08/2020): CBG stable-continue SSI  Recent Labs    03/16/20 2055 03/17/20 0735 03/17/20 1209  GLUCAP 150* 101* 119*   History of diffuse large B-cell lymphoma:  in remission-follows with oncology at [place] is to resume usual outpatient follow-up post discharge.  OSA: Continue CPAP  Palliative care discussion: Has had several discussions with the patient-and spouse over the phone-both understand that patient has severe parenchymal injury to [id number] complicated by large pneumothorax.  Both understand his tenuous medical condition with potential for further decline and that he may not survive this hospitalization.  After extensive discussion-patient agreeable for DNR status-goals of care are to continue with full scope of treatment short of intubation/CPR.   Morbid Obesity: Estimated body mass index is 39.53 kg/m as calculated from  the following:   Height as of this encounter: 6' (1.829 m).   Weight as of this encounter: 132.2 kg.    ABG: No results found for: PHART, PCO2ART, PO2ART, HCO3, TCO2, ACIDBASEDEF, O2SAT  Vent Settings: N/A  Condition -  Guarded  Family Communication  :  Spouse Joaquim Lai [phone]) updated over the phone 9/3  Code Status :  DNR  Diet :  Diet Order            Diet Heart Room service appropriate? Yes; Fluid consistency: Thin  Diet effective now                  Disposition Plan  :   Status is: Inpatient  Remains inpatient appropriate because:Inpatient level of care appropriate due to severity of illness   Dispo: The patient is from: Home              Anticipated d/c is to: Home              Anticipated d/c date is: > 3 days              Patient currently is not medically stable to d/c.   Barriers to discharge: Hypoxia requiring O2 supplementation  Antimicorbials  :    Anti-infectives (From admission, onward)   None      Inpatient Medications  Scheduled Meds: . enoxaparin (LOVENOX) injection  70 mg Subcutaneous Q24H  . ezetimibe  10 mg Oral Daily  . gabapentin  300 mg Oral Q12H  . insulin aspart  0-15 Units Subcutaneous TID WC  . loratadine  10 mg Oral Daily  . methylPREDNISolone (SOLU-MEDROL) injection  60 mg Intravenous BID  . pantoprazole  40 mg Oral Daily  . rosuvastatin  40 mg Oral Daily  . senna-docusate  2 tablet Oral BID  . sodium chloride flush  10 mL Intracatheter Q8H  . venlafaxine XR  75 mg Oral Q breakfast   Continuous Infusions: PRN Meds:.acetaminophen, clonazePAM, oxyCODONE, polyethylene glycol, sodium chloride   Time Spent in minutes  35  See all Orders from today for further details   Oren Binet M.D on 03/17/2020 at 1:39 PM  To page go to [email or web address] - use  universal password  [facility] -  Office  [phone]    Objective:   Vitals:   03/17/20 1100 03/17/20 1200 03/17/20 1207 03/17/20 1300  BP: 122/79 (!)  119/92 (!) 119/92 115/78  Pulse: (!) 109 (!) 104  92  Resp: (!) 27 (!) 25  19  Temp: 97.9 F (36.6 C) 98.2 F (36.8 C) (!) 97.5 F (36.4 C) 97.7 F (36.5 C)  TempSrc: Axillary Axillary Axillary Axillary  SpO2: (!) 85% 96%  91%  Weight:      Height:        Wt Readings from Last 3 Encounters:  03/17/20 132.2 kg  03/03/20 136.1 kg  12/01/19 (!) 149.5 kg     Intake/Output Summary (Last 24 hours) at 03/17/2020 1339 Last data filed at 03/17/2020 1238 Gross per 24 hour  Intake 540 ml  Output 880 ml  Net -340 ml    Gen Exam:Alert awake-not in any distress HEENT:atraumatic, normocephalic Chest: B/L clear to auscultation anteriorly CVS:S1S2 regular Abdomen:soft non tender, non distended Extremities:no edema Neurology: Non focal Skin: no rash   Data Review:    CBC Recent Labs  Lab 03/11/20 0103 03/11/20 0103 03/12/20 0320 03/12/20 0320 03/13/20 0246 03/14/20 0319 03/15/20 0355 03/16/20 1239 03/17/20 0143  WBC 8.5   < > 9.7   < > 13.9* 11.2* 13.4* 19.3* 15.6*  HGB 13.2   < > 12.8*   < > 13.1 12.9* 13.4 15.9 14.4  HCT 39.9   < > 39.7   < > 40.8 40.3 40.7 47.7 44.2  PLT 324   < > 357   < > 369 331 339 372 272  MCV 89.5   < > 89.6   < > 88.9 89.0 89.8 88.2 87.9  MCH 29.6   < > 28.9   < > 28.5 28.5 29.6 29.4 28.6  MCHC 33.1   < > 32.2   < > 32.1 32.0 32.9 33.3 32.6  RDW 14.5   < > 14.3   < > 14.1 13.8 14.0 14.2 14.1  LYMPHSABS 0.1*  --  0.2*  --  0.3* 0.2* 0.2*  --   --   MONOABS 0.4  --  0.3  --  0.8 0.4 0.6  --   --   EOSABS 0.0  --  0.0  --  0.0 0.0 0.0  --   --   BASOSABS 0.0  --  0.0  --  0.0 0.0 0.0  --   --    < > = values in this interval not displayed.    Chemistries  Recent Labs  Lab 03/11/20 0103 03/12/20 0320 03/13/20 0246 03/14/20 0319 03/15/20 0355 03/16/20 1239 03/17/20 0143  NA 138   < > 140 138 136 138 137  K 4.2   < > 3.9 4.3 4.7 4.2 3.9  CL 101   < > 98 98 95* 93* 94*  CO2 25   < > 30 29 25 28  32  GLUCOSE 137*   < > 148* 156* 136* 139*  167*  BUN 19   < > 29* 25* 26* 29* 31*  CREATININE 0.92   < > 1.21 1.00 0.96 1.10 1.03  CALCIUM 8.4*   < > 8.8* 8.5* 8.3* 8.8* 8.6*  MG 2.4  --   --   --   --   --   --   AST 32   < > 47* 40 35 36 35  ALT 43   < > 62* 71* 60*  58* 52*  ALKPHOS 55   < > 63 60 58 72 66  BILITOT 1.0   < > 0.5 0.8 1.0 1.5* 1.3*   < > = values in this interval not displayed.   ------------------------------------------------------------------------------------------------------------------ No results for input(s): CHOL, HDL, LDLCALC, TRIG, CHOLHDL, LDLDIRECT in the last 72 hours.  Lab Results  Component Value Date   HGBA1C 6.9 (H) 03/08/2020   ------------------------------------------------------------------------------------------------------------------ No results for input(s): TSH, T4TOTAL, T3FREE, THYROIDAB in the last 72 hours.  Invalid input(s): FREET3 ------------------------------------------------------------------------------------------------------------------ Recent Labs    03/16/20 1239 03/17/20 0143  FERRITIN 1,350* 1,170*    Coagulation profile No results for input(s): INR, PROTIME in the last 168 hours.  Recent Labs    03/16/20 1239 03/17/20 0143  DDIMER 0.56* 0.46    Cardiac Enzymes No results for input(s): CKMB, TROPONINI, MYOGLOBIN in the last 168 hours.  Invalid input(s): CK ------------------------------------------------------------------------------------------------------------------    Component Value Date/Time   BNP 39.4 03/14/2020 0319    Micro Results Recent Results (from the past 240 hour(s))  Blood Culture (routine x 2)     Status: None   Collection Time: 02/13/2020  4:12 PM   Specimen: BLOOD  Result Value Ref Range Status   Specimen Description BLOOD RIGHT ANTECUBITAL  Final   Special Requests   Final    BOTTLES DRAWN AEROBIC AND ANAEROBIC Blood Culture results may not be optimal due to an inadequate volume of blood received in culture bottles    Culture   Final    NO GROWTH 5 DAYS Performed at [facility], [place] [address], [place], [place] [address]    Report Status 03/15/2020 FINAL  Final  Blood Culture (routine x 2)     Status: None   Collection Time: 02/26/2020  8:12 PM   Specimen: BLOOD LEFT HAND  Result Value Ref Range Status   Specimen Description BLOOD LEFT HAND  Final   Special Requests   Final    BOTTLES DRAWN AEROBIC AND ANAEROBIC Blood Culture adequate volume   Culture   Final    NO GROWTH 5 DAYS Performed at [facility], [place] [address] [place], [place] [address]    Report Status 03/15/2020 FINAL  Final    Radiology Reports DG Chest 1 View  Result Date: 03/17/2020 CLINICAL DATA:  Follow-up pneumothorax. EXAM: CHEST  1 VIEW COMPARISON:  Chest radiograph earlier this day and prior studies FINDINGS: A RIGHT thoracostomy tube is now noted with decrease in RIGHT pneumothorax, now small, approximately 15%. Diffuse bilateral airspace opacities are again noted. Cardiomegaly is unchanged. No other significant change identified. IMPRESSION: RIGHT thoracostomy tube placement with decreased RIGHT pneumothorax, now approximately 15%. Electronically Signed   By: Margarette Canada M.D.   On: 03/17/2020 09:36   CT CHEST WO CONTRAST  Result Date: 03/11/2020 CLINICAL DATA:  Pneumonia, unresolved. History of non-Hodgkin's lymphoma. Positive for coronavirus. EXAM: CT CHEST WITHOUT CONTRAST TECHNIQUE: Multidetector CT imaging of the chest was performed following the standard protocol without IV contrast. COMPARISON:  Chest radiograph 02/27/2020 FINDINGS: Cardiovascular: Normal heart size. No pericardial effusion. Aortic atherosclerosis. Coronary artery calcifications. Mediastinum/Nodes: No enlarged mediastinal or axillary lymph nodes. Thyroid gland, trachea, and esophagus demonstrate no significant findings. Lungs/Pleura: There is no pleural effusion. Extensive, bilateral pulmonary ground-glass opacification with patchy areas of nodular  consolidation compatible with atypical viral pneumonia. There is a subpleural bleb identified within the posterior left lung base measuring 5.6 cm, image 145/6. Upper Abdomen: Multiple left lobe of liver cyst noted measuring  up to 2.8 cm. No pain acute finding noted within the imaged portions of the upper abdomen. Musculoskeletal: No chest wall mass or suspicious bone lesions identified. IMPRESSION: 1. Extensive, bilateral pulmonary ground-glass opacification with patchy areas of nodular consolidation compatible with atypical viral pneumonia. 2. Coronary artery calcifications noted. 3. Aortic atherosclerosis. Aortic Atherosclerosis (ICD10-I70.0). Electronically Signed   By: Kerby Moors M.D.   On: 03/11/2020 11:37   DG Chest Port 1 View  Result Date: 03/13/2020 CLINICAL DATA:  Short of breath EXAM: PORTABLE CHEST 1 VIEW COMPARISON:  03/05/2020 FINDINGS: Progression of right lower lobe airspace disease. Cardiac silhouette upper normal. Negative for heart failure. Left lung clear. Probable underlying COPD IMPRESSION: COPD.  Progressive right lower lobe infiltrate. Electronically Signed   By: Franchot Gallo M.D.   On: 02/22/2020 16:17   DG Chest Port 1 View  Result Date: 03/05/2020 CLINICAL DATA:  COVID-19. EXAM: PORTABLE CHEST 1 VIEW COMPARISON:  March 03, 2020 FINDINGS: New hazy infiltrates are seen in the lungs, particularly the upper lobes. The cardiomediastinal silhouette is stable. No pneumothorax. No other acute abnormalities. IMPRESSION: New hazy infiltrates, particularly in the upper lobes, worrisome for developing COVID-19 pneumonia given history. Electronically Signed   By: Dorise Bullion III M.D   On: 03/05/2020 08:38   DG Chest Port 1 View  Result Date: 03/03/2020 CLINICAL DATA:  COVID-19 diagnosed 2 weeks ago, increasing shortness of breath, hypoxemia EXAM: PORTABLE CHEST 1 VIEW COMPARISON:  None. FINDINGS: Single frontal view of the chest demonstrates an unremarkable cardiac silhouette.  Diffuse interstitial prominence is seen, with faint ground-glass opacities within the mid and lower lung zones. No effusion or pneumothorax. No acute bony abnormality. IMPRESSION: 1. Interstitial prominence with bilateral ground-glass airspace disease. Differential includes multifocal atypical pneumonia versus edema. Electronically Signed   By: Randa Ngo M.D.   On: 03/03/2020 15:43   DG Chest Port 1V same Day  Result Date: 03/17/2020 CLINICAL DATA:  Shortness of breath. EXAM: PORTABLE CHEST 1 VIEW COMPARISON:  Chest CT 03/12/2019.  Chest x-ray 03/13/2020. FINDINGS: Large right pneumothorax is new in the interval. Diffuse interstitial and ground-glass airspace disease in the left lung is progressive since chest x-ray of 02/14/2020. The cardio pericardial silhouette is enlarged. The visualized bony structures of the thorax show no acute abnormality. Telemetry leads overlie the chest. IMPRESSION: New large right pneumothorax. Interval progression of diffuse interstitial and alveolar opacity in the left lung. I personally called the results of this study at 0829 hours to the patient's nurse Greta, who took the report for Dr. Sloan Leiter, who was currently rounding. Electronically Signed   By: Misty Stanley M.D.   On: 03/17/2020 09:07   ECHOCARDIOGRAM LIMITED  Result Date: 03/11/2020    ECHOCARDIOGRAM LIMITED REPORT   Patient Name:   Michael Wolf Date of Exam: 03/11/2020 Medical Rec #:  [id number]       Height:       72.0 in Accession #:    [id number]      Weight:       305.7 lb Date of Birth:  11-05-1955       BSA:          2.552 m Patient Age:    57 years        BP:           122/68 mmHg Patient Gender: M               HR:  85 bpm. Exam Location:  Inpatient Procedure: Limited Echo, Color Doppler and Cardiac Doppler Indications:   pulmonary hypertension 416.8  History:       Patient has no prior history of Echocardiogram examinations.                Covid.  Sonographer:   Johny Chess Referring       [id number] Lequita Halt Phys: IMPRESSIONS  1. Left ventricular ejection fraction, by estimation, is 55 to 60%. The left ventricle has normal function. The left ventricle has no regional wall motion abnormalities. Left ventricular diastolic parameters were normal.  2. Right ventricular systolic function is normal. The right ventricular size is mildly enlarged. Tricuspid regurgitation signal is inadequate for assessing PA pressure.  3. The aortic valve is tricuspid. Aortic valve regurgitation is not visualized. Mild aortic valve sclerosis is present, with no evidence of aortic valve stenosis.  4. The inferior vena cava is normal in size with greater than 50% respiratory variability, suggesting right atrial pressure of 3 mmHg. FINDINGS  Left Ventricle: Left ventricular ejection fraction, by estimation, is 55 to 60%. The left ventricle has normal function. The left ventricle has no regional wall motion abnormalities. The left ventricular internal cavity size was normal in size. There is  borderline left ventricular hypertrophy. Right Ventricle: The right ventricular size is mildly enlarged. No increase in right ventricular wall thickness. Right ventricular systolic function is normal. Tricuspid regurgitation signal is inadequate for assessing PA pressure. Pericardium: There is no evidence of pericardial effusion. Presence of pericardial fat pad. Tricuspid Valve: The tricuspid valve is grossly normal. Tricuspid valve regurgitation is trivial. Aortic Valve: The aortic valve is tricuspid. Aortic valve regurgitation is not visualized. Mild aortic valve sclerosis is present, with no evidence of aortic valve stenosis. Mild aortic valve annular calcification. Pulmonic Valve: The pulmonic valve was grossly normal. Pulmonic valve regurgitation is trivial. Venous: The inferior vena cava is normal in size with greater than 50% respiratory variability, suggesting right atrial pressure of 3 mmHg. IAS/Shunts: No atrial level shunt  detected by color flow Doppler. LEFT VENTRICLE PLAX 2D LVIDd:         5.00 cm  Diastology LVIDs:         3.50 cm  LV e' lateral:   13.90 cm/s LV PW:         1.10 cm  LV E/e' lateral: 5.1 LV IVS:        1.00 cm  LV e' medial:    12.60 cm/s LVOT diam:     2.30 cm  LV E/e' medial:  5.6 LV SV:         85 LV SV Index:   33 LVOT Area:     4.15 cm  IVC IVC diam: 1.90 cm LEFT ATRIUM         Index LA diam:    4.00 cm 1.57 cm/m  AORTIC VALVE LVOT Vmax:   106.00 cm/s LVOT Vmean:  68.700 cm/s LVOT VTI:    0.205 m MITRAL VALVE MV Area (PHT): 4.60 cm    SHUNTS MV Decel Time: 165 msec    Systemic VTI:  0.20 m MV E velocity: 70.30 cm/s  Systemic Diam: 2.30 cm MV A velocity: 90.80 cm/s MV E/A ratio:  0.77 Rozann Lesches MD Electronically signed by Rozann Lesches MD Signature Date/Time: 03/11/2020/4:40:56 PM    Final

## 2020-03-17 NOTE — Progress Notes (Signed)
   03/17/20 0910  Assess: MEWS Score  Temp 98.7 F (37.1 C)  BP 111/74  Pulse Rate (!) 108  ECG Heart Rate (!) 108  Resp (!) 22  SpO2 92 %  O2 Device HFNC;Non-rebreather Mask  O2 Flow Rate (L/min) 15 L/min  Assess: MEWS Score  MEWS Temp 0  MEWS Systolic 0  MEWS Pulse 1  MEWS RR 1  MEWS LOC 0  MEWS Score 2  MEWS Score Color Yellow  Assess: if the MEWS score is Yellow or Red  Were vital signs taken at a resting state? Yes  Focused Assessment No change from prior assessment  Early Detection of Sepsis Score *See Row Information* High  MEWS guidelines implemented *See Row Information* Yes  Treat  MEWS Interventions Escalated (See documentation below)  Pain Scale 0-10  Pain Score 0  Take Vital Signs  Increase Vital Sign Frequency  Yellow: Q 2hr X 2 then Q 4hr X 2, if remains yellow, continue Q 4hrs  Escalate  MEWS: Escalate Yellow: discuss with charge nurse/RN and consider discussing with provider and RRT  Notify: Charge Nurse/RN  Name of Charge Nurse/RN Notified Precious Gilding  Date Charge Nurse/RN Notified 03/17/20  Time Charge Nurse/RN Notified 0915  Notify: Provider  Provider Name/Title Dr. Doretha Imus  Date Provider Notified 03/17/20  Time Provider Notified [phone]  Notification Type Face-to-face  Notify: Rapid Response  Name of Rapid Response RN Notified n/a  Document  Progress note created (see row info) Yes  A chest tube was placed

## 2020-03-17 NOTE — Progress Notes (Signed)
   03/17/20 2253  Assess: MEWS Score  Temp 98 F (36.7 C)  BP 118/80  Pulse Rate (!) 138  ECG Heart Rate (!) 155  Resp (!) 23  SpO2 92 %  O2 Device HFNC;Non-rebreather Mask  O2 Flow Rate (L/min) 15 L/min  FiO2 (%) 100 %  Assess: MEWS Score  MEWS Temp 0  MEWS Systolic 0  MEWS Pulse 3  MEWS RR 1  MEWS LOC 0  MEWS Score 4  MEWS Score Color Red  Assess: if the MEWS score is Yellow or Red  Were vital signs taken at a resting state? Yes  Focused Assessment Change from prior assessment (see assessment flowsheet)  Early Detection of Sepsis Score *See Row Information* Low  MEWS guidelines implemented *See Row Information* Yes  Treat  MEWS Interventions Escalated (See documentation below)  Take Vital Signs  Increase Vital Sign Frequency  Red: Q 1hr X 4 then Q 4hr X 4, if remains red, continue Q 4hrs  Escalate  MEWS: Escalate Red: discuss with charge nurse/RN and provider, consider discussing with RRT  Notify: Charge Nurse/RN  Name of Charge Nurse/RN Notified Ilsa Iha, RN   Date Charge Nurse/RN Notified 03/17/20  Time Charge Nurse/RN Notified 2321  Notify: Provider  Provider Name/Title dr. Earley Favor  Date Provider Notified 03/17/20  Time Provider Notified 2311  Notification Type Page  Notification Reason Change in status (afib rvr new onset)  Response See new orders  Date of Provider Response 03/17/20  Time of Provider Response 2325  Document  Progress note created (see row info) Yes

## 2020-03-17 NOTE — Progress Notes (Signed)
   03/17/20 0506  Assess: MEWS Score  Temp 98.6 F (37 C)  BP 128/73  Pulse Rate 100  ECG Heart Rate 100  Resp (!) 22  SpO2 (!) 89 %  O2 Device HFNC;Non-rebreather Mask  O2 Flow Rate (L/min) 15 L/min  Assess: MEWS Score  MEWS Temp 0  MEWS Systolic 0  MEWS Pulse 0  MEWS RR 1  MEWS LOC 0  MEWS Score 1  MEWS Score Color Michael Wolf

## 2020-03-17 NOTE — Consult Note (Signed)
NAME:  Michael Wolf, MRN:  [id number], DOB:  October 31, 1955, LOS: 7 ADMISSION DATE:  02/20/2020, CONSULTATION DATE:  03/17/20 REFERRING MD:  Sloan Leiter, CHIEF COMPLAINT:  SOB   Brief History   64 year old man presenting with severe COVID pneumonia complicated by pneumothorax.  History of present illness   64 year old man with hx of metabolic syndrome, NHL in remission presenting with persistent hypoxemia after COVID diagnosis (8/20).  O2 requirements and breathing steaily worsening leading to CXR today which showed large R PTX.   PCCM consulted for chest tube management.  ROS as below.  Past Medical History   Past Medical History:  Diagnosis Date  . Cancer ([place])   . High cholesterol   . Hypertension   . Non Hodgkin's lymphoma ([place])   . Obesity   . Sleep apnea      Significant Hospital Events   8/20-8/26 initial COVID admission 8/27 readmission  Consults:  N/A  Procedures:  N/A  Significant Diagnostic Tests:  CXR large R PTX  Micro Data:  COVID +  Antimicrobials:  N/a   Interim history/subjective:  Consult.  Objective   Blood pressure 111/74, pulse (!) 108, temperature 98.7 F (37.1 C), temperature source Oral, resp. rate (!) 22, height 6' (1.829 m), weight 132.2 kg, SpO2 92 %.        Intake/Output Summary (Last 24 hours) at 03/17/2020 0916 Last data filed at 03/17/2020 0747 Gross per 24 hour  Intake 1110 ml  Output 2030 ml  Net -920 ml   Filed Weights   02/17/2020 1546 02/23/2020 2330 03/17/20 0506  Weight: (!) 144.5 kg (!) 138.7 kg 132.2 kg    Examination: General: ill appearing man lying in bed HENT: MMM, malampatti 4 Lungs: diminished R, clear left, occasional accessory muscle use Cardiovascular: tachycardic, ext warm Abdomen: protuberant, hypoactive BS Extremities: trace edema Neuro: moves all 4 ex to command Skin: no rashes  CRP/ferritin okay CBC/CMP look okay   Resolved Hospital Problem list   N/A  Assessment & Plan:  Severe COVID  Pneumonia Right sided pneumothorax due to above  - [place] - Keep to -20cmH2O suction - Okay to travel on waterseal - Remainder of care per primary, will follow with you  Best practice:  Diet: per primary Pain/Anxiety/Delirium protocol (if indicated): per primary VAP protocol (if indicated): N/A DVT prophylaxis: per primary GI prophylaxis: per primary Glucose control: per primary Mobility: per primary Code Status: per primary Family Communication: per primary Disposition: 5w  Labs   CBC: Recent Labs  Lab 03/11/20 0103 03/11/20 0103 03/12/20 0320 03/12/20 0320 03/13/20 0246 03/14/20 0319 03/15/20 0355 03/16/20 1239 03/17/20 0143  WBC 8.5   < > 9.7   < > 13.9* 11.2* 13.4* 19.3* 15.6*  NEUTROABS 7.8*  --  9.1*  --  12.7* 10.6* 12.6*  --   --   HGB 13.2   < > 12.8*   < > 13.1 12.9* 13.4 15.9 14.4  HCT 39.9   < > 39.7   < > 40.8 40.3 40.7 47.7 44.2  MCV 89.5   < > 89.6   < > 88.9 89.0 89.8 88.2 87.9  PLT 324   < > 357   < > 369 331 339 372 272   < > = values in this interval not displayed.    Basic Metabolic Panel: Recent Labs  Lab 03/11/20 0103 03/12/20 0320 03/13/20 0246 03/14/20 0319 03/15/20 0355 03/16/20 1239 03/17/20 0143  NA 138   < > 140 138  136 138 137  K 4.2   < > 3.9 4.3 4.7 4.2 3.9  CL 101   < > 98 98 95* 93* 94*  CO2 25   < > 30 29 25 28  32  GLUCOSE 137*   < > 148* 156* 136* 139* 167*  BUN 19   < > 29* 25* 26* 29* 31*  CREATININE 0.92   < > 1.21 1.00 0.96 1.10 1.03  CALCIUM 8.4*   < > 8.8* 8.5* 8.3* 8.8* 8.6*  MG 2.4  --   --   --   --   --   --   PHOS 4.1  --   --   --   --   --   --    < > = values in this interval not displayed.   GFR: Estimated Creatinine Clearance: 100.5 mL/min (by C-G formula based on SCr of 1.03 mg/dL). Recent Labs  Lab 02/13/2020 1612 03/11/2020 1613 03/09/2020 1955 03/11/20 0103 03/14/20 0319 03/15/20 0355 03/16/20 1239 03/17/20 0143  PROCALCITON  --  <0.10  --   --   --   --   --   --   WBC  --  9.5  --    < >  11.2* 13.4* 19.3* 15.6*  LATICACIDVEN 2.6*  --  1.3  --   --   --   --   --    < > = values in this interval not displayed.    Liver Function Tests: Recent Labs  Lab 03/13/20 0246 03/14/20 0319 03/15/20 0355 03/16/20 1239 03/17/20 0143  AST 47* 40 35 36 35  ALT 62* 71* 60* 58* 52*  ALKPHOS 63 60 58 72 66  BILITOT 0.5 0.8 1.0 1.5* 1.3*  PROT 6.0* 5.5* 5.5* 6.6 5.6*  ALBUMIN 3.2* 2.9* 2.9* 3.5 3.1*   No results for input(s): LIPASE, AMYLASE in the last 168 hours. No results for input(s): AMMONIA in the last 168 hours.  ABG No results found for: PHART, PCO2ART, PO2ART, HCO3, TCO2, ACIDBASEDEF, O2SAT   Coagulation Profile: No results for input(s): INR, PROTIME in the last 168 hours.  Cardiac Enzymes: No results for input(s): CKTOTAL, CKMB, CKMBINDEX, TROPONINI in the last 168 hours.  HbA1C: Hgb A1c MFr Bld  Date/Time Value Ref Range Status  03/08/2020 05:00 AM 6.9 (H) 4.8 - 5.6 % Final    Comment:    (NOTE) Pre diabetes:          5.7%-6.4%  Diabetes:              >6.4%  Glycemic control for   <7.0% adults with diabetes     CBG: Recent Labs  Lab 03/16/20 0749 03/16/20 1203 03/16/20 1559 03/16/20 2055 03/17/20 0735  GLUCAP 129* 139* 175* 150* 101*    Review of Systems:    Positive Symptoms in bold:  Constitutional fevers, chills, weight loss, fatigue, anorexia, malaise  Eyes decreased vision, double vision, eye irritation  Ears, Nose, Mouth, Throat sore throat, trouble swallowing, sinus congestion  Cardiovascular chest pain, paroxysmal nocturnal dyspnea, lower ext edema, palpitations   Respiratory SOB, cough, DOE, hemoptysis, wheezing  Gastrointestinal nausea, vomiting, diarrhea  Genitourinary burning with urination, trouble urinating  Musculoskeletal joint aches, joint swelling, back pain  Integumentary  rashes, skin lesions  Neurological focal weakness, focal numbness, trouble speaking, headaches  Psychiatric depression, anxiety, confusion   Endocrine polyuria, polydipsia, cold intolerance, heat intolerance  Hematologic abnormal bruising, abnormal bleeding, unexplained nose bleeds  Allergic/Immunologic recurrent infections, hives, swollen  lymph nodes     Past Medical History  He,  has a past medical history of Cancer ([place]), High cholesterol, Hypertension, Non Hodgkin's lymphoma ([place]), Obesity, and Sleep apnea.   Surgical History    Past Surgical History:  Procedure Laterality Date  . HIP SURGERY       Social History   reports that he has never smoked. He has never used smokeless tobacco. He reports that he does not drink alcohol and does not use drugs.   Family History   His family history is not on file.   Allergies Allergies  Allergen Reactions  . Sulfur Other (See Comments)    Thirsty, doesn't tolerate well per spouse     Home Medications  Prior to Admission medications   Medication Sig Start Date End Date Taking? Authorizing Provider  cetirizine (ZYRTEC) 10 MG tablet Take 10 mg by mouth daily.   Yes [provider]  clonazePAM (KLONOPIN) 1 MG tablet Take 0.5-1 mg by mouth at bedtime. 01/08/20  Yes [provider]  desvenlafaxine (PRISTIQ) 50 MG 24 hr tablet Take 50 mg by mouth daily.   Yes [provider]  dexamethasone (DECADRON) 6 MG tablet Take 1 tablet (6 mg total) by mouth daily. 02/25/2020  Yes Elgergawy, Silver Huguenin, MD  ezetimibe (ZETIA) 10 MG tablet Take 10 mg by mouth daily.   Yes [provider]  gabapentin (NEURONTIN) 300 MG capsule Take 300 mg by mouth 3 times/day as needed-between meals & bedtime (pain).  12/17/19  Yes [provider]  metFORMIN (GLUCOPHAGE) 500 MG tablet Take 1 tablet (500 mg total) by mouth 2 (two) times daily with a meal. 03/09/20 03/09/21 Yes Elgergawy, Silver Huguenin, MD  pantoprazole (PROTONIX) 40 MG tablet Take 1 tablet (40 mg total) by mouth daily. 03/09/20 04/08/20 Yes Elgergawy, Silver Huguenin, MD  rosuvastatin (CRESTOR) 40 MG tablet Take 40 mg by  mouth daily.   Yes [provider]  triamterene-hydrochlorothiazide (MAXZIDE-25) 37.5-25 MG tablet Take 1 tablet by mouth daily. PLEASE START ON 8/29 Patient taking differently: Take 1 tablet by mouth daily.  03/12/20  Yes Elgergawy, Silver Huguenin, MD

## 2020-03-18 ENCOUNTER — Inpatient Hospital Stay (HOSPITAL_COMMUNITY): Payer: Medicare Other

## 2020-03-18 LAB — GLUCOSE, CAPILLARY
Glucose-Capillary: 160 mg/dL — ABNORMAL HIGH (ref 70–99)
Glucose-Capillary: 123 mg/dL — ABNORMAL HIGH (ref 70–99)
Glucose-Capillary: 124 mg/dL — ABNORMAL HIGH (ref 70–99)
Glucose-Capillary: 149 mg/dL — ABNORMAL HIGH (ref 70–99)
Glucose-Capillary: 145 mg/dL — ABNORMAL HIGH (ref 70–99)

## 2020-03-18 LAB — COMPREHENSIVE METABOLIC PANEL
ALT: 46 U/L — ABNORMAL HIGH (ref 0–44)
AST: 28 U/L (ref 15–41)
Albumin: 2.6 g/dL — ABNORMAL LOW (ref 3.5–5.0)
Alkaline Phosphatase: 63 U/L (ref 38–126)
Anion gap: 9 (ref 5–15)
BUN: 23 mg/dL (ref 8–23)
CO2: 33 mmol/L — ABNORMAL HIGH (ref 22–32)
Calcium: 8.2 mg/dL — ABNORMAL LOW (ref 8.9–10.3)
Chloride: 97 mmol/L — ABNORMAL LOW (ref 98–111)
Creatinine, Ser: 0.96 mg/dL (ref 0.61–1.24)
GFR calc Af Amer: >60 mL/min (ref >60)
GFR calc non Af Amer: >60 mL/min (ref >60)
Glucose, Bld: 153 mg/dL — ABNORMAL HIGH (ref 70–99)
Potassium: 3.9 mmol/L (ref 3.5–5.1)
Sodium: 139 mmol/L (ref 135–145)
Total Bilirubin: 1.2 mg/dL (ref 0.3–1.2)
Total Protein: 5.2 g/dL — ABNORMAL LOW (ref 6.5–8.1)

## 2020-03-18 LAB — CBC
HCT: 41.9 % (ref 39.0–52.0)
Hemoglobin: 13.5 g/dL (ref 13.0–17.0)
MCH: 28.8 pg (ref 26.0–34.0)
MCHC: 32.2 g/dL (ref 30.0–36.0)
MCV: 89.5 fL (ref 80.0–100.0)
Platelets: 246 10*3/uL (ref 150–400)
RBC: 4.68 MIL/uL (ref 4.22–5.81)
RDW: 14.2 % (ref 11.5–15.5)
WBC: 14.1 10*3/uL — ABNORMAL HIGH (ref 4.0–10.5)
nRBC: 0 % (ref 0.0–0.2)

## 2020-03-18 LAB — C-REACTIVE PROTEIN: CRP: 4 mg/dL — ABNORMAL HIGH (ref <1.0)

## 2020-03-18 LAB — FERRITIN: Ferritin: 1236 ng/mL — ABNORMAL HIGH (ref 24–336)

## 2020-03-18 LAB — D-DIMER, QUANTITATIVE: D-Dimer, Quant: 0.69 ug/mL-FEU — ABNORMAL HIGH (ref 0.00–0.50)

## 2020-03-18 MED ORDER — SODIUM CHLORIDE 0.9 % IV BOLUS
500.0000 mL | Freq: Once | INTRAVENOUS | Status: AC
  Administered 2020-03-18: 500 mL via INTRAVENOUS

## 2020-03-18 MED ORDER — METOPROLOL TARTRATE 5 MG/5ML IV SOLN
5.0000 mg | INTRAVENOUS | Status: DC | PRN
  Administered 2020-03-22 (×2): 5 mg via INTRAVENOUS
  Filled 2020-03-18 (×2): qty 5

## 2020-03-18 MED ORDER — ACETAMINOPHEN 325 MG PO TABS
650.0000 mg | ORAL_TABLET | Freq: Four times a day (QID) | ORAL | Status: DC | PRN
  Administered 2020-03-24: 650 mg via ORAL
  Filled 2020-03-18 (×2): qty 2

## 2020-03-18 MED ORDER — MORPHINE SULFATE (PF) 2 MG/ML IV SOLN: 2.0000 mg | Freq: Once | INTRAVENOUS | Status: DC

## 2020-03-18 MED ORDER — ENOXAPARIN SODIUM 150 MG/ML ~~LOC~~ SOLN
130.0000 mg | Freq: Two times a day (BID) | SUBCUTANEOUS | Status: DC
  Administered 2020-03-18 – 2020-03-19 (×3): 130 mg via SUBCUTANEOUS
  Filled 2020-03-18 (×4): qty 0.86

## 2020-03-18 MED ORDER — METOPROLOL TARTRATE 25 MG PO TABS
25.0000 mg | ORAL_TABLET | Freq: Three times a day (TID) | ORAL | Status: DC
  Administered 2020-03-18 (×2): 25 mg via ORAL
  Filled 2020-03-18 (×2): qty 1

## 2020-03-18 MED ORDER — DIGOXIN 0.25 MG/ML IJ SOLN
0.5000 mg | Freq: Once | INTRAMUSCULAR | Status: AC
  Administered 2020-03-18: 0.5 mg via INTRAVENOUS
  Filled 2020-03-18: qty 2

## 2020-03-18 MED ORDER — ENOXAPARIN SODIUM 60 MG/0.6ML ~~LOC~~ SOLN
60.0000 mg | Freq: Once | SUBCUTANEOUS | Status: AC
  Administered 2020-03-18: 60 mg via SUBCUTANEOUS
  Filled 2020-03-18: qty 0.6

## 2020-03-18 MED ORDER — METOPROLOL TARTRATE 25 MG PO TABS
25.0000 mg | ORAL_TABLET | Freq: Two times a day (BID) | ORAL | Status: DC
  Administered 2020-03-18 – 2020-03-24 (×11): 25 mg via ORAL
  Filled 2020-03-18 (×12): qty 1

## 2020-03-18 MED ORDER — DILTIAZEM HCL 25 MG/5ML IV SOLN
15.0000 mg | Freq: Once | INTRAVENOUS | Status: DC
  Filled 2020-03-18: qty 5

## 2020-03-18 MED ORDER — METOPROLOL TARTRATE 5 MG/5ML IV SOLN
5.0000 mg | Freq: Once | INTRAVENOUS | Status: AC
  Administered 2020-03-18: 5 mg via INTRAVENOUS
  Filled 2020-03-18: qty 5

## 2020-03-18 MED ORDER — DILTIAZEM HCL 25 MG/5ML IV SOLN
10.0000 mg | Freq: Once | INTRAVENOUS | Status: AC
  Administered 2020-03-18: 10 mg via INTRAVENOUS
  Filled 2020-03-18: qty 5

## 2020-03-18 NOTE — Progress Notes (Signed)
[facility] Progress Note Patient Name: Michael Wolf DOB: 1956-04-08 MRN: [id number]   Date of Service  03/18/2020  HPI/Events of Note  PCCM following patient for management of chest tube. Asked to review CXR for R chest tube placement and funtion. R lung appears to be expanded and no pneumothorax is appreciated. Sat = 100% and RR = 21.   eICU Interventions  Continue current chest tube management.      Intervention Category Major Interventions: Hypoxemia - evaluation and management  Michael Wolf 03/18/2020, 1:59 AM

## 2020-03-18 NOTE — Progress Notes (Signed)
PROGRESS NOTE                                                                                                                                                                                                             Patient Demographics:    Michael Wolf, is a 64 y.o. male, DOB - [birth year], HWE:[id number]  Outpatient Primary MD for the patient is [name], MD   Admit date - 03/08/2020   LOS - 8  Chief Complaint  Patient presents with  . Shortness of Breath       Brief Narrative: Patient is a 64 y.o. male with PMHx of HTN, HLD, DM-2, non-Hodgkin's lymphoma in remission, OSA on CPAP, morbid obesity-recent breakthrough COVID-19 PNA with hypoxemia-discharged home on 8/26 with home O2-presented back to the ED on 8/27 with significant worsening of hypoxemia.  Subsequently admitted to the hospitalist service-hospital course further complicated by right-sided pneumothorax requiring placement of chest tube, and A. fib with RVR.    COVID-19 vaccinated status: Vaccinated  Significant Events: 8/20-8/26>> hospitalized for hypoxia due to COVID-19-discharged on home O2-2 L 8/27>> readmitted for worsening hypoxemia-now requiring 15 L of HFNC. 9/3>> chest x-ray with large right pneumothorax-PCCM consulted for chest tube placement 9/3-9/4>>. fib with RVR-spontaneously converted to sinus rhythm on 9/4  Significant studies: 8/27>>Chest x-ray: COPD, progressive right lower lobe infiltrate. 8/28>> CT chest: Extensive bilateral pulmonary groundglass opacification 8/28>> Echo: EF 55-60% 9/3>> chest x-ray: Large right pneumothorax  COVID-19 medications: Steroids: 8/20>> Remdesivir: 8/20>> 8/24 Baricitinib: 8/20>> 8/26, 8/28>>  Antibiotics: None  Microbiology data: 8/27 >>blood culture: Negative  Procedures: 9/3>> chest tube insertion (PCCM-Dr. Tamala Julian)  Consults: None  DVT prophylaxis: Therapeutic Lovenox     Subjective:   Gets short of breath with minimal activity-hypoxia has worsened somewhat-on heated high flow morning.  Was in atrial fibrillation with RVR last night.  Subsequently converted to sinus rhythm earlier this morning.   Assessment  & Plan :   Acute Hypoxic Resp Failure due to Covid 19 Viral pneumonia: Some worsening of hypoxemia-now on heated high flow-no signs of volume overload-continue steroids and baricitinib.  Fever: afebrile O2 requirements:  SpO2: 91 % O2 Flow Rate (L/min): 20 L/min (+ 15L NRB) FiO2 (%): 100 %   COVID-19 Labs: Recent Labs    03/16/20 1239 03/17/20 0143 03/18/20 0505  DDIMER 0.56* 0.46 0.69*  FERRITIN 1,350* 1,170* 1,236*  CRP 1.0* 1.5* 4.0*       Component Value Date/Time   BNP 39.4 03/14/2020 0319    No results for input(s): PROCALCITON in the last 168 hours.  Lab Results  Component Value Date   SARSCOV2NAA POSITIVE (A) 03/03/2020     Prone/Incentive Spirometry: encouraged  incentive spirometry use 3-4/hour.  Large right pneumothorax: Secondary to COVID-19-PCCM consulted-chest tube placed on 9/3.  Chest tube care deferred to PCCM.  A. fib with RVR: Occurred on 9/3 evening-was in RVR this morning-given digoxin x1-subsequently started on oral beta-blocker.  Converted to therapeutic Lovenox given CHA2DS2-VASc score of around 2-3.  Recent echo with preserved EF.  Thankfully subsequently converted back to sinus rhythm earlier this morning.  Continue telemetry monitoring.  HTN: BP stable-now on beta-blocker for rate control for A. fib.   HLD: Continue statin/Zetia.  DM-2 (A1c 6.9 on 03/08/2020): CBG stable-continue SSI  Recent Labs    03/18/20 0413 03/18/20 0758 03/18/20 1200  GLUCAP 160* 123* 124*   History of diffuse large B-cell lymphoma:  in remission-follows with oncology at [facility] is to resume usual outpatient follow-up post discharge.  OSA: Continue CPAP  Palliative care discussion: Remains very tenuous-has severe  parenchymal lung injury due to COVID-19-complicated by right-sided pneumothorax requiring chest tube.  Have had extensive discussion with both patient and spouse over the phone over the past few days-goals of care to continue with full scope of medical treatment but he is a DNR.  Both patient and spouse aware that if he is declined significantly-then apart from hospice care we really do not have any other options.  Morbid Obesity: Estimated body mass index is 39.23 kg/m as calculated from the following:   Height as of this encounter: 6' (1.829 m).   Weight as of this encounter: 131.2 kg.    ABG: No results found for: PHART, PCO2ART, PO2ART, HCO3, TCO2, ACIDBASEDEF, O2SAT  Vent Settings: N/A  Condition -  Guarded  Family Communication  :  Spouse Joaquim Lai [phone]) updated over the phone 9/4  Code Status :  DNR  Diet :  Diet Order            Diet Heart Room service appropriate? Yes; Fluid consistency: Thin  Diet effective now                  Disposition Plan  :   Status is: Inpatient  Remains inpatient appropriate because:Inpatient level of care appropriate due to severity of illness   Dispo: The patient is from: Home              Anticipated d/c is to: Home              Anticipated d/c date is: > 3 days              Patient currently is not medically stable to d/c.   Barriers to discharge: Hypoxia requiring O2 supplementation  Antimicorbials  :    Anti-infectives (From admission, onward)   None      Inpatient Medications  Scheduled Meds: . enoxaparin (LOVENOX) injection  130 mg Subcutaneous Q12H  . ezetimibe  10 mg Oral Daily  . gabapentin  300 mg Oral Q12H  . insulin aspart  0-15 Units Subcutaneous TID WC  . loratadine  10 mg Oral Daily  . methylPREDNISolone (SOLU-MEDROL) injection  60 mg Intravenous BID  . metoprolol tartrate  25 mg Oral TID  .  morphine injection  2 mg  Intravenous Once  . pantoprazole  40 mg Oral Daily  . rosuvastatin  40 mg Oral  Daily  . senna-docusate  2 tablet Oral BID  . sodium chloride flush  10 mL Intracatheter Q8H  . venlafaxine XR  75 mg Oral Q breakfast   Continuous Infusions: PRN Meds:.acetaminophen, clonazePAM, metoprolol tartrate, oxyCODONE, polyethylene glycol, sodium chloride   Time Spent in minutes  35  See all Orders from today for further details   Oren Binet M.D on 03/18/2020 at 2:09 PM  To page go to [email or web address] - use universal password  [facility] -  Office  [phone]    Objective:   Vitals:   03/18/20 0655 03/18/20 0815 03/18/20 0817 03/18/20 1202  BP: 98/63 103/64 103/64 (!) 100/52  Pulse: 88 (!) 102 97 93  Resp: 20 (!) 32 (!) 33 (!) 22  Temp: 97.9 F (36.6 C)  97.9 F (36.6 C) 98 F (36.7 C)  TempSrc: Axillary  Axillary Oral  SpO2: 96%  94% 91%  Weight:      Height:        Wt Readings from Last 3 Encounters:  03/18/20 131.2 kg  03/03/20 136.1 kg  12/01/19 (!) 149.5 kg     Intake/Output Summary (Last 24 hours) at 03/18/2020 1409 Last data filed at 03/18/2020 1220 Gross per 24 hour  Intake 505.46 ml  Output 2050 ml  Net -1544.54 ml    Gen Exam:Alert awake-not in any distress HEENT:atraumatic, normocephalic Chest: B/L clear to auscultation anteriorly CVS:S1S2 regular Abdomen:soft non tender, non distended Extremities:no edema Neurology: Non focal Skin: no rash   Data Review:    CBC Recent Labs  Lab 03/12/20 0320 03/12/20 0320 03/13/20 0246 03/13/20 0246 03/14/20 0319 03/15/20 0355 03/16/20 1239 03/17/20 0143 03/18/20 0505  WBC 9.7   < > 13.9*   < > 11.2* 13.4* 19.3* 15.6* 14.1*  HGB 12.8*   < > 13.1   < > 12.9* 13.4 15.9 14.4 13.5  HCT 39.7   < > 40.8   < > 40.3 40.7 47.7 44.2 41.9  PLT 357   < > 369   < > 331 339 372 272 246  MCV 89.6   < > 88.9   < > 89.0 89.8 88.2 87.9 89.5  MCH 28.9   < > 28.5   < > 28.5 29.6 29.4 28.6 28.8  MCHC 32.2   < > 32.1   < > 32.0 32.9 33.3 32.6 32.2  RDW 14.3   < > 14.1   < > 13.8 14.0 14.2 14.1  14.2  LYMPHSABS 0.2*  --  0.3*  --  0.2* 0.2*  --   --   --   MONOABS 0.3  --  0.8  --  0.4 0.6  --   --   --   EOSABS 0.0  --  0.0  --  0.0 0.0  --   --   --   BASOSABS 0.0  --  0.0  --  0.0 0.0  --   --   --    < > = values in this interval not displayed.    Chemistries  Recent Labs  Lab 03/14/20 0319 03/15/20 0355 03/16/20 1239 03/17/20 0143 03/18/20 0505  NA 138 136 138 137 139  K 4.3 4.7 4.2 3.9 3.9  CL 98 95* 93* 94* 97*  CO2 29 25 28  32 33*  GLUCOSE 156* 136* 139* 167* 153*  BUN 25* 26* 29* 31* 23  CREATININE 1.00 0.96  1.10 1.03 0.96  CALCIUM 8.5* 8.3* 8.8* 8.6* 8.2*  AST 40 35 36 35 28  ALT 71* 60* 58* 52* 46*  ALKPHOS 60 58 72 66 63  BILITOT 0.8 1.0 1.5* 1.3* 1.2   ------------------------------------------------------------------------------------------------------------------ No results for input(s): CHOL, HDL, LDLCALC, TRIG, CHOLHDL, LDLDIRECT in the last 72 hours.  Lab Results  Component Value Date   HGBA1C 6.9 (H) 03/08/2020   ------------------------------------------------------------------------------------------------------------------ No results for input(s): TSH, T4TOTAL, T3FREE, THYROIDAB in the last 72 hours.  Invalid input(s): FREET3 ------------------------------------------------------------------------------------------------------------------ Recent Labs    03/17/20 0143 03/18/20 0505  FERRITIN 1,170* 1,236*    Coagulation profile No results for input(s): INR, PROTIME in the last 168 hours.  Recent Labs    03/17/20 0143 03/18/20 0505  DDIMER 0.46 0.69*    Cardiac Enzymes No results for input(s): CKMB, TROPONINI, MYOGLOBIN in the last 168 hours.  Invalid input(s): CK ------------------------------------------------------------------------------------------------------------------    Component Value Date/Time   BNP 39.4 03/14/2020 0319    Micro Results Recent Results (from the past 240 hour(s))  Blood Culture (routine x 2)      Status: None   Collection Time: 02/15/2020  4:12 PM   Specimen: BLOOD  Result Value Ref Range Status   Specimen Description BLOOD RIGHT ANTECUBITAL  Final   Special Requests   Final    BOTTLES DRAWN AEROBIC AND ANAEROBIC Blood Culture results may not be optimal due to an inadequate volume of blood received in culture bottles   Culture   Final    NO GROWTH 5 DAYS Performed at [facility], [place] [address] [place], [place] [address]    Report Status 03/15/2020 FINAL  Final  Blood Culture (routine x 2)     Status: None   Collection Time: 03/08/2020  8:12 PM   Specimen: BLOOD LEFT HAND  Result Value Ref Range Status   Specimen Description BLOOD LEFT HAND  Final   Special Requests   Final    BOTTLES DRAWN AEROBIC AND ANAEROBIC Blood Culture adequate volume   Culture   Final    NO GROWTH 5 DAYS Performed at [facility], [place] [address], [place], [place] [address]    Report Status 03/15/2020 FINAL  Final    Radiology Reports DG Chest 1 View  Result Date: 03/17/2020 CLINICAL DATA:  Follow-up pneumothorax. EXAM: CHEST  1 VIEW COMPARISON:  Chest radiograph earlier this day and prior studies FINDINGS: A RIGHT thoracostomy tube is now noted with decrease in RIGHT pneumothorax, now small, approximately 15%. Diffuse bilateral airspace opacities are again noted. Cardiomegaly is unchanged. No other significant change identified. IMPRESSION: RIGHT thoracostomy tube placement with decreased RIGHT pneumothorax, now approximately 15%. Electronically Signed   By: Margarette Canada M.D.   On: 03/17/2020 09:36   CT CHEST WO CONTRAST  Result Date: 03/11/2020 CLINICAL DATA:  Pneumonia, unresolved. History of non-Hodgkin's lymphoma. Positive for coronavirus. EXAM: CT CHEST WITHOUT CONTRAST TECHNIQUE: Multidetector CT imaging of the chest was performed following the standard protocol without IV contrast. COMPARISON:  Chest radiograph 02/14/2020 FINDINGS: Cardiovascular: Normal heart size. No pericardial  effusion. Aortic atherosclerosis. Coronary artery calcifications. Mediastinum/Nodes: No enlarged mediastinal or axillary lymph nodes. Thyroid gland, trachea, and esophagus demonstrate no significant findings. Lungs/Pleura: There is no pleural effusion. Extensive, bilateral pulmonary ground-glass opacification with patchy areas of nodular consolidation compatible with atypical viral pneumonia. There is a subpleural bleb identified within the posterior left lung base measuring 5.6 cm, image 145/6. Upper Abdomen: Multiple left lobe of liver cyst noted  measuring up to 2.8 cm. No pain acute finding noted within the imaged portions of the upper abdomen. Musculoskeletal: No chest wall mass or suspicious bone lesions identified. IMPRESSION: 1. Extensive, bilateral pulmonary ground-glass opacification with patchy areas of nodular consolidation compatible with atypical viral pneumonia. 2. Coronary artery calcifications noted. 3. Aortic atherosclerosis. Aortic Atherosclerosis (ICD10-I70.0). Electronically Signed   By: Kerby Moors M.D.   On: 03/11/2020 11:37   DG Chest Port 1 View  Result Date: 03/18/2020 CLINICAL DATA:  COVID positive. Follow-up exam for respiratory opacities and right chest tube/pneumothorax. EXAM: PORTABLE CHEST 1 VIEW COMPARISON:  03/18/2020 at 12:05 a.m., and earlier studies. FINDINGS: Minimal right apical pneumothorax is stable from the earlier exam. Right chest tube is unchanged in position. Bilateral hazy airspace lung opacities are unchanged. No new lung abnormalities. IMPRESSION: 1. Minimal right apical pneumothorax is unchanged from the most recent prior exam. 2. Stable bilateral airspace lung opacities. Stable right-sided chest tube. Electronically Signed   By: Lajean Manes M.D.   On: 03/18/2020 10:34   DG CHEST PORT 1 VIEW  Result Date: 03/18/2020 CLINICAL DATA:  Chest tube EXAM: PORTABLE CHEST 1 VIEW COMPARISON:  03/17/2020 FINDINGS: Right chest tube in place. Small residual right apical  pneumothorax, decreased since prior study. Cardiomegaly. Diffuse airspace disease throughout the right lung. Patchy airspace disease on the left. IMPRESSION: Decreasing size of the right pneumothorax. Now small residual right apical pneumothorax remains. Bilateral airspace disease, right greater than left. Electronically Signed   By: Rolm Baptise M.D.   On: 03/18/2020 02:08   DG Chest Port 1 View  Result Date: 03/01/2020 CLINICAL DATA:  Short of breath EXAM: PORTABLE CHEST 1 VIEW COMPARISON:  03/05/2020 FINDINGS: Progression of right lower lobe airspace disease. Cardiac silhouette upper normal. Negative for heart failure. Left lung clear. Probable underlying COPD IMPRESSION: COPD.  Progressive right lower lobe infiltrate. Electronically Signed   By: Franchot Gallo M.D.   On: 02/26/2020 16:17   DG Chest Port 1 View  Result Date: 03/05/2020 CLINICAL DATA:  COVID-19. EXAM: PORTABLE CHEST 1 VIEW COMPARISON:  March 03, 2020 FINDINGS: New hazy infiltrates are seen in the lungs, particularly the upper lobes. The cardiomediastinal silhouette is stable. No pneumothorax. No other acute abnormalities. IMPRESSION: New hazy infiltrates, particularly in the upper lobes, worrisome for developing COVID-19 pneumonia given history. Electronically Signed   By: Dorise Bullion III M.D   On: 03/05/2020 08:38   DG Chest Port 1 View  Result Date: 03/03/2020 CLINICAL DATA:  COVID-19 diagnosed 2 weeks ago, increasing shortness of breath, hypoxemia EXAM: PORTABLE CHEST 1 VIEW COMPARISON:  None. FINDINGS: Single frontal view of the chest demonstrates an unremarkable cardiac silhouette. Diffuse interstitial prominence is seen, with faint ground-glass opacities within the mid and lower lung zones. No effusion or pneumothorax. No acute bony abnormality. IMPRESSION: 1. Interstitial prominence with bilateral ground-glass airspace disease. Differential includes multifocal atypical pneumonia versus edema. Electronically Signed   By:  Randa Ngo M.D.   On: 03/03/2020 15:43   DG Chest Port 1V same Day  Result Date: 03/17/2020 CLINICAL DATA:  Shortness of breath. EXAM: PORTABLE CHEST 1 VIEW COMPARISON:  Chest CT 03/12/2019.  Chest x-ray 02/15/2020. FINDINGS: Large right pneumothorax is new in the interval. Diffuse interstitial and ground-glass airspace disease in the left lung is progressive since chest x-ray of 02/20/2020. The cardio pericardial silhouette is enlarged. The visualized bony structures of the thorax show no acute abnormality. Telemetry leads overlie the chest. IMPRESSION: New large right pneumothorax. Interval  progression of diffuse interstitial and alveolar opacity in the left lung. I personally called the results of this study at 0829 hours to the patient's nurse Greta, who took the report for Dr. Sloan Leiter, who was currently rounding. Electronically Signed   By: Misty Stanley M.D.   On: 03/17/2020 09:07   ECHOCARDIOGRAM LIMITED  Result Date: 03/11/2020    ECHOCARDIOGRAM LIMITED REPORT   Patient Name:   CAVEN PERINE Date of Exam: 03/11/2020 Medical Rec #:  [id number]       Height:       72.0 in Accession #:    [id number]      Weight:       305.7 lb Date of Birth:  [birth year]       BSA:          2.552 m Patient Age:    10 years        BP:           122/68 mmHg Patient Gender: M               HR:           85 bpm. Exam Location:  Inpatient Procedure: Limited Echo, Color Doppler and Cardiac Doppler Indications:   pulmonary hypertension 416.8  History:       Patient has no prior history of Echocardiogram examinations.                Covid.  Sonographer:   Johny Chess Referring      [id number] Lequita Halt Phys: IMPRESSIONS  1. Left ventricular ejection fraction, by estimation, is 55 to 60%. The left ventricle has normal function. The left ventricle has no regional wall motion abnormalities. Left ventricular diastolic parameters were normal.  2. Right ventricular systolic function is normal. The right ventricular size is  mildly enlarged. Tricuspid regurgitation signal is inadequate for assessing PA pressure.  3. The aortic valve is tricuspid. Aortic valve regurgitation is not visualized. Mild aortic valve sclerosis is present, with no evidence of aortic valve stenosis.  4. The inferior vena cava is normal in size with greater than 50% respiratory variability, suggesting right atrial pressure of 3 mmHg. FINDINGS  Left Ventricle: Left ventricular ejection fraction, by estimation, is 55 to 60%. The left ventricle has normal function. The left ventricle has no regional wall motion abnormalities. The left ventricular internal cavity size was normal in size. There is  borderline left ventricular hypertrophy. Right Ventricle: The right ventricular size is mildly enlarged. No increase in right ventricular wall thickness. Right ventricular systolic function is normal. Tricuspid regurgitation signal is inadequate for assessing PA pressure. Pericardium: There is no evidence of pericardial effusion. Presence of pericardial fat pad. Tricuspid Valve: The tricuspid valve is grossly normal. Tricuspid valve regurgitation is trivial. Aortic Valve: The aortic valve is tricuspid. Aortic valve regurgitation is not visualized. Mild aortic valve sclerosis is present, with no evidence of aortic valve stenosis. Mild aortic valve annular calcification. Pulmonic Valve: The pulmonic valve was grossly normal. Pulmonic valve regurgitation is trivial. Venous: The inferior vena cava is normal in size with greater than 50% respiratory variability, suggesting right atrial pressure of 3 mmHg. IAS/Shunts: No atrial level shunt detected by color flow Doppler. LEFT VENTRICLE PLAX 2D LVIDd:         5.00 cm  Diastology LVIDs:         3.50 cm  LV e' lateral:   13.90 cm/s LV PW:         1.10 cm  LV E/e' lateral: 5.1 LV IVS:        1.00 cm  LV e' medial:    12.60 cm/s LVOT diam:     2.30 cm  LV E/e' medial:  5.6 LV SV:         85 LV SV Index:   33 LVOT Area:     4.15 cm   IVC IVC diam: 1.90 cm LEFT ATRIUM         Index LA diam:    4.00 cm 1.57 cm/m  AORTIC VALVE LVOT Vmax:   106.00 cm/s LVOT Vmean:  68.700 cm/s LVOT VTI:    0.205 m MITRAL VALVE MV Area (PHT): 4.60 cm    SHUNTS MV Decel Time: 165 msec    Systemic VTI:  0.20 m MV E velocity: 70.30 cm/s  Systemic Diam: 2.30 cm MV A velocity: 90.80 cm/s MV E/A ratio:  0.77 Rozann Lesches MD Electronically signed by Rozann Lesches MD Signature Date/Time: 03/11/2020/4:40:56 PM    Final

## 2020-03-18 NOTE — Progress Notes (Signed)
Pt has remained on heated high flow o2 between 30-40L plus NRB throughout this shift with oxygen saturation hovering around 90%. Pt is producing phlegm and sputum with strong cough and tends to increase oxygenation for a time after clearing sputum. Encouraged to use flutter valve as often as possible.

## 2020-03-18 NOTE — Progress Notes (Signed)
ANTICOAGULATION CONSULT NOTE - Initial Consult  Pharmacy Consult for Lovenox Indication: atrial fibrillation  Allergies  Allergen Reactions  . Sulfur Other (See Comments)    Thirsty, doesn't tolerate well per spouse    Patient Measurements: Height: 6' (182.9 cm) Weight: 131.2 kg (289 lb 3.9 oz) IBW/kg (Calculated) : 77.6  Vital Signs: Temp: 97.9 F (36.6 C) (09/04 0655) Temp Source: Axillary (09/04 0655) BP: 98/63 (09/04 0655) Pulse Rate: 88 (09/04 0655)  Labs: Recent Labs    03/16/20 1239 03/16/20 1239 03/17/20 0143 03/18/20 0505  HGB 15.9   < > 14.4 13.5  HCT 47.7  --  44.2 41.9  PLT 372  --  272 246  CREATININE 1.10  --  1.03 0.96   < > = values in this interval not displayed.    Estimated Creatinine Clearance: 107.4 mL/min (by C-G formula based on SCr of 0.96 mg/dL).   Medical History: Past Medical History:  Diagnosis Date  . Cancer ([place])   . High cholesterol   . Hypertension   . Non Hodgkin's lymphoma ([place])   . Obesity   . Sleep apnea     Medications:  Scheduled:  . enoxaparin (LOVENOX) injection  70 mg Subcutaneous Q12H  . ezetimibe  10 mg Oral Daily  . gabapentin  300 mg Oral Q12H  . insulin aspart  0-15 Units Subcutaneous TID WC  . loratadine  10 mg Oral Daily  . methylPREDNISolone (SOLU-MEDROL) injection  60 mg Intravenous BID  . metoprolol tartrate  25 mg Oral TID  .  morphine injection  2 mg Intravenous Once  . pantoprazole  40 mg Oral Daily  . rosuvastatin  40 mg Oral Daily  . senna-docusate  2 tablet Oral BID  . sodium chloride flush  10 mL Intracatheter Q8H  . venlafaxine XR  75 mg Oral Q breakfast    Assessment: 64 years of age male with COVID and large right pneumothorax status post chest tube placement on 9/3 AM now with atrial fibrillation. Pharmacy consulted to increase Lovenox therapy to full dose. Currently on Lovenox 0.5mg /kg SQ every 12 hours. Will increase to 1mg /kg SQ every 12 hours for full dose in setting of atrial  fibrillation.   H/H is slowly trending down but still within normal limits. Platelets are stable and within normal limits. No bleeding has been documented. SCr has been stable ~1.   Goal of Therapy:  Anti-Xa level 0.6-1 units/ml 4hrs after LMWH dose given as appropriate Monitor platelets by anticoagulation protocol: Yes   Plan:  Increase Lovenox to 1mg /kg (130mg ) SQ every 12 hours.  Will give an extra dose of 60mg  this AM as patient has already received 70mg  dose at 5AM.  Will plan to check level at steady state to confirm dosing.   Sloan Leiter, PharmD, BCPS, BCCCP Clinical Pharmacist Please refer to [facility] for [place] numbers 03/18/2020,7:27 AM

## 2020-03-19 ENCOUNTER — Inpatient Hospital Stay (HOSPITAL_COMMUNITY): Payer: Medicare Other

## 2020-03-19 DIAGNOSIS — Z9689 Presence of other specified functional implants: Secondary | ICD-10-CM

## 2020-03-19 LAB — CBC
HCT: 42.8 % (ref 39.0–52.0)
Hemoglobin: 13.8 g/dL (ref 13.0–17.0)
MCH: 29.3 pg (ref 26.0–34.0)
MCHC: 32.2 g/dL (ref 30.0–36.0)
MCV: 90.9 fL (ref 80.0–100.0)
Platelets: 217 10*3/uL (ref 150–400)
RBC: 4.71 MIL/uL (ref 4.22–5.81)
RDW: 14.4 % (ref 11.5–15.5)
WBC: 14.4 10*3/uL — ABNORMAL HIGH (ref 4.0–10.5)
nRBC: 0 % (ref 0.0–0.2)

## 2020-03-19 LAB — COMPREHENSIVE METABOLIC PANEL
ALT: 49 U/L — ABNORMAL HIGH (ref 0–44)
AST: 29 U/L (ref 15–41)
Albumin: 2.5 g/dL — ABNORMAL LOW (ref 3.5–5.0)
Alkaline Phosphatase: 64 U/L (ref 38–126)
Anion gap: 10 (ref 5–15)
BUN: 23 mg/dL (ref 8–23)
CO2: 32 mmol/L (ref 22–32)
Calcium: 8.3 mg/dL — ABNORMAL LOW (ref 8.9–10.3)
Chloride: 99 mmol/L (ref 98–111)
Creatinine, Ser: 0.87 mg/dL (ref 0.61–1.24)
GFR calc Af Amer: >60 mL/min (ref >60)
GFR calc non Af Amer: >60 mL/min (ref >60)
Glucose, Bld: 153 mg/dL — ABNORMAL HIGH (ref 70–99)
Potassium: 4 mmol/L (ref 3.5–5.1)
Sodium: 141 mmol/L (ref 135–145)
Total Bilirubin: 1 mg/dL (ref 0.3–1.2)
Total Protein: 5.3 g/dL — ABNORMAL LOW (ref 6.5–8.1)

## 2020-03-19 LAB — GLUCOSE, CAPILLARY
Glucose-Capillary: 140 mg/dL — ABNORMAL HIGH (ref 70–99)
Glucose-Capillary: 127 mg/dL — ABNORMAL HIGH (ref 70–99)
Glucose-Capillary: 164 mg/dL — ABNORMAL HIGH (ref 70–99)
Glucose-Capillary: 103 mg/dL — ABNORMAL HIGH (ref 70–99)

## 2020-03-19 LAB — HEPARIN ANTI-XA: Heparin LMW: 1.85 IU/mL

## 2020-03-19 LAB — FERRITIN: Ferritin: 1316 ng/mL — ABNORMAL HIGH (ref 24–336)

## 2020-03-19 LAB — C-REACTIVE PROTEIN: CRP: 4.8 mg/dL — ABNORMAL HIGH (ref <1.0)

## 2020-03-19 LAB — T4, FREE: Free T4: 1.41 ng/dL — ABNORMAL HIGH (ref 0.61–1.12)

## 2020-03-19 LAB — D-DIMER, QUANTITATIVE: D-Dimer, Quant: 0.62 ug/mL-FEU — ABNORMAL HIGH (ref 0.00–0.50)

## 2020-03-19 LAB — TSH: TSH: 0.221 u[IU]/mL — ABNORMAL LOW (ref 0.350–4.500)

## 2020-03-19 MED ORDER — OXYMETAZOLINE HCL 0.05 % NA SOLN
2.0000 | Freq: Two times a day (BID) | NASAL | Status: AC
  Administered 2020-03-19 – 2020-03-21 (×6): 2 via NASAL
  Filled 2020-03-19: qty 30

## 2020-03-19 MED ORDER — FLUTICASONE PROPIONATE 50 MCG/ACT NA SUSP
2.0000 | Freq: Every day | NASAL | Status: DC
  Administered 2020-03-19 – 2020-03-24 (×6): 2 via NASAL
  Filled 2020-03-19: qty 16

## 2020-03-19 MED ORDER — SALINE SPRAY 0.65 % NA SOLN: 1.0000 | NASAL | Status: DC | PRN

## 2020-03-19 MED ORDER — METHYLPREDNISOLONE SODIUM SUCC 40 MG IJ SOLR
40.0000 mg | Freq: Two times a day (BID) | INTRAMUSCULAR | Status: DC
  Administered 2020-03-19 – 2020-03-20 (×2): 40 mg via INTRAVENOUS
  Filled 2020-03-19 (×2): qty 1

## 2020-03-19 NOTE — Progress Notes (Signed)
Small,i intermittent air leak in chamber 1 observed in patients chest tube. Pt not in distress. VSS. On 25L HHFNC + 15L NRB saturating in low 90s. Dr. Sloan Leiter informed. Will continue to monitor.

## 2020-03-19 NOTE — Progress Notes (Signed)
PROGRESS NOTE                                                                                                                                                                                                             Patient Demographics:    Michael Wolf, is a 64 y.o. male, DOB - Jul 04, 1956, NLG:[id number]  Outpatient Primary MD for the patient is [name], MD   Admit date - 03/09/2020   LOS - 9  Chief Complaint  Patient presents with  . Shortness of Breath       Brief Narrative: Patient is a 64 y.o. male with PMHx of HTN, HLD, DM-2, non-Hodgkin's lymphoma in remission, OSA on CPAP, morbid obesity-recent breakthrough COVID-19 PNA with hypoxemia-discharged home on 8/26 with home O2-presented back to the ED on 8/27 with significant worsening of hypoxemia.  Subsequently admitted to the hospitalist service-hospital course further complicated by right-sided pneumothorax requiring placement of chest tube, and A. fib with RVR.    COVID-19 vaccinated status: Vaccinated  Significant Events: 8/20-8/26>> hospitalized for hypoxia due to COVID-19-discharged on home O2-2 L 8/27>> readmitted for worsening hypoxemia-now requiring 15 L of HFNC. 9/3>> chest x-ray with large right pneumothorax-PCCM consulted for chest tube placement 9/3-9/4>> atrial fib with RVR-spontaneously converted to sinus rhythm on 9/4  Significant studies: 8/27>>Chest x-ray: COPD, progressive right lower lobe infiltrate. 8/28>> CT chest: Extensive bilateral pulmonary groundglass opacification 8/28>> Echo: EF 55-60% 9/3>> chest x-ray: Large right pneumothorax 9/5>> chest x-ray: Right apical pneumothorax has decreased in size, stable right chest tube-no change in bilateral airspace disease  COVID-19 medications: Steroids: 8/20>> Remdesivir: 8/20>> 8/24 Baricitinib: 8/20>> 8/26, 8/28>>9/3  Antibiotics: None  Microbiology data: 8/27 >>blood  culture: Negative  Procedures: 9/3>> chest tube insertion (PCCM-Dr. Tamala Julian)  Consults: None  DVT prophylaxis: Therapeutic Lovenox    Subjective:   Stable overnight-remains in sinus rhythm.  On heated high flow.  No major issues-he actually feels better because he was able to sleep some last night.   Assessment  & Plan :   Acute Hypoxic Resp Failure due to Covid 19 Viral pneumonia: Severe hypoxia remains unchanged-on heated high flow-no signs of volume overload-remains on steroids-which has been tapered down slightly.   Chest x-ray continues to show showed significant bilateral pulmonary infiltrates.  Fever: afebrile O2 requirements:  SpO2: 91 % O2 Flow Rate (L/min): 30  L/min FiO2 (%): 100 %   COVID-19 Labs: Recent Labs    03/17/20 0143 03/18/20 0505 03/19/20 0254  DDIMER 0.46 0.69* 0.62*  FERRITIN 1,170* 1,236* 1,316*  CRP 1.5* 4.0* 4.8*       Component Value Date/Time   BNP 39.4 03/14/2020 0319    No results for input(s): PROCALCITON in the last 168 hours.  Lab Results  Component Value Date   SARSCOV2NAA POSITIVE (A) 03/03/2020     Prone/Incentive Spirometry: encouraged  incentive spirometry use 3-4/hour.  Large right pneumothorax: Secondary to COVID-19-PCCM consulted-chest tube placed on 9/3.  Chest tube care deferred to PCCM.  A. fib with RVR: Occurred on 9/3 evening--managed with beta-blocker and 1 dose of digoxin-spontaneously converted to sinus rhythm on 9/4-remains in sinus rhythm on 9/5.  On therapeutic Lovenox given  CHA2DS2-VASc score of around 2-3.  Recent echo with preserved EF.   HTN: BP stable-now on beta-blocker for rate control for A. fib.   HLD: Continue statin/Zetia.  DM-2 (A1c 6.9 on 03/08/2020): CBG stable-continue SSI  Recent Labs    03/18/20 2117 03/19/20 0733 03/19/20 1204  GLUCAP 145* 140* 127*   History of diffuse large B-cell lymphoma:  in remission-follows with oncology at [facility] is to resume usual outpatient follow-up  post discharge.  OSA: Continue CPAP  Palliative care discussion: Remains very tenuous-has severe parenchymal lung injury due to COVID-19-complicated by right-sided pneumothorax requiring chest tube.  Have had extensive discussion with both patient and spouse over the phone over the past few days-goals of care to continue with full scope of medical treatment but he is a DNR.  Both patient and spouse aware that if he is declines significantly-then apart from hospice care we really do not have any other options.  Morbid Obesity: Estimated body mass index is 39.68 kg/m as calculated from the following:   Height as of this encounter: 6' (1.829 m).   Weight as of this encounter: 132.7 kg.    ABG: No results found for: PHART, PCO2ART, PO2ART, HCO3, TCO2, ACIDBASEDEF, O2SAT  Vent Settings: N/A  Condition -  Guarded  Family Communication  :  Spouse Joaquim Lai [phone]) updated over the phone 9/5  Code Status :  DNR  Diet :  Diet Order            Diet Heart Room service appropriate? Yes; Fluid consistency: Thin  Diet effective now                  Disposition Plan  :   Status is: Inpatient  Remains inpatient appropriate because:Inpatient level of care appropriate due to severity of illness   Dispo: The patient is from: Home              Anticipated d/c is to: Home              Anticipated d/c date is: > 3 days              Patient currently is not medically stable to d/c.   Barriers to discharge: Hypoxia requiring O2 supplementation  Antimicorbials  :    Anti-infectives (From admission, onward)   None      Inpatient Medications  Scheduled Meds: . enoxaparin (LOVENOX) injection  130 mg Subcutaneous Q12H  . ezetimibe  10 mg Oral Daily  . fluticasone  2 spray Each Nare Daily  . gabapentin  300 mg Oral Q12H  . insulin aspart  0-15 Units Subcutaneous TID WC  . loratadine  10 mg  Oral Daily  . methylPREDNISolone (SOLU-MEDROL) injection  60 mg Intravenous BID  .  metoprolol tartrate  25 mg Oral BID  .  morphine injection  2 mg Intravenous Once  . oxymetazoline  2 spray Each Nare BID  . pantoprazole  40 mg Oral Daily  . rosuvastatin  40 mg Oral Daily  . senna-docusate  2 tablet Oral BID  . sodium chloride flush  10 mL Intracatheter Q8H  . venlafaxine XR  75 mg Oral Q breakfast   Continuous Infusions: PRN Meds:.acetaminophen, clonazePAM, metoprolol tartrate, oxyCODONE, polyethylene glycol, sodium chloride   Time Spent in minutes  35  See all Orders from today for further details   Oren Binet M.D on 03/19/2020 at 1:08 PM  To page go to [email or web address] - use universal password  [facility] -  Office  [phone]    Objective:   Vitals:   03/19/20 0400 03/19/20 0500 03/19/20 0801 03/19/20 1205  BP: 107/71  121/70 120/64  Pulse: 73  82 86  Resp: 17  (!) 22 (!) 21  Temp: (!) 97.1 F (36.2 C)  97.8 F (36.6 C) 98.2 F (36.8 C)  TempSrc: Axillary  Axillary Oral  SpO2: 98%  (!) 87% 91%  Weight:  132.7 kg    Height:        Wt Readings from Last 3 Encounters:  03/19/20 132.7 kg  03/03/20 136.1 kg  12/01/19 (!) 149.5 kg     Intake/Output Summary (Last 24 hours) at 03/19/2020 1308 Last data filed at 03/19/2020 0943 Gross per 24 hour  Intake 480 ml  Output 1000 ml  Net -520 ml    Gen Exam:Alert awake-not in any distress HEENT:atraumatic, normocephalic Chest: B/L clear to auscultation anteriorly CVS:S1S2 regular Abdomen:soft non tender, non distended Extremities:no edema Neurology: Non focal Skin: no rash   Data Review:    CBC Recent Labs  Lab 03/13/20 0246 03/13/20 0246 03/14/20 0319 03/14/20 0319 03/15/20 0355 03/16/20 1239 03/17/20 0143 03/18/20 0505 03/19/20 0254  WBC 13.9*   < > 11.2*   < > 13.4* 19.3* 15.6* 14.1* 14.4*  HGB 13.1   < > 12.9*   < > 13.4 15.9 14.4 13.5 13.8  HCT 40.8   < > 40.3   < > 40.7 47.7 44.2 41.9 42.8  PLT 369   < > 331   < > 339 372 272 246 217  MCV 88.9   < > 89.0   < > 89.8  88.2 87.9 89.5 90.9  MCH 28.5   < > 28.5   < > 29.6 29.4 28.6 28.8 29.3  MCHC 32.1   < > 32.0   < > 32.9 33.3 32.6 32.2 32.2  RDW 14.1   < > 13.8   < > 14.0 14.2 14.1 14.2 14.4  LYMPHSABS 0.3*  --  0.2*  --  0.2*  --   --   --   --   MONOABS 0.8  --  0.4  --  0.6  --   --   --   --   EOSABS 0.0  --  0.0  --  0.0  --   --   --   --   BASOSABS 0.0  --  0.0  --  0.0  --   --   --   --    < > = values in this interval not displayed.    [place]  Lab 03/15/20 0355 03/16/20 1239 03/17/20 0143 03/18/20 0505 03/19/20 0254  NA 136 138 137 139 141  K 4.7 4.2 3.9 3.9 4.0  CL 95* 93* 94* 97* 99  CO2 25 28 32 33* 32  GLUCOSE 136* 139* 167* 153* 153*  BUN 26* 29* 31* 23 23  CREATININE 0.96 1.10 1.03 0.96 0.87  CALCIUM 8.3* 8.8* 8.6* 8.2* 8.3*  AST 35 36 35 28 29  ALT 60* 58* 52* 46* 49*  ALKPHOS 58 72 66 63 64  BILITOT 1.0 1.5* 1.3* 1.2 1.0   ------------------------------------------------------------------------------------------------------------------ No results for input(s): CHOL, HDL, LDLCALC, TRIG, CHOLHDL, LDLDIRECT in the last 72 hours.  Lab Results  Component Value Date   HGBA1C 6.9 (H) 03/08/2020   ------------------------------------------------------------------------------------------------------------------ Recent Labs    03/19/20 0254  TSH 0.221*   ------------------------------------------------------------------------------------------------------------------ Recent Labs    03/18/20 0505 03/19/20 0254  FERRITIN 1,236* 1,316*    Coagulation profile No results for input(s): INR, PROTIME in the last 168 hours.  Recent Labs    03/18/20 0505 03/19/20 0254  DDIMER 0.69* 0.62*    Cardiac Enzymes No results for input(s): CKMB, TROPONINI, MYOGLOBIN in the last 168 hours.  Invalid input(s): CK ------------------------------------------------------------------------------------------------------------------    Component Value Date/Time    BNP 39.4 03/14/2020 0319    Micro Results Recent Results (from the past 240 hour(s))  Blood Culture (routine x 2)     Status: None   Collection Time: 03/12/2020  4:12 PM   Specimen: BLOOD  Result Value Ref Range Status   Specimen Description BLOOD RIGHT ANTECUBITAL  Final   Special Requests   Final    BOTTLES DRAWN AEROBIC AND ANAEROBIC Blood Culture results may not be optimal due to an inadequate volume of blood received in culture bottles   Culture   Final    NO GROWTH 5 DAYS Performed at [facility], [place] [address], [place], [place] [address]    Report Status 03/15/2020 FINAL  Final  Blood Culture (routine x 2)     Status: None   Collection Time: 02/24/2020  8:12 PM   Specimen: BLOOD LEFT HAND  Result Value Ref Range Status   Specimen Description BLOOD LEFT HAND  Final   Special Requests   Final    BOTTLES DRAWN AEROBIC AND ANAEROBIC Blood Culture adequate volume   Culture   Final    NO GROWTH 5 DAYS Performed at [facility], [place] [address], [place], [place] [address]    Report Status 03/15/2020 FINAL  Final    Radiology Reports DG Chest 1 View  Result Date: 03/17/2020 CLINICAL DATA:  Follow-up pneumothorax. EXAM: CHEST  1 VIEW COMPARISON:  Chest radiograph earlier this day and prior studies FINDINGS: A RIGHT thoracostomy tube is now noted with decrease in RIGHT pneumothorax, now small, approximately 15%. Diffuse bilateral airspace opacities are again noted. Cardiomegaly is unchanged. No other significant change identified. IMPRESSION: RIGHT thoracostomy tube placement with decreased RIGHT pneumothorax, now approximately 15%. Electronically Signed   By: Margarette Canada M.D.   On: 03/17/2020 09:36   CT CHEST WO CONTRAST  Result Date: 03/11/2020 CLINICAL DATA:  Pneumonia, unresolved. History of non-Hodgkin's lymphoma. Positive for coronavirus. EXAM: CT CHEST WITHOUT CONTRAST TECHNIQUE: Multidetector CT imaging of the chest was performed following the standard protocol  without IV contrast. COMPARISON:  Chest radiograph 02/29/2020 FINDINGS: Cardiovascular: Normal heart size. No pericardial effusion. Aortic atherosclerosis. Coronary artery calcifications. Mediastinum/Nodes: No enlarged mediastinal or axillary lymph nodes. Thyroid gland, trachea, and esophagus demonstrate no significant findings. Lungs/Pleura: There is no pleural effusion. Extensive, bilateral  pulmonary ground-glass opacification with patchy areas of nodular consolidation compatible with atypical viral pneumonia. There is a subpleural bleb identified within the posterior left lung base measuring 5.6 cm, image 145/6. Upper Abdomen: Multiple left lobe of liver cyst noted measuring up to 2.8 cm. No pain acute finding noted within the imaged portions of the upper abdomen. Musculoskeletal: No chest wall mass or suspicious bone lesions identified. IMPRESSION: 1. Extensive, bilateral pulmonary ground-glass opacification with patchy areas of nodular consolidation compatible with atypical viral pneumonia. 2. Coronary artery calcifications noted. 3. Aortic atherosclerosis. Aortic Atherosclerosis (ICD10-I70.0). Electronically Signed   By: Kerby Moors M.D.   On: 03/11/2020 11:37   DG Chest Port 1 View  Result Date: 03/18/2020 CLINICAL DATA:  COVID positive. Follow-up exam for respiratory opacities and right chest tube/pneumothorax. EXAM: PORTABLE CHEST 1 VIEW COMPARISON:  03/18/2020 at 12:05 a.m., and earlier studies. FINDINGS: Minimal right apical pneumothorax is stable from the earlier exam. Right chest tube is unchanged in position. Bilateral hazy airspace lung opacities are unchanged. No new lung abnormalities. IMPRESSION: 1. Minimal right apical pneumothorax is unchanged from the most recent prior exam. 2. Stable bilateral airspace lung opacities. Stable right-sided chest tube. Electronically Signed   By: Lajean Manes M.D.   On: 03/18/2020 10:34   DG CHEST PORT 1 VIEW  Result Date: 03/18/2020 CLINICAL DATA:  Chest  tube EXAM: PORTABLE CHEST 1 VIEW COMPARISON:  03/17/2020 FINDINGS: Right chest tube in place. Small residual right apical pneumothorax, decreased since prior study. Cardiomegaly. Diffuse airspace disease throughout the right lung. Patchy airspace disease on the left. IMPRESSION: Decreasing size of the right pneumothorax. Now small residual right apical pneumothorax remains. Bilateral airspace disease, right greater than left. Electronically Signed   By: Rolm Baptise M.D.   On: 03/18/2020 02:08   DG Chest Port 1 View  Result Date: 03/11/2020 CLINICAL DATA:  Short of breath EXAM: PORTABLE CHEST 1 VIEW COMPARISON:  03/05/2020 FINDINGS: Progression of right lower lobe airspace disease. Cardiac silhouette upper normal. Negative for heart failure. Left lung clear. Probable underlying COPD IMPRESSION: COPD.  Progressive right lower lobe infiltrate. Electronically Signed   By: Franchot Gallo M.D.   On: 02/24/2020 16:17   DG Chest Port 1 View  Result Date: 03/05/2020 CLINICAL DATA:  COVID-19. EXAM: PORTABLE CHEST 1 VIEW COMPARISON:  March 03, 2020 FINDINGS: New hazy infiltrates are seen in the lungs, particularly the upper lobes. The cardiomediastinal silhouette is stable. No pneumothorax. No other acute abnormalities. IMPRESSION: New hazy infiltrates, particularly in the upper lobes, worrisome for developing COVID-19 pneumonia given history. Electronically Signed   By: Dorise Bullion III M.D   On: 03/05/2020 08:38   DG Chest Port 1 View  Result Date: 03/03/2020 CLINICAL DATA:  COVID-19 diagnosed 2 weeks ago, increasing shortness of breath, hypoxemia EXAM: PORTABLE CHEST 1 VIEW COMPARISON:  None. FINDINGS: Single frontal view of the chest demonstrates an unremarkable cardiac silhouette. Diffuse interstitial prominence is seen, with faint ground-glass opacities within the mid and lower lung zones. No effusion or pneumothorax. No acute bony abnormality. IMPRESSION: 1. Interstitial prominence with bilateral  ground-glass airspace disease. Differential includes multifocal atypical pneumonia versus edema. Electronically Signed   By: Randa Ngo M.D.   On: 03/03/2020 15:43   DG Chest Port 1V same Day  Result Date: 03/19/2020 CLINICAL DATA:  COVID-19 infection.  Follow-up exam. EXAM: PORTABLE CHEST 1 VIEW COMPARISON:  03/18/2020 and earlier studies. FINDINGS: Tiny right apical pneumothorax has decreased in size from the previous exam. Right chest  tube is stable, lying along right lateral mid hemithorax. Interstitial and hazy airspace lung opacities are unchanged from the previous day's exam. No new lung abnormalities. IMPRESSION: 1. Right apical pneumothorax has decreased in size, only a tiny pneumothorax persisting at the apex. Stable right chest tube. 2. No change in the bilateral airspace lung opacities consistent with multifocal pneumonia. Electronically Signed   By: Lajean Manes M.D.   On: 03/19/2020 08:46   DG Chest Port 1V same Day  Result Date: 03/17/2020 CLINICAL DATA:  Shortness of breath. EXAM: PORTABLE CHEST 1 VIEW COMPARISON:  Chest CT 03/12/2019.  Chest x-ray 03/12/2020. FINDINGS: Large right pneumothorax is new in the interval. Diffuse interstitial and ground-glass airspace disease in the left lung is progressive since chest x-ray of 03/04/2020. The cardio pericardial silhouette is enlarged. The visualized bony structures of the thorax show no acute abnormality. Telemetry leads overlie the chest. IMPRESSION: New large right pneumothorax. Interval progression of diffuse interstitial and alveolar opacity in the left lung. I personally called the results of this study at 0829 hours to the patient's nurse Greta, who took the report for Dr. Sloan Leiter, who was currently rounding. Electronically Signed   By: Misty Stanley M.D.   On: 03/17/2020 09:07   ECHOCARDIOGRAM LIMITED  Result Date: 03/11/2020    ECHOCARDIOGRAM LIMITED REPORT   Patient Name:   TREV BOLEY Date of Exam: 03/11/2020 Medical Rec #:   [id number]       Height:       72.0 in Accession #:    [id number]      Weight:       305.7 lb Date of Birth:  [birth year]       BSA:          2.552 m Patient Age:    32 years        BP:           122/68 mmHg Patient Gender: M               HR:           85 bpm. Exam Location:  Inpatient Procedure: Limited Echo, Color Doppler and Cardiac Doppler Indications:   pulmonary hypertension 416.8  History:       Patient has no prior history of Echocardiogram examinations.                Covid.  Sonographer:   Johny Chess Referring      [id number] Lequita Halt Phys: IMPRESSIONS  1. Left ventricular ejection fraction, by estimation, is 55 to 60%. The left ventricle has normal function. The left ventricle has no regional wall motion abnormalities. Left ventricular diastolic parameters were normal.  2. Right ventricular systolic function is normal. The right ventricular size is mildly enlarged. Tricuspid regurgitation signal is inadequate for assessing PA pressure.  3. The aortic valve is tricuspid. Aortic valve regurgitation is not visualized. Mild aortic valve sclerosis is present, with no evidence of aortic valve stenosis.  4. The inferior vena cava is normal in size with greater than 50% respiratory variability, suggesting right atrial pressure of 3 mmHg. FINDINGS  Left Ventricle: Left ventricular ejection fraction, by estimation, is 55 to 60%. The left ventricle has normal function. The left ventricle has no regional wall motion abnormalities. The left ventricular internal cavity size was normal in size. There is  borderline left ventricular hypertrophy. Right Ventricle: The right ventricular size is mildly enlarged. No increase in right ventricular wall thickness. Right ventricular systolic function is  normal. Tricuspid regurgitation signal is inadequate for assessing PA pressure. Pericardium: There is no evidence of pericardial effusion. Presence of pericardial fat pad. Tricuspid Valve: The tricuspid valve is grossly  normal. Tricuspid valve regurgitation is trivial. Aortic Valve: The aortic valve is tricuspid. Aortic valve regurgitation is not visualized. Mild aortic valve sclerosis is present, with no evidence of aortic valve stenosis. Mild aortic valve annular calcification. Pulmonic Valve: The pulmonic valve was grossly normal. Pulmonic valve regurgitation is trivial. Venous: The inferior vena cava is normal in size with greater than 50% respiratory variability, suggesting right atrial pressure of 3 mmHg. IAS/Shunts: No atrial level shunt detected by color flow Doppler. LEFT VENTRICLE PLAX 2D LVIDd:         5.00 cm  Diastology LVIDs:         3.50 cm  LV e' lateral:   13.90 cm/s LV PW:         1.10 cm  LV E/e' lateral: 5.1 LV IVS:        1.00 cm  LV e' medial:    12.60 cm/s LVOT diam:     2.30 cm  LV E/e' medial:  5.6 LV SV:         85 LV SV Index:   33 LVOT Area:     4.15 cm  IVC IVC diam: 1.90 cm LEFT ATRIUM         Index LA diam:    4.00 cm 1.57 cm/m  AORTIC VALVE LVOT Vmax:   106.00 cm/s LVOT Vmean:  68.700 cm/s LVOT VTI:    0.205 m MITRAL VALVE MV Area (PHT): 4.60 cm    SHUNTS MV Decel Time: 165 msec    Systemic VTI:  0.20 m MV E velocity: 70.30 cm/s  Systemic Diam: 2.30 cm MV A velocity: 90.80 cm/s MV E/A ratio:  0.77 Rozann Lesches MD Electronically signed by Rozann Lesches MD Signature Date/Time: 03/11/2020/4:40:56 PM    Final

## 2020-03-19 NOTE — Progress Notes (Signed)
Patient currently on [place] 25L 100%. CPAP not placed at this time.

## 2020-03-19 NOTE — Progress Notes (Signed)
[facility] for Lovenox Indication: atrial fibrillation  Allergies  Allergen Reactions  . Sulfur Other (See Comments)    Thirsty, doesn't tolerate well per spouse    Patient Measurements: Height: 6' (182.9 cm) Weight: 132.7 kg (292 lb 8.8 oz) IBW/kg (Calculated) : 77.6  Vital Signs: Temp: 97.7 F (36.5 C) (09/05 2007) Temp Source: Oral (09/05 2007) BP: 118/60 (09/05 2214) Pulse Rate: 79 (09/05 2214)  Labs: Recent Labs    03/17/20 0143 03/17/20 0143 03/18/20 0505 03/19/20 0254 03/19/20 2240  HGB 14.4   < > 13.5 13.8  --   HCT 44.2  --  41.9 42.8  --   PLT 272  --  246 217  --   HEPRLOWMOCWT  --   --   --   --  1.85  CREATININE 1.03  --  0.96 0.87  --    < > = values in this interval not displayed.    Estimated Creatinine Clearance: 119.3 mL/min (by C-G formula based on SCr of 0.87 mg/dL).   Assessment: 64 years of age male with COVID and large right pneumothorax status post chest tube placement on 9/3 AM now with atrial fibrillation. Pharmacy consulted to increase Lovenox therapy to full dose for atrial fibrillation.   Anti-Xa level 1.85 (supratherapeutic) - drawn ~5 hours post dose given. No bleeding noted.  Goal of Therapy:  Anti-Xa level 0.6-1 units/ml 4hrs after LMWH dose given  Monitor platelets by anticoagulation protocol: Yes   Plan:  Will hold Lovenox for now  Repeat anti-Xa level in 12 hours. If <1, restart Lovenox at decreased dosage.  Sherlon Handing, PharmD, BCPS Please see amion for complete clinical pharmacist phone list 03/19/2020,11:27 PM

## 2020-03-19 NOTE — Progress Notes (Signed)
PCCM paged about scheduled flush for chest tube. MD informed of small level 1 air leak in chamber likely from scheduled flush yesterday and decreased pneumothorax size per today CXR . Per MD okay to not flush as scheduled this am.

## 2020-03-19 NOTE — Progress Notes (Signed)
NAME:  Michael Wolf, MRN:  [id number], DOB:  10-20-1955, LOS: 9 ADMISSION DATE:  03/06/2020, CONSULTATION DATE:  03/17/20 REFERRING MD:  Oren Binet  CHIEF COMPLAINT:  SOB   Brief History   64 year old man presenting with severe COVID pneumonia complicated by R sided pneumothorax. Pigtail was placed bt PCCM on 9/3   Past Medical History   Past Medical History:  Diagnosis Date  . Cancer ([place])   . High cholesterol   . Hypertension   . Non Hodgkin's lymphoma ([place])   . Obesity   . Sleep apnea      Significant Hospital Events   8/20-8/26 initial [place] admission 8/27 readmission  Consults:  PCCM  Procedures:  9/3 Pigtail chest tube  Significant Diagnostic Tests:  9/3CXR large R PTX 9/5 CXR persistent small PTX Micro Data:  COVID +  Antimicrobials:  N/a   Interim history/subjective:  Patient is feeling better s/p chest tube, CXR shows small persistent apical PTX on Rt side  Objective   Blood pressure 120/64, pulse 86, temperature 98.2 F (36.8 C), temperature source Oral, resp. rate (!) 21, height 6' (1.829 m), weight 132.7 kg, SpO2 91 %.    FiO2 (%):  [100 %] 100 %   Intake/Output Summary (Last 24 hours) at 03/19/2020 1559 Last data filed at 03/19/2020 1557 Gross per 24 hour  Intake 840 ml  Output 1600 ml  Net -760 ml   Filed Weights   03/17/20 0506 03/18/20 0300 03/19/20 0500  Weight: 132.2 kg 131.2 kg 132.7 kg    Examination: General: Chronically ill appearing man, lying in bed HENT: [place]/NT, oral mucosa is moist Lungs: Course breath sounds b/l, occasional accessory muscle use, no wheezes Cardiovascular: tachycardic, ext warm Abdomen: protuberant, hypoactive BS Extremities: trace edema Neuro: moves all 4 ex to command Skin: no rashes   Resolved Hospital Problem list   N/A  Assessment & Plan:  Severe COVID Pneumonia Persistent small Right sided apical pneumothorax due to above 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  Best practice:  Diet: per primary Pain/Anxiety/Delirium protocol (if indicated): per primary VAP protocol (if indicated): N/A DVT prophylaxis: per primary GI prophylaxis: per primary Glucose control: per primary Mobility: per primary Code Status: per primary Family Communication: per primary Disposition: 5w  Labs   CBC: Recent Labs  Lab 03/13/20 0246 03/13/20 0246 03/14/20 0319 03/14/20 0319 03/15/20 0355 03/16/20 1239 03/17/20 0143 03/18/20 0505 03/19/20 0254  WBC 13.9*   < > 11.2*   < > 13.4* 19.3* 15.6* 14.1* 14.4*  NEUTROABS 12.7*  --  10.6*  --  12.6*  --   --   --   --   HGB 13.1   < > 12.9*   < > 13.4 15.9 14.4 13.5 13.8  HCT 40.8   < > 40.3   < > 40.7 47.7 44.2 41.9 42.8  MCV 88.9   < > 89.0   < > 89.8 88.2 87.9 89.5 90.9  PLT 369   < > 331   < > 339 372 272 246 217   < > = values in this interval not displayed.    Basic Metabolic Panel: Recent Labs  Lab 03/15/20 0355 03/16/20 1239 03/17/20 0143 03/18/20 0505 03/19/20 0254  NA 136 138 137 139 141  K 4.7 4.2 3.9 3.9 4.0  CL 95* 93* 94* 97* 99  CO2 25 28 32 33* 32  GLUCOSE 136* 139* 167* 153* 153*  BUN 26* 29* 31* 23 23  CREATININE 0.96 1.10 1.03 0.96 0.87  CALCIUM 8.3* 8.8* 8.6* 8.2* 8.3*   GFR: Estimated Creatinine Clearance: 119.3 mL/min (by C-G formula based on SCr of 0.87 mg/dL). Recent Labs  Lab 03/16/20 1239 03/17/20 0143 03/18/20 0505 03/19/20 0254  WBC 19.3* 15.6* 14.1* 14.4*    Liver Function Tests: Recent Labs  Lab 03/15/20 0355 03/16/20 1239 03/17/20 0143 03/18/20 0505 03/19/20 0254  AST 35 36 35 28 29  ALT 60* 58* 52* 46* 49*  ALKPHOS 58 72 66 63 64  BILITOT 1.0 1.5* 1.3* 1.2 1.0  PROT 5.5* 6.6 5.6* 5.2* 5.3*  ALBUMIN 2.9* 3.5 3.1* 2.6* 2.5*   No results for input(s): LIPASE, AMYLASE in the last 168 hours. No results for input(s): AMMONIA in the last 168  hours.  ABG No results found for: PHART, PCO2ART, PO2ART, HCO3, TCO2, ACIDBASEDEF, O2SAT   Coagulation Profile: No results for input(s): INR, PROTIME in the last 168 hours.  Cardiac Enzymes: No results for input(s): CKTOTAL, CKMB, CKMBINDEX, TROPONINI in the last 168 hours.  HbA1C: Hgb A1c MFr Bld  Date/Time Value Ref Range Status  03/08/2020 05:00 AM 6.9 (H) 4.8 - 5.6 % Final    Comment:    (NOTE) Pre diabetes:          5.7%-6.4%  Diabetes:              >6.4%  Glycemic control for   <7.0% adults with diabetes     CBG: Recent Labs  Lab 03/18/20 1200 03/18/20 1629 03/18/20 2117 03/19/20 0733 03/19/20 1204  GLUCAP 124* 149* 145* 140* 127*    Review of Systems:    Positive Symptoms in bold:  Constitutional fevers, chills, weight loss, fatigue, anorexia, malaise  Eyes decreased vision, double vision, eye irritation  Ears, Nose, Mouth, Throat sore throat, trouble swallowing, sinus congestion  Cardiovascular chest pain, paroxysmal nocturnal dyspnea, lower ext edema, palpitations   Respiratory SOB, cough, DOE, hemoptysis, wheezing  Gastrointestinal nausea, vomiting, diarrhea  Genitourinary burning with urination, trouble urinating  Musculoskeletal joint aches, joint swelling, back pain  Integumentary  rashes, skin lesions  Neurological focal weakness, focal numbness, trouble speaking, headaches  Psychiatric depression, anxiety, confusion  Endocrine polyuria, polydipsia, cold intolerance, heat intolerance  Hematologic abnormal bruising, abnormal bleeding, unexplained nose bleeds  Allergic/Immunologic recurrent infections, hives, swollen lymph nodes     Past Medical History  He,  has a past medical history of Cancer ([place]), High cholesterol, Hypertension, Non Hodgkin's lymphoma ([place]), Obesity, and Sleep apnea.   Surgical History    Past Surgical History:  Procedure Laterality Date  . HIP SURGERY       Social History   reports that he has never smoked. He  has never used smokeless tobacco. He reports that he does not drink alcohol and does not use drugs.   Family History   His family history is not on file.   Allergies Allergies  Allergen Reactions  . Sulfur Other (See Comments)    Thirsty, doesn't tolerate well per spouse     Home Medications  Prior to Admission medications   Medication Sig Start Date End Date Taking? Authorizing Provider  cetirizine (ZYRTEC) 10 MG tablet Take 10 mg by mouth daily.   Yes [provider]  clonazePAM (KLONOPIN) 1 MG tablet Take 0.5-1 mg by mouth at bedtime. 01/08/20  Yes [provider]  desvenlafaxine (PRISTIQ) 50  MG 24 hr tablet Take 50 mg by mouth daily.   Yes [provider]  dexamethasone (DECADRON) 6 MG tablet Take 1 tablet (6 mg total) by mouth daily. 02/13/2020  Yes Elgergawy, Silver Huguenin, MD  ezetimibe (ZETIA) 10 MG tablet Take 10 mg by mouth daily.   Yes [provider]  gabapentin (NEURONTIN) 300 MG capsule Take 300 mg by mouth 3 times/day as needed-between meals & bedtime (pain).  12/17/19  Yes [provider]  metFORMIN (GLUCOPHAGE) 500 MG tablet Take 1 tablet (500 mg total) by mouth 2 (two) times daily with a meal. 03/09/20 03/09/21 Yes Elgergawy, Silver Huguenin, MD  pantoprazole (PROTONIX) 40 MG tablet Take 1 tablet (40 mg total) by mouth daily. 03/09/20 04/08/20 Yes Elgergawy, Silver Huguenin, MD  rosuvastatin (CRESTOR) 40 MG tablet Take 40 mg by mouth daily.   Yes [provider]  triamterene-hydrochlorothiazide (MAXZIDE-25) 37.5-25 MG tablet Take 1 tablet by mouth daily. PLEASE START ON 8/29 Patient taking differently: Take 1 tablet by mouth daily.  03/12/20  Yes Elgergawy, Silver Huguenin, MD     Jacky Kindle MD Critical care physician [facility]  Pager: [phone] Mobile: [phone]

## 2020-03-20 ENCOUNTER — Inpatient Hospital Stay (HOSPITAL_COMMUNITY): Payer: Medicare Other

## 2020-03-20 LAB — COMPREHENSIVE METABOLIC PANEL
ALT: 53 U/L — ABNORMAL HIGH (ref 0–44)
AST: 32 U/L (ref 15–41)
Albumin: 2.6 g/dL — ABNORMAL LOW (ref 3.5–5.0)
Alkaline Phosphatase: 74 U/L (ref 38–126)
Anion gap: 11 (ref 5–15)
BUN: 23 mg/dL (ref 8–23)
CO2: 28 mmol/L (ref 22–32)
Calcium: 8.4 mg/dL — ABNORMAL LOW (ref 8.9–10.3)
Chloride: 97 mmol/L — ABNORMAL LOW (ref 98–111)
Creatinine, Ser: 0.88 mg/dL (ref 0.61–1.24)
GFR calc Af Amer: >60 mL/min (ref >60)
GFR calc non Af Amer: >60 mL/min (ref >60)
Glucose, Bld: 133 mg/dL — ABNORMAL HIGH (ref 70–99)
Potassium: 4.1 mmol/L (ref 3.5–5.1)
Sodium: 136 mmol/L (ref 135–145)
Total Bilirubin: 1.2 mg/dL (ref 0.3–1.2)
Total Protein: 5.6 g/dL — ABNORMAL LOW (ref 6.5–8.1)

## 2020-03-20 LAB — HEPARIN ANTI-XA: Heparin LMW: 0.69 IU/mL

## 2020-03-20 LAB — CBC
HCT: 46 % (ref 39.0–52.0)
Hemoglobin: 14.7 g/dL (ref 13.0–17.0)
MCH: 28.6 pg (ref 26.0–34.0)
MCHC: 32 g/dL (ref 30.0–36.0)
MCV: 89.5 fL (ref 80.0–100.0)
Platelets: 210 10*3/uL (ref 150–400)
RBC: 5.14 MIL/uL (ref 4.22–5.81)
RDW: 14.3 % (ref 11.5–15.5)
WBC: 18.6 10*3/uL — ABNORMAL HIGH (ref 4.0–10.5)
nRBC: 0 % (ref 0.0–0.2)

## 2020-03-20 LAB — FERRITIN: Ferritin: 1272 ng/mL — ABNORMAL HIGH (ref 24–336)

## 2020-03-20 LAB — GLUCOSE, CAPILLARY
Glucose-Capillary: 104 mg/dL — ABNORMAL HIGH (ref 70–99)
Glucose-Capillary: 140 mg/dL — ABNORMAL HIGH (ref 70–99)
Glucose-Capillary: 146 mg/dL — ABNORMAL HIGH (ref 70–99)
Glucose-Capillary: 113 mg/dL — ABNORMAL HIGH (ref 70–99)

## 2020-03-20 LAB — C-REACTIVE PROTEIN: CRP: 2.5 mg/dL — ABNORMAL HIGH (ref <1.0)

## 2020-03-20 LAB — D-DIMER, QUANTITATIVE: D-Dimer, Quant: 1.14 ug/mL-FEU — ABNORMAL HIGH (ref 0.00–0.50)

## 2020-03-20 MED ORDER — PREDNISONE 20 MG PO TABS
40.0000 mg | ORAL_TABLET | Freq: Every day | ORAL | Status: DC
  Administered 2020-03-21: 40 mg via ORAL
  Filled 2020-03-20: qty 2

## 2020-03-20 MED ORDER — PREDNISONE 20 MG PO TABS: 40.0000 mg | ORAL_TABLET | Freq: Every day | ORAL | Status: DC

## 2020-03-20 MED ORDER — ENOXAPARIN SODIUM 120 MG/0.8ML ~~LOC~~ SOLN
120.0000 mg | Freq: Two times a day (BID) | SUBCUTANEOUS | Status: DC
  Administered 2020-03-20 – 2020-03-24 (×8): 120 mg via SUBCUTANEOUS
  Filled 2020-03-20 (×8): qty 0.8

## 2020-03-20 MED ORDER — ORAL CARE MOUTH RINSE
15.0000 mL | Freq: Two times a day (BID) | OROMUCOSAL | Status: DC
  Administered 2020-03-21 – 2020-03-24 (×5): 15 mL via OROMUCOSAL

## 2020-03-20 NOTE — Progress Notes (Signed)
PT Cancellation Note  Patient Details Name: Michael Wolf MRN: [id number] DOB: 1956-03-05   Cancelled Treatment:    Reason Eval/Treat Not Completed: Fatigue limiting ability to participate. Pt reports he is worn out this afternoon. Will follow up tomorrow.    Shary Decamp [facility] 03/20/2020, 3:26 PM [place] Pager [phone] Office [phone]

## 2020-03-20 NOTE — Progress Notes (Signed)
Michael Wolf for Lovenox Indication: atrial fibrillation  Allergies  Allergen Reactions  . Sulfur Other (See Comments)    Thirsty, doesn't tolerate well per spouse    Patient Measurements: Height: 6' (182.9 cm) Weight: 135 kg (297 lb 9.9 oz) IBW/kg (Calculated) : 77.6  Vital Signs: Temp: 97.9 F (36.6 C) (09/06 0801) Temp Source: Oral (09/06 0359) BP: 119/67 (09/06 0801) Pulse Rate: 100 (09/06 0801)  Labs: Recent Labs    03/18/20 0505 03/18/20 0505 03/19/20 0254 03/19/20 2240 03/20/20 0252 03/20/20 1231  HGB 13.5   < > 13.8  --  14.7  --   HCT 41.9  --  42.8  --  46.0  --   PLT 246  --  217  --  210  --   HEPRLOWMOCWT  --   --   --  1.85  --  0.69  CREATININE 0.96  --  0.87  --  0.88  --    < > = values in this interval not displayed.    Estimated Creatinine Clearance: 119.1 mL/min (by C-G formula based on SCr of 0.88 mg/dL).   Assessment: 64 years of age male with COVID and large right pneumothorax status post chest tube placement on 9/3 AM now with atrial fibrillation. Pharmacy consulted to increase Lovenox therapy to full dose for atrial fibrillation.   Anti-Xa level 0.69 now   Goal of Therapy:  Anti-Xa level 0.6-1 units/ml 4hrs after LMWH dose given  Monitor platelets by anticoagulation protocol: Yes   Plan:  Restart Lovenox at 120 mg sq Q 12 hours tonight Follow CBC  Thank you Anette Guarneri, PharmD Please see amion for complete clinical pharmacist phone list 03/20/2020,1:22 PM

## 2020-03-20 NOTE — Progress Notes (Signed)
PROGRESS NOTE                                                                                                                                                                                                             Patient Demographics:    Michael Wolf, is a 64 y.o. male, DOB - 1956-07-06, URK:[id number]  Outpatient Primary MD for the patient is [name], MD   Admit date - 02/19/2020   LOS - 10  Chief Complaint  Patient presents with  . Shortness of Breath       Brief Narrative: Patient is a 64 y.o. male with PMHx of HTN, HLD, DM-2, non-Hodgkin's lymphoma in remission, OSA on CPAP, morbid obesity-recent breakthrough COVID-19 PNA with hypoxemia-discharged home on 8/26 with home O2-presented back to the ED on 8/27 with significant worsening of hypoxemia.  Subsequently admitted to the hospitalist service-hospital course further complicated by right-sided pneumothorax requiring placement of chest tube, and A. fib with RVR.    COVID-19 vaccinated status: Vaccinated  Significant Events: 8/20-8/26>> hospitalized for hypoxia due to COVID-19-discharged on home O2-2 L 8/27>> readmitted for worsening hypoxemia-now requiring 15 L of HFNC. 9/3>> chest x-ray with large right pneumothorax-PCCM consulted for chest tube placement 9/3-9/4>> atrial fib with RVR-spontaneously converted to sinus rhythm on 9/4  Significant studies: 8/27>>Chest x-ray: COPD, progressive right lower lobe infiltrate. 8/28>> CT chest: Extensive bilateral pulmonary groundglass opacification 8/28>> Echo: EF 55-60% 9/3>> chest x-ray: Large right pneumothorax 9/5>> chest x-ray: Right apical pneumothorax has decreased in size, stable right chest tube-no change in bilateral airspace disease  COVID-19 medications: Steroids: 8/20>> Remdesivir: 8/20>> 8/24 Baricitinib: 8/20>> 8/26, 8/28>>9/3  Antibiotics: None  Microbiology data: 8/27 >>blood  culture: Negative  Procedures: 9/3>> chest tube insertion (PCCM-Dr. Tamala Julian)  Consults: None  DVT prophylaxis: Therapeutic Lovenox    Subjective:   No major issues overnight-remains on heated high flow and NRB.  Thinks he slept well.  Only complaint is nasal congestion.   Assessment  & Plan :   Acute Hypoxic Resp Failure due to Covid 19 Viral pneumonia: Continues to have severe hypoxemia-on heated high flow and NRB this morning.  CRP downtrending-however no improvement in hypoxia as of yet.  Continue steroids but keep on tapering down-has been on steroids since admission.  No signs of volume overload-has received as needed Lasix-more than - balance 11L  so far  Fever: afebrile O2 requirements:  SpO2: (!) 89 % O2 Flow Rate (L/min): 30 L/min FiO2 (%): 100 %   COVID-19 Labs: Recent Labs    03/18/20 0505 03/19/20 0254 03/20/20 0252  DDIMER 0.69* 0.62* 1.14*  FERRITIN 1,236* 1,316* 1,272*  CRP 4.0* 4.8* 2.5*       Component Value Date/Time   BNP 39.4 03/14/2020 0319    No results for input(s): PROCALCITON in the last 168 hours.  Lab Results  Component Value Date   SARSCOV2NAA POSITIVE (A) 03/03/2020     Prone/Incentive Spirometry: encouraged  incentive spirometry use 3-4/hour.  Large right pneumothorax: Secondary to COVID-19-PCCM consulted-chest tube placed on 9/3.  Chest tube care deferred to PCCM.  A. fib with RVR: Occurred on 9/3 evening--managed with beta-blocker and 1 dose of digoxin-spontaneously converted to sinus rhythm on 9/4-remains in sinus rhythm on 9/6.  On therapeutic Lovenox given  CHA2DS2-VASc score of around 2-3.  Recent echo with preserved EF.   Leukocytosis: Likely secondary to steroids-we will check procalcitonin level with a.m. labs-but afebrile-no indication of bacterial infection at this point  HTN: BP stable-now on beta-blocker for rate control for A. fib.   HLD: Continue statin/Zetia.  DM-2 (A1c 6.9 on 03/08/2020): CBG stable-continue  SSI  Recent Labs    03/19/20 1606 03/19/20 2105 03/20/20 0757  GLUCAP 164* 103* 104*   History of diffuse large B-cell lymphoma:  in remission-follows with oncology at [facility] is to resume usual outpatient follow-up post discharge.  OSA: Unable to tolerate CPAP due to severe hypoxemia-resume when able  Palliative care discussion: Remains very tenuous-has severe parenchymal lung injury due to COVID-19-complicated by right-sided pneumothorax requiring chest tube.  Have had extensive discussion with both patient and spouse over the phone over the past few days-goals of care to continue with full scope of medical treatment but he is a DNR.  Both patient and spouse aware that if he is declines significantly-then apart from hospice care we really do not have any other options.  Morbid Obesity: Estimated body mass index is 40.36 kg/m as calculated from the following:   Height as of this encounter: 6' (1.829 m).   Weight as of this encounter: 135 kg.    ABG: No results found for: PHART, PCO2ART, PO2ART, HCO3, TCO2, ACIDBASEDEF, O2SAT  Vent Settings: N/A  Condition -  Guarded  Family Communication  :  Spouse Joaquim Lai [phone]) updated over the phone 9/6  Code Status :  DNR  Diet :  Diet Order            Diet Heart Room service appropriate? Yes; Fluid consistency: Thin  Diet effective now                  Disposition Plan  :   Status is: Inpatient  Remains inpatient appropriate because:Inpatient level of care appropriate due to severity of illness   Dispo: The patient is from: Home              Anticipated d/c is to: Home              Anticipated d/c date is: > 3 days              Patient currently is not medically stable to d/c.   Barriers to discharge: Hypoxia requiring O2 supplementation  Antimicorbials  :    Anti-infectives (From admission, onward)   None      Inpatient Medications  Scheduled Meds: . ezetimibe  10  mg Oral Daily  . fluticasone  2  spray Each Nare Daily  . gabapentin  300 mg Oral Q12H  . insulin aspart  0-15 Units Subcutaneous TID WC  . loratadine  10 mg Oral Daily  . methylPREDNISolone (SOLU-MEDROL) injection  40 mg Intravenous BID  . metoprolol tartrate  25 mg Oral BID  .  morphine injection  2 mg Intravenous Once  . oxymetazoline  2 spray Each Nare BID  . pantoprazole  40 mg Oral Daily  . rosuvastatin  40 mg Oral Daily  . senna-docusate  2 tablet Oral BID  . venlafaxine XR  75 mg Oral Q breakfast   Continuous Infusions: PRN Meds:.acetaminophen, clonazePAM, metoprolol tartrate, oxyCODONE, polyethylene glycol, sodium chloride   Time Spent in minutes  35  See all Orders from today for further details   Oren Binet M.D on 03/20/2020 at 11:17 AM  To page go to [email or web address] - use universal password  [facility] -  Office  [phone]    Objective:   Vitals:   03/20/20 0359 03/20/20 0400 03/20/20 0500 03/20/20 0801  BP: 115/76   119/67  Pulse: 85   100  Resp: (!) 22 (!) 23  (!) 24  Temp: 98.3 F (36.8 C)   97.9 F (36.6 C)  TempSrc: Oral     SpO2: 95%   (!) 89%  Weight:   135 kg   Height:        Wt Readings from Last 3 Encounters:  03/20/20 135 kg  03/03/20 136.1 kg  12/01/19 (!) 149.5 kg     Intake/Output Summary (Last 24 hours) at 03/20/2020 1117 Last data filed at 03/20/2020 0700 Gross per 24 hour  Intake 360 ml  Output 2065 ml  Net -1705 ml   Gen Exam:Alert awake-not in any distress HEENT:atraumatic, normocephalic Chest: B/L clear to auscultation anteriorly CVS:S1S2 regular Abdomen:soft non tender, non distended Extremities:no edema Neurology: Non focal Skin: no rash   Data Review:    CBC Recent Labs  Lab 03/14/20 0319 03/14/20 0319 03/15/20 0355 03/15/20 0355 03/16/20 1239 03/17/20 0143 03/18/20 0505 03/19/20 0254 03/20/20 0252  WBC 11.2*   < > 13.4*   < > 19.3* 15.6* 14.1* 14.4* 18.6*  HGB 12.9*   < > 13.4   < > 15.9 14.4 13.5 13.8 14.7  HCT 40.3   < >  40.7   < > 47.7 44.2 41.9 42.8 46.0  PLT 331   < > 339   < > 372 272 246 217 210  MCV 89.0   < > 89.8   < > 88.2 87.9 89.5 90.9 89.5  MCH 28.5   < > 29.6   < > 29.4 28.6 28.8 29.3 28.6  MCHC 32.0   < > 32.9   < > 33.3 32.6 32.2 32.2 32.0  RDW 13.8   < > 14.0   < > 14.2 14.1 14.2 14.4 14.3  LYMPHSABS 0.2*  --  0.2*  --   --   --   --   --   --   MONOABS 0.4  --  0.6  --   --   --   --   --   --   EOSABS 0.0  --  0.0  --   --   --   --   --   --   BASOSABS 0.0  --  0.0  --   --   --   --   --   --    < > =  values in this interval not displayed.    Chemistries  Recent Labs  Lab 03/16/20 1239 03/17/20 0143 03/18/20 0505 03/19/20 0254 03/20/20 0252  NA 138 137 139 141 136  K 4.2 3.9 3.9 4.0 4.1  CL 93* 94* 97* 99 97*  CO2 28 32 33* 32 28  GLUCOSE 139* 167* 153* 153* 133*  BUN 29* 31* 23 23 23   CREATININE 1.10 1.03 0.96 0.87 0.88  CALCIUM 8.8* 8.6* 8.2* 8.3* 8.4*  AST 36 35 28 29 32  ALT 58* 52* 46* 49* 53*  ALKPHOS 72 66 63 64 74  BILITOT 1.5* 1.3* 1.2 1.0 1.2   ------------------------------------------------------------------------------------------------------------------ No results for input(s): CHOL, HDL, LDLCALC, TRIG, CHOLHDL, LDLDIRECT in the last 72 hours.  Lab Results  Component Value Date   HGBA1C 6.9 (H) 03/08/2020   ------------------------------------------------------------------------------------------------------------------ Recent Labs    03/19/20 0254  TSH 0.221*   ------------------------------------------------------------------------------------------------------------------ Recent Labs    03/19/20 0254 03/20/20 0252  FERRITIN 1,316* 1,272*    Coagulation profile No results for input(s): INR, PROTIME in the last 168 hours.  Recent Labs    03/19/20 0254 03/20/20 0252  DDIMER 0.62* 1.14*    Cardiac Enzymes No results for input(s): CKMB, TROPONINI, MYOGLOBIN in the last 168 hours.  Invalid input(s):  CK ------------------------------------------------------------------------------------------------------------------    Component Value Date/Time   BNP 39.4 03/14/2020 0319    Micro Results Recent Results (from the past 240 hour(s))  Blood Culture (routine x 2)     Status: None   Collection Time: 02/20/2020  4:12 PM   Specimen: BLOOD  Result Value Ref Range Status   Specimen Description BLOOD RIGHT ANTECUBITAL  Final   Special Requests   Final    BOTTLES DRAWN AEROBIC AND ANAEROBIC Blood Culture results may not be optimal due to an inadequate volume of blood received in culture bottles   Culture   Final    NO GROWTH 5 DAYS Performed at [facility], [place] [address] [place], [place] [address]    Report Status 03/15/2020 FINAL  Final  Blood Culture (routine x 2)     Status: None   Collection Time: 02/16/2020  8:12 PM   Specimen: BLOOD LEFT HAND  Result Value Ref Range Status   Specimen Description BLOOD LEFT HAND  Final   Special Requests   Final    BOTTLES DRAWN AEROBIC AND ANAEROBIC Blood Culture adequate volume   Culture   Final    NO GROWTH 5 DAYS Performed at [facility], [place] [address], [place], [place] [address]    Report Status 03/15/2020 FINAL  Final    Radiology Reports DG Chest 1 View  Result Date: 03/17/2020 CLINICAL DATA:  Follow-up pneumothorax. EXAM: CHEST  1 VIEW COMPARISON:  Chest radiograph earlier this day and prior studies FINDINGS: A RIGHT thoracostomy tube is now noted with decrease in RIGHT pneumothorax, now small, approximately 15%. Diffuse bilateral airspace opacities are again noted. Cardiomegaly is unchanged. No other significant change identified. IMPRESSION: RIGHT thoracostomy tube placement with decreased RIGHT pneumothorax, now approximately 15%. Electronically Signed   By: Margarette Canada M.D.   On: 03/17/2020 09:36   CT CHEST WO CONTRAST  Result Date: 03/11/2020 CLINICAL DATA:  Pneumonia, unresolved. History of non-Hodgkin's lymphoma.  Positive for coronavirus. EXAM: CT CHEST WITHOUT CONTRAST TECHNIQUE: Multidetector CT imaging of the chest was performed following the standard protocol without IV contrast. COMPARISON:  Chest radiograph 03/06/2020 FINDINGS: Cardiovascular: Normal heart size. No pericardial effusion. Aortic atherosclerosis. Coronary artery  calcifications. Mediastinum/Nodes: No enlarged mediastinal or axillary lymph nodes. Thyroid gland, trachea, and esophagus demonstrate no significant findings. Lungs/Pleura: There is no pleural effusion. Extensive, bilateral pulmonary ground-glass opacification with patchy areas of nodular consolidation compatible with atypical viral pneumonia. There is a subpleural bleb identified within the posterior left lung base measuring 5.6 cm, image 145/6. Upper Abdomen: Multiple left lobe of liver cyst noted measuring up to 2.8 cm. No pain acute finding noted within the imaged portions of the upper abdomen. Musculoskeletal: No chest wall mass or suspicious bone lesions identified. IMPRESSION: 1. Extensive, bilateral pulmonary ground-glass opacification with patchy areas of nodular consolidation compatible with atypical viral pneumonia. 2. Coronary artery calcifications noted. 3. Aortic atherosclerosis. Aortic Atherosclerosis (ICD10-I70.0). Electronically Signed   By: Kerby Moors M.D.   On: 03/11/2020 11:37   DG Chest Port 1 View  Result Date: 03/18/2020 CLINICAL DATA:  COVID positive. Follow-up exam for respiratory opacities and right chest tube/pneumothorax. EXAM: PORTABLE CHEST 1 VIEW COMPARISON:  03/18/2020 at 12:05 a.m., and earlier studies. FINDINGS: Minimal right apical pneumothorax is stable from the earlier exam. Right chest tube is unchanged in position. Bilateral hazy airspace lung opacities are unchanged. No new lung abnormalities. IMPRESSION: 1. Minimal right apical pneumothorax is unchanged from the most recent prior exam. 2. Stable bilateral airspace lung opacities. Stable right-sided  chest tube. Electronically Signed   By: Lajean Manes M.D.   On: 03/18/2020 10:34   DG CHEST PORT 1 VIEW  Result Date: 03/18/2020 CLINICAL DATA:  Chest tube EXAM: PORTABLE CHEST 1 VIEW COMPARISON:  03/17/2020 FINDINGS: Right chest tube in place. Small residual right apical pneumothorax, decreased since prior study. Cardiomegaly. Diffuse airspace disease throughout the right lung. Patchy airspace disease on the left. IMPRESSION: Decreasing size of the right pneumothorax. Now small residual right apical pneumothorax remains. Bilateral airspace disease, right greater than left. Electronically Signed   By: Rolm Baptise M.D.   On: 03/18/2020 02:08   DG Chest Port 1 View  Result Date: 03/08/2020 CLINICAL DATA:  Short of breath EXAM: PORTABLE CHEST 1 VIEW COMPARISON:  03/05/2020 FINDINGS: Progression of right lower lobe airspace disease. Cardiac silhouette upper normal. Negative for heart failure. Left lung clear. Probable underlying COPD IMPRESSION: COPD.  Progressive right lower lobe infiltrate. Electronically Signed   By: Franchot Gallo M.D.   On: 03/03/2020 16:17   DG Chest Port 1 View  Result Date: 03/05/2020 CLINICAL DATA:  COVID-19. EXAM: PORTABLE CHEST 1 VIEW COMPARISON:  March 03, 2020 FINDINGS: New hazy infiltrates are seen in the lungs, particularly the upper lobes. The cardiomediastinal silhouette is stable. No pneumothorax. No other acute abnormalities. IMPRESSION: New hazy infiltrates, particularly in the upper lobes, worrisome for developing COVID-19 pneumonia given history. Electronically Signed   By: Dorise Bullion III M.D   On: 03/05/2020 08:38   DG Chest Port 1 View  Result Date: 03/03/2020 CLINICAL DATA:  COVID-19 diagnosed 2 weeks ago, increasing shortness of breath, hypoxemia EXAM: PORTABLE CHEST 1 VIEW COMPARISON:  None. FINDINGS: Single frontal view of the chest demonstrates an unremarkable cardiac silhouette. Diffuse interstitial prominence is seen, with faint ground-glass  opacities within the mid and lower lung zones. No effusion or pneumothorax. No acute bony abnormality. IMPRESSION: 1. Interstitial prominence with bilateral ground-glass airspace disease. Differential includes multifocal atypical pneumonia versus edema. Electronically Signed   By: Randa Ngo M.D.   On: 03/03/2020 15:43   DG Chest Port 1V same Day  Result Date: 03/19/2020 CLINICAL DATA:  COVID-19 infection.  Follow-up exam.  EXAM: PORTABLE CHEST 1 VIEW COMPARISON:  03/18/2020 and earlier studies. FINDINGS: Tiny right apical pneumothorax has decreased in size from the previous exam. Right chest tube is stable, lying along right lateral mid hemithorax. Interstitial and hazy airspace lung opacities are unchanged from the previous day's exam. No new lung abnormalities. IMPRESSION: 1. Right apical pneumothorax has decreased in size, only a tiny pneumothorax persisting at the apex. Stable right chest tube. 2. No change in the bilateral airspace lung opacities consistent with multifocal pneumonia. Electronically Signed   By: Lajean Manes M.D.   On: 03/19/2020 08:46   DG Chest Port 1V same Day  Result Date: 03/17/2020 CLINICAL DATA:  Shortness of breath. EXAM: PORTABLE CHEST 1 VIEW COMPARISON:  Chest CT 03/12/2019.  Chest x-ray 03/03/2020. FINDINGS: Large right pneumothorax is new in the interval. Diffuse interstitial and ground-glass airspace disease in the left lung is progressive since chest x-ray of 03/09/2020. The cardio pericardial silhouette is enlarged. The visualized bony structures of the thorax show no acute abnormality. Telemetry leads overlie the chest. IMPRESSION: New large right pneumothorax. Interval progression of diffuse interstitial and alveolar opacity in the left lung. I personally called the results of this study at 0829 hours to the patient's nurse Greta, who took the report for Dr. Sloan Leiter, who was currently rounding. Electronically Signed   By: Misty Stanley M.D.   On: 03/17/2020 09:07    ECHOCARDIOGRAM LIMITED  Result Date: 03/11/2020    ECHOCARDIOGRAM LIMITED REPORT   Patient Name:   Michael Wolf Date of Exam: 03/11/2020 Medical Rec #:  [id number]       Height:       72.0 in Accession #:    [id number]      Weight:       305.7 lb Date of Birth:  June 17, 1956       BSA:          2.552 m Patient Age:    82 years        BP:           122/68 mmHg Patient Gender: M               HR:           85 bpm. Exam Location:  Inpatient Procedure: Limited Echo, Color Doppler and Cardiac Doppler Indications:   pulmonary hypertension 416.8  History:       Patient has no prior history of Echocardiogram examinations.                Covid.  Sonographer:   Johny Chess Referring      [id number] Lequita Halt Phys: IMPRESSIONS  1. Left ventricular ejection fraction, by estimation, is 55 to 60%. The left ventricle has normal function. The left ventricle has no regional wall motion abnormalities. Left ventricular diastolic parameters were normal.  2. Right ventricular systolic function is normal. The right ventricular size is mildly enlarged. Tricuspid regurgitation signal is inadequate for assessing PA pressure.  3. The aortic valve is tricuspid. Aortic valve regurgitation is not visualized. Mild aortic valve sclerosis is present, with no evidence of aortic valve stenosis.  4. The inferior vena cava is normal in size with greater than 50% respiratory variability, suggesting right atrial pressure of 3 mmHg. FINDINGS  Left Ventricle: Left ventricular ejection fraction, by estimation, is 55 to 60%. The left ventricle has normal function. The left ventricle has no regional wall motion abnormalities. The left ventricular internal cavity size was normal in size. There is  borderline left ventricular hypertrophy. Right Ventricle: The right ventricular size is mildly enlarged. No increase in right ventricular wall thickness. Right ventricular systolic function is normal. Tricuspid regurgitation signal is inadequate for  assessing PA pressure. Pericardium: There is no evidence of pericardial effusion. Presence of pericardial fat pad. Tricuspid Valve: The tricuspid valve is grossly normal. Tricuspid valve regurgitation is trivial. Aortic Valve: The aortic valve is tricuspid. Aortic valve regurgitation is not visualized. Mild aortic valve sclerosis is present, with no evidence of aortic valve stenosis. Mild aortic valve annular calcification. Pulmonic Valve: The pulmonic valve was grossly normal. Pulmonic valve regurgitation is trivial. Venous: The inferior vena cava is normal in size with greater than 50% respiratory variability, suggesting right atrial pressure of 3 mmHg. IAS/Shunts: No atrial level shunt detected by color flow Doppler. LEFT VENTRICLE PLAX 2D LVIDd:         5.00 cm  Diastology LVIDs:         3.50 cm  LV e' lateral:   13.90 cm/s LV PW:         1.10 cm  LV E/e' lateral: 5.1 LV IVS:        1.00 cm  LV e' medial:    12.60 cm/s LVOT diam:     2.30 cm  LV E/e' medial:  5.6 LV SV:         85 LV SV Index:   33 LVOT Area:     4.15 cm  IVC IVC diam: 1.90 cm LEFT ATRIUM         Index LA diam:    4.00 cm 1.57 cm/m  AORTIC VALVE LVOT Vmax:   106.00 cm/s LVOT Vmean:  68.700 cm/s LVOT VTI:    0.205 m MITRAL VALVE MV Area (PHT): 4.60 cm    SHUNTS MV Decel Time: 165 msec    Systemic VTI:  0.20 m MV E velocity: 70.30 cm/s  Systemic Diam: 2.30 cm MV A velocity: 90.80 cm/s MV E/A ratio:  0.77 Rozann Lesches MD Electronically signed by Rozann Lesches MD Signature Date/Time: 03/11/2020/4:40:56 PM    Final

## 2020-03-20 NOTE — Plan of Care (Signed)
  Problem: Health Behavior/Discharge Planning: Goal: Ability to manage health-related needs will improve Outcome: Progressing   Problem: Clinical Measurements: Goal: Diagnostic test results will improve Outcome: Progressing Goal: Respiratory complications will improve Outcome: Progressing   Problem: Activity: Goal: Risk for activity intolerance will decrease Outcome: Progressing   Problem: Nutrition: Goal: Adequate nutrition will be maintained Outcome: Progressing   Problem: Elimination: Goal: Will not experience complications related to bowel motility Outcome: Progressing Goal: Will not experience complications related to urinary retention Outcome: Progressing   Problem: Education: Goal: Knowledge of risk factors and measures for prevention of condition will improve Outcome: Progressing   Problem: Coping: Goal: Psychosocial and spiritual needs will be supported Outcome: Progressing   Problem: Respiratory: Goal: Will maintain a patent airway Outcome: Progressing Goal: Complications related to the disease process, condition or treatment will be avoided or minimized Outcome: Progressing

## 2020-03-20 NOTE — Progress Notes (Signed)
RT note. Pt. Refused CPAP tonight. Pt. On 35L 100% & NRB sat 89%. RT will continue to monitor.

## 2020-03-20 NOTE — Progress Notes (Signed)
NAME:  Michael Wolf, MRN:  [id number], DOB:  April 10, 1956, LOS: 50 ADMISSION DATE:  02/22/2020, CONSULTATION DATE:  03/17/20 REFERRING MD:  Oren Binet  CHIEF COMPLAINT:  SOB   Brief History   64 year old man presenting with severe COVID pneumonia complicated by R sided pneumothorax. Pigtail was placed bt PCCM on 9/3   Past Medical History   Past Medical History:  Diagnosis Date  . Cancer ([place])   . High cholesterol   . Hypertension   . Non Hodgkin's lymphoma ([place])   . Obesity   . Sleep apnea      Significant Hospital Events   8/20-8/26 initial [place] admission 8/27 readmission  Consults:  PCCM  Procedures:  9/3 Pigtail chest tube  Significant Diagnostic Tests:  9/3CXR large R PTX 9/5 CXR persistent small PTX Micro Data:  COVID +  Antimicrobials:  N/a   Interim history/subjective:  Chest tube remain in place, still showing air bubbles with each breath, consistent with bronchopleural fistula  Objective   Blood pressure 119/67, pulse 100, temperature 97.9 F (36.6 C), resp. rate (!) 24, height 6' (1.829 m), weight 135 kg, SpO2 (!) 89 %.    FiO2 (%):  [90 %-100 %] 100 %   Intake/Output Summary (Last 24 hours) at 03/20/2020 1326 Last data filed at 03/20/2020 0700 Gross per 24 hour  Intake 360 ml  Output 2065 ml  Net -1705 ml   Filed Weights   03/18/20 0300 03/19/20 0500 03/20/20 0500  Weight: 131.2 kg 132.7 kg 135 kg    Examination: General: Chronically ill appearing man, lying in bed HENT: [place]/NT, oral mucosa is moist Lungs: Course breath sounds b/l, no wheezes or rhonchi.  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  Rest of the management per primary team PCCM will follow along.  Best practice:  Diet: per primary Pain/Anxiety/Delirium protocol (if indicated): per primary VAP protocol (if indicated): N/A DVT prophylaxis: per primary GI prophylaxis: per primary Glucose control: per primary Mobility: per primary Code Status: per primary Family Communication: per primary Disposition: 5w  Labs   CBC: Recent Labs  Lab 03/14/20 0319 03/14/20 0319 03/15/20 0355 03/15/20 0355 03/16/20 1239 03/17/20 0143 03/18/20 0505 03/19/20 0254 03/20/20 0252  WBC 11.2*   < > 13.4*   < > 19.3* 15.6* 14.1* 14.4* 18.6*  NEUTROABS 10.6*  --  12.6*  --   --   --   --   --   --   HGB 12.9*   < > 13.4   < > 15.9 14.4 13.5 13.8 14.7  HCT 40.3   < > 40.7   < > 47.7 44.2 41.9 42.8 46.0  MCV 89.0   < > 89.8   < > 88.2 87.9 89.5 90.9 89.5  PLT 331   < > 339   < > 372 272 246 217 210   < > = values in this interval not  displayed.    Basic Metabolic Panel: Recent Labs  Lab 03/16/20 1239 03/17/20 0143 03/18/20 0505 03/19/20 0254 03/20/20 0252  NA 138 137 139 141 136  K 4.2 3.9 3.9 4.0 4.1  CL 93* 94* 97* 99 97*  CO2 28 32 33* 32 28  GLUCOSE 139* 167* 153* 153* 133*  BUN 29* 31* 23 23 23   CREATININE 1.10 1.03 0.96 0.87 0.88  CALCIUM 8.8* 8.6* 8.2* 8.3* 8.4*   GFR: Estimated Creatinine Clearance: 119.1 mL/min (by C-G formula based on SCr of 0.88 mg/dL). Recent Labs  Lab 03/17/20 0143 03/18/20 0505 03/19/20 0254 03/20/20 0252  WBC 15.6* 14.1* 14.4* 18.6*    Liver Function Tests: Recent Labs  Lab 03/16/20 1239 03/17/20 0143 03/18/20 0505 03/19/20 0254 03/20/20 0252  AST 36 35 28 29  32  ALT 58* 52* 46* 49* 53*  ALKPHOS 72 66 63 64 74  BILITOT 1.5* 1.3* 1.2 1.0 1.2  PROT 6.6 5.6* 5.2* 5.3* 5.6*  ALBUMIN 3.5 3.1* 2.6* 2.5* 2.6*   No results for input(s): LIPASE, AMYLASE in the last 168 hours. No results for input(s): AMMONIA in the last 168 hours.  ABG No results found for: PHART, PCO2ART, PO2ART, HCO3, TCO2, ACIDBASEDEF, O2SAT   Coagulation Profile: No results for input(s): INR, PROTIME in the last 168 hours.  Cardiac Enzymes: No results for input(s): CKTOTAL, CKMB, CKMBINDEX, TROPONINI in the last 168 hours.  HbA1C: Hgb A1c MFr Bld  Date/Time Value Ref Range Status  03/08/2020 05:00 AM 6.9 (H) 4.8 - 5.6 % Final    Comment:    (NOTE) Pre diabetes:          5.7%-6.4%  Diabetes:              >6.4%  Glycemic control for   <7.0% adults with diabetes     CBG: Recent Labs  Lab 03/19/20 1204 03/19/20 1606 03/19/20 2105 03/20/20 0757 03/20/20 1205  GLUCAP 127* 164* 103* 104* 140*    Review of Systems:    Positive Symptoms in bold:  Constitutional fevers, chills, weight loss, fatigue, anorexia, malaise  Eyes decreased vision, double vision, eye irritation  Ears, Nose, Mouth, Throat sore throat, trouble swallowing, sinus congestion  Cardiovascular chest pain, paroxysmal nocturnal dyspnea, lower ext edema, palpitations   Respiratory SOB, cough, DOE, hemoptysis, wheezing  Gastrointestinal nausea, vomiting, diarrhea  Genitourinary burning with urination, trouble urinating  Musculoskeletal joint aches, joint swelling, back pain  Integumentary  rashes, skin lesions  Neurological focal weakness, focal numbness, trouble speaking, headaches  Psychiatric depression, anxiety, confusion  Endocrine polyuria, polydipsia, cold intolerance, heat intolerance  Hematologic abnormal bruising, abnormal bleeding, unexplained nose bleeds  Allergic/Immunologic recurrent infections, hives, swollen lymph nodes     Past Medical History  He,  has a past medical  history of Cancer ([place]), High cholesterol, Hypertension, Non Hodgkin's lymphoma ([place]), Obesity, and Sleep apnea.   Surgical History    Past Surgical History:  Procedure Laterality Date  . HIP SURGERY       Social History   reports that he has never smoked. He has never used smokeless tobacco. He reports that he does not drink alcohol and does not use drugs.   Family History   His family history is not on file.   Allergies Allergies  Allergen Reactions  . Sulfur Other (See Comments)    Thirsty, doesn't tolerate well per spouse     Home Medications  Prior to Admission medications   Medication Sig Start Date End Date Taking?  Authorizing Provider  cetirizine (ZYRTEC) 10 MG tablet Take 10 mg by mouth daily.   Yes [provider]  clonazePAM (KLONOPIN) 1 MG tablet Take 0.5-1 mg by mouth at bedtime. 01/08/20  Yes [provider]  desvenlafaxine (PRISTIQ) 50 MG 24 hr tablet Take 50 mg by mouth daily.   Yes [provider]  dexamethasone (DECADRON) 6 MG tablet Take 1 tablet (6 mg total) by mouth daily. 03/03/2020  Yes Elgergawy, Silver Huguenin, MD  ezetimibe (ZETIA) 10 MG tablet Take 10 mg by mouth daily.   Yes [provider]  gabapentin (NEURONTIN) 300 MG capsule Take 300 mg by mouth 3 times/day as needed-between meals & bedtime (pain).  12/17/19  Yes [provider]  metFORMIN (GLUCOPHAGE) 500 MG tablet Take 1 tablet (500 mg total) by mouth 2 (two) times daily with a meal. 03/09/20 03/09/21 Yes Elgergawy, Silver Huguenin, MD  pantoprazole (PROTONIX) 40 MG tablet Take 1 tablet (40 mg total) by mouth daily. 03/09/20 04/08/20 Yes Elgergawy, Silver Huguenin, MD  rosuvastatin (CRESTOR) 40 MG tablet Take 40 mg by mouth daily.   Yes [provider]  triamterene-hydrochlorothiazide (MAXZIDE-25) 37.5-25 MG tablet Take 1 tablet by mouth daily. PLEASE START ON 8/29 Patient taking differently: Take 1 tablet by mouth daily.  03/12/20  Yes Elgergawy, Silver Huguenin, MD     Jacky Kindle MD Critical care physician [facility] Critical Care  Pager: [phone] Mobile: [phone]

## 2020-03-21 ENCOUNTER — Inpatient Hospital Stay (HOSPITAL_COMMUNITY): Payer: Medicare Other

## 2020-03-21 LAB — GLUCOSE, CAPILLARY
Glucose-Capillary: 118 mg/dL — ABNORMAL HIGH (ref 70–99)
Glucose-Capillary: 132 mg/dL — ABNORMAL HIGH (ref 70–99)
Glucose-Capillary: 125 mg/dL — ABNORMAL HIGH (ref 70–99)

## 2020-03-21 LAB — COMPREHENSIVE METABOLIC PANEL
ALT: 48 U/L — ABNORMAL HIGH (ref 0–44)
AST: 30 U/L (ref 15–41)
Albumin: 2.5 g/dL — ABNORMAL LOW (ref 3.5–5.0)
Alkaline Phosphatase: 79 U/L (ref 38–126)
Anion gap: 15 (ref 5–15)
BUN: 24 mg/dL — ABNORMAL HIGH (ref 8–23)
CO2: 24 mmol/L (ref 22–32)
Calcium: 8.5 mg/dL — ABNORMAL LOW (ref 8.9–10.3)
Chloride: 101 mmol/L (ref 98–111)
Creatinine, Ser: 0.94 mg/dL (ref 0.61–1.24)
GFR calc Af Amer: >60 mL/min (ref >60)
GFR calc non Af Amer: >60 mL/min (ref >60)
Glucose, Bld: 122 mg/dL — ABNORMAL HIGH (ref 70–99)
Potassium: 4.2 mmol/L (ref 3.5–5.1)
Sodium: 140 mmol/L (ref 135–145)
Total Bilirubin: 1.3 mg/dL — ABNORMAL HIGH (ref 0.3–1.2)
Total Protein: 5.7 g/dL — ABNORMAL LOW (ref 6.5–8.1)

## 2020-03-21 LAB — PROCALCITONIN: Procalcitonin: <0.1 ng/mL

## 2020-03-21 LAB — FERRITIN: Ferritin: 1348 ng/mL — ABNORMAL HIGH (ref 24–336)

## 2020-03-21 LAB — D-DIMER, QUANTITATIVE: D-Dimer, Quant: 1.77 ug/mL-FEU — ABNORMAL HIGH (ref 0.00–0.50)

## 2020-03-21 LAB — C-REACTIVE PROTEIN: CRP: 6.1 mg/dL — ABNORMAL HIGH (ref <1.0)

## 2020-03-21 MED ORDER — MIDODRINE HCL 5 MG PO TABS
5.0000 mg | ORAL_TABLET | Freq: Three times a day (TID) | ORAL | Status: DC
  Administered 2020-03-21 – 2020-03-22 (×2): 5 mg via ORAL
  Filled 2020-03-21 (×2): qty 1

## 2020-03-21 MED ORDER — ALBUTEROL SULFATE HFA 108 (90 BASE) MCG/ACT IN AERS
2.0000 | INHALATION_SPRAY | RESPIRATORY_TRACT | Status: DC | PRN
  Filled 2020-03-21: qty 6.7

## 2020-03-21 MED ORDER — SODIUM CHLORIDE 0.9 % IV BOLUS
500.0000 mL | Freq: Once | INTRAVENOUS | Status: AC
  Administered 2020-03-21: 500 mL via INTRAVENOUS

## 2020-03-21 MED ORDER — IPRATROPIUM-ALBUTEROL 20-100 MCG/ACT IN AERS
1.0000 | INHALATION_SPRAY | Freq: Four times a day (QID) | RESPIRATORY_TRACT | Status: DC
  Administered 2020-03-21 – 2020-03-24 (×13): 1 via RESPIRATORY_TRACT
  Filled 2020-03-21: qty 4

## 2020-03-21 MED ORDER — MORPHINE SULFATE (PF) 2 MG/ML IV SOLN
2.0000 mg | Freq: Once | INTRAVENOUS | Status: AC
  Administered 2020-03-21: 2 mg via INTRAVENOUS
  Filled 2020-03-21: qty 1

## 2020-03-21 NOTE — Progress Notes (Signed)
PT Cancellation Note  Patient Details Name: Michael Wolf MRN: [id number] DOB: Jul 03, 1956   Cancelled Treatment:    Reason Eval/Treat Not Completed: Other (comment). Pt reports he had no sleep last night and feels too SOB to mobilize or perform any in bed exercises at this time. Will continue to follow as pt tolerates.   [place] 03/21/2020, 12:22 PM [place] Pager [phone] Office [phone]

## 2020-03-21 NOTE — Progress Notes (Signed)
PROGRESS NOTE                                                                                                                                                                                                             Patient Demographics:    Michael Wolf, is a 63 y.o. male, DOB - 1955-09-20, SLH:[id number]  Outpatient Primary MD for the patient is [name], MD   Admit date - 02/28/2020   LOS - 52  Chief Complaint  Patient presents with  . Shortness of Breath       Brief Narrative: Patient is a 64 y.o. male with PMHx of HTN, HLD, DM-2, non-Hodgkin's lymphoma in remission, OSA on CPAP, morbid obesity-recent breakthrough COVID-19 PNA with hypoxemia-discharged home on 8/26 with home O2-presented back to the ED on 8/27 with significant worsening of hypoxemia.  Subsequently admitted to the hospitalist service-hospital course further complicated by right-sided pneumothorax requiring placement of chest tube, and A. fib with RVR.    COVID-19 vaccinated status: Vaccinated  Significant Events: 8/20-8/26>> hospitalized for hypoxia due to COVID-19-discharged on home O2-2 L 8/27>> readmitted for worsening hypoxemia-now requiring 15 L of HFNC. 9/3>> chest x-ray with large right pneumothorax-PCCM consulted for chest tube placement 9/3-9/4>> atrial fib with RVR-spontaneously converted to sinus rhythm on 9/4  Significant studies: 8/27>>Chest x-ray: COPD, progressive right lower lobe infiltrate. 8/28>> CT chest: Extensive bilateral pulmonary groundglass opacification 8/28>> Echo: EF 55-60% 9/3>> chest x-ray: Large right pneumothorax 9/5>> chest x-ray: Right apical pneumothorax has decreased in size, stable right chest tube-no change in bilateral airspace disease  COVID-19 medications: Steroids: 8/20>> Remdesivir: 8/20>> 8/24 Baricitinib: 8/20>> 8/26, 8/28>>9/3  Antibiotics: None  Microbiology data: 8/27 >>blood  culture: Negative  Procedures: 9/3>> chest tube insertion (PCCM-Dr. Tamala Julian)  Consults: None  DVT prophylaxis: Therapeutic Lovenox    Subjective:   Claims he could not sleep well last night-remains on heated high flow and NRB.  He appears essentially unchanged.   Assessment  & Plan :   Acute Hypoxic Resp Failure due to Covid 19 Viral pneumonia: Remains unchanged for the past several days-continues to have severe hypoxemia requiring heated high flow and NRB.  CRP is trending up-but patient appears clinically unchanged.  On tapering steroids-no evidence of volume overload (> -12 L)-continue supportive care-continue attempts to slowly titrate down FiO2  Fever: afebrile O2 requirements:  SpO2: 94 % O2 Flow Rate (L/min): 40 L/min FiO2 (%): 100 %   COVID-19 Labs: Recent Labs    03/19/20 0254 03/20/20 0252 03/21/20 0352  DDIMER 0.62* 1.14* 1.77*  FERRITIN 1,316* 1,272* 1,348*  CRP 4.8* 2.5* 6.1*       Component Value Date/Time   BNP 39.4 03/14/2020 0319    Recent Labs  Lab 03/21/20 0352  PROCALCITON <0.10    Lab Results  Component Value Date   SARSCOV2NAA POSITIVE (A) 03/03/2020     Prone/Incentive Spirometry: encouraged  incentive spirometry use 3-4/hour.  Large right pneumothorax: Secondary to COVID-19-PCCM consulted-chest tube placed on 9/3.  Chest tube care deferred to PCCM.  A. fib with RVR: Occurred on 9/3 evening--managed with beta-blocker and 1 dose of digoxin-spontaneously converted to sinus rhythm on 9/4-remains in sinus rhythm on 9/6.  On therapeutic Lovenox given  CHA2DS2-VASc score of around 2-3.  Recent echo with preserved EF.   Leukocytosis: Likely secondary to steroids-repeat procalcitonin on 9/7 -.  No indication to start antibiotics.  HTN: BP stable-now on beta-blocker for rate control for A. fib.   HLD: Continue statin/Zetia.  DM-2 (A1c 6.9 on 03/08/2020): CBG stable-continue SSI  Recent Labs    03/20/20 2105 03/21/20 0834 03/21/20 1133    GLUCAP 113* 118* 132*   History of diffuse large B-cell lymphoma:  in remission-follows with oncology at [facility] is to resume usual outpatient follow-up post discharge.  OSA: Unable to tolerate CPAP due to severe hypoxemia-resume when able  Palliative care discussion: Remains very tenuous-has severe parenchymal lung injury due to COVID-19-complicated by right-sided pneumothorax requiring chest tube.  Have had extensive discussion with both patient and spouse over the phone over the past few days-goals of care to continue with full scope of medical treatment but he is a DNR.  Both patient and spouse aware that if he is declines significantly-then apart from hospice care we really do not have any other options.  Morbid Obesity: Estimated body mass index is 40.36 kg/m as calculated from the following:   Height as of this encounter: 6' (1.829 m).   Weight as of this encounter: 135 kg.    ABG: No results found for: PHART, PCO2ART, PO2ART, HCO3, TCO2, ACIDBASEDEF, O2SAT  Vent Settings: N/A  Condition -  Guarded  Family Communication  :  Spouse Joaquim Lai [phone]) updated over the phone 9/6  Code Status :  DNR  Diet :  Diet Order            Diet Heart Room service appropriate? Yes; Fluid consistency: Thin  Diet effective now                  Disposition Plan  :   Status is: Inpatient  Remains inpatient appropriate because:Inpatient level of care appropriate due to severity of illness   Dispo: The patient is from: Home              Anticipated d/c is to: Home              Anticipated d/c date is: > 3 days              Patient currently is not medically stable to d/c.   Barriers to discharge: Hypoxia requiring O2 supplementation  Antimicorbials  :    Anti-infectives (From admission, onward)   None      Inpatient Medications  Scheduled Meds: . enoxaparin (LOVENOX) injection  120 mg Subcutaneous Q12H  . ezetimibe  10 mg Oral  Daily  . fluticasone  2 spray Each  Nare Daily  . gabapentin  300 mg Oral Q12H  . insulin aspart  0-15 Units Subcutaneous TID WC  . Ipratropium-Albuterol  1 puff Inhalation QID  . loratadine  10 mg Oral Daily  . mouth rinse  15 mL Mouth Rinse BID  . metoprolol tartrate  25 mg Oral BID  .  morphine injection  2 mg Intravenous Once  . oxymetazoline  2 spray Each Nare BID  . pantoprazole  40 mg Oral Daily  . predniSONE  40 mg Oral Q breakfast  . rosuvastatin  40 mg Oral Daily  . senna-docusate  2 tablet Oral BID  . venlafaxine XR  75 mg Oral Q breakfast   Continuous Infusions: PRN Meds:.acetaminophen, albuterol, clonazePAM, metoprolol tartrate, oxyCODONE, polyethylene glycol, sodium chloride   Time Spent in minutes  35  See all Orders from today for further details   Oren Binet M.D on 03/21/2020 at 1:00 PM  To page go to [email or web address] - use universal password  [facility] -  Office  [phone]    Objective:   Vitals:   03/21/20 0904 03/21/20 0905 03/21/20 1012 03/21/20 1135  BP:   106/71 109/71  Pulse:   (!) 103 83  Resp:   (!) 26 (!) 25  Temp:   97.9 F (36.6 C) 97.9 F (36.6 C)  TempSrc:   Oral Oral  SpO2: 93% 93% 93% 94%  Weight:      Height:        Wt Readings from Last 3 Encounters:  03/20/20 135 kg  03/03/20 136.1 kg  12/01/19 (!) 149.5 kg     Intake/Output Summary (Last 24 hours) at 03/21/2020 1300 Last data filed at 03/21/2020 0800 Gross per 24 hour  Intake 372.68 ml  Output 1380 ml  Net -1007.32 ml   Gen Exam:Alert awake-not in any distress HEENT:atraumatic, normocephalic Chest: B/L clear to auscultation anteriorly CVS:S1S2 regular Abdomen:soft non tender, non distended Extremities:no edema Neurology: Non focal Skin: no rash   Data Review:    CBC Recent Labs  Lab 03/15/20 0355 03/15/20 0355 03/16/20 1239 03/17/20 0143 03/18/20 0505 03/19/20 0254 03/20/20 0252  WBC 13.4*   < > 19.3* 15.6* 14.1* 14.4* 18.6*  HGB 13.4   < > 15.9 14.4 13.5 13.8 14.7  HCT 40.7    < > 47.7 44.2 41.9 42.8 46.0  PLT 339   < > 372 272 246 217 210  MCV 89.8   < > 88.2 87.9 89.5 90.9 89.5  MCH 29.6   < > 29.4 28.6 28.8 29.3 28.6  MCHC 32.9   < > 33.3 32.6 32.2 32.2 32.0  RDW 14.0   < > 14.2 14.1 14.2 14.4 14.3  LYMPHSABS 0.2*  --   --   --   --   --   --   MONOABS 0.6  --   --   --   --   --   --   EOSABS 0.0  --   --   --   --   --   --   BASOSABS 0.0  --   --   --   --   --   --    < > = values in this interval not displayed.    Chemistries  Recent Labs  Lab 03/17/20 0143 03/18/20 0505 03/19/20 0254 03/20/20 0252 03/21/20 0352  NA 137 139 141 136 140  K 3.9 3.9 4.0 4.1 4.2  CL  94* 97* 99 97* 101  CO2 32 33* 32 28 24  GLUCOSE 167* 153* 153* 133* 122*  BUN 31* 23 23 23  24*  CREATININE 1.03 0.96 0.87 0.88 0.94  CALCIUM 8.6* 8.2* 8.3* 8.4* 8.5*  AST 35 28 29 32 30  ALT 52* 46* 49* 53* 48*  ALKPHOS 66 63 64 74 79  BILITOT 1.3* 1.2 1.0 1.2 1.3*   ------------------------------------------------------------------------------------------------------------------ No results for input(s): CHOL, HDL, LDLCALC, TRIG, CHOLHDL, LDLDIRECT in the last 72 hours.  Lab Results  Component Value Date   HGBA1C 6.9 (H) 03/08/2020   ------------------------------------------------------------------------------------------------------------------ Recent Labs    03/19/20 0254  TSH 0.221*   ------------------------------------------------------------------------------------------------------------------ Recent Labs    03/20/20 0252 03/21/20 0352  FERRITIN 1,272* 1,348*    Coagulation profile No results for input(s): INR, PROTIME in the last 168 hours.  Recent Labs    03/20/20 0252 03/21/20 0352  DDIMER 1.14* 1.77*    Cardiac Enzymes No results for input(s): CKMB, TROPONINI, MYOGLOBIN in the last 168 hours.  Invalid input(s): CK ------------------------------------------------------------------------------------------------------------------      Component Value Date/Time   BNP 39.4 03/14/2020 0319    Micro Results No results found for this or any previous visit (from the past 240 hour(s)).  Radiology Reports DG Chest 1 View  Result Date: 03/17/2020 CLINICAL DATA:  Follow-up pneumothorax. EXAM: CHEST  1 VIEW COMPARISON:  Chest radiograph earlier this day and prior studies FINDINGS: A RIGHT thoracostomy tube is now noted with decrease in RIGHT pneumothorax, now small, approximately 15%. Diffuse bilateral airspace opacities are again noted. Cardiomegaly is unchanged. No other significant change identified. IMPRESSION: RIGHT thoracostomy tube placement with decreased RIGHT pneumothorax, now approximately 15%. Electronically Signed   By: Margarette Canada M.D.   On: 03/17/2020 09:36   CT CHEST WO CONTRAST  Result Date: 03/11/2020 CLINICAL DATA:  Pneumonia, unresolved. History of non-Hodgkin's lymphoma. Positive for coronavirus. EXAM: CT CHEST WITHOUT CONTRAST TECHNIQUE: Multidetector CT imaging of the chest was performed following the standard protocol without IV contrast. COMPARISON:  Chest radiograph 02/20/2020 FINDINGS: Cardiovascular: Normal heart size. No pericardial effusion. Aortic atherosclerosis. Coronary artery calcifications. Mediastinum/Nodes: No enlarged mediastinal or axillary lymph nodes. Thyroid gland, trachea, and esophagus demonstrate no significant findings. Lungs/Pleura: There is no pleural effusion. Extensive, bilateral pulmonary ground-glass opacification with patchy areas of nodular consolidation compatible with atypical viral pneumonia. There is a subpleural bleb identified within the posterior left lung base measuring 5.6 cm, image 145/6. Upper Abdomen: Multiple left lobe of liver cyst noted measuring up to 2.8 cm. No pain acute finding noted within the imaged portions of the upper abdomen. Musculoskeletal: No chest wall mass or suspicious bone lesions identified. IMPRESSION: 1. Extensive, bilateral pulmonary ground-glass  opacification with patchy areas of nodular consolidation compatible with atypical viral pneumonia. 2. Coronary artery calcifications noted. 3. Aortic atherosclerosis. Aortic Atherosclerosis (ICD10-I70.0). Electronically Signed   By: Kerby Moors M.D.   On: 03/11/2020 11:37   DG Chest Port 1 View  Result Date: 03/21/2020 CLINICAL DATA:  Shortness of breath and cough EXAM: PORTABLE CHEST 1 VIEW COMPARISON:  March 20, 2020 FINDINGS: Chest tube remains on the right, unchanged in position. Small right apical pneumothorax is stable. Ill-defined airspace opacity in the right mid lung laterally is stable. Somewhat more subtle ill-defined airspace opacity in the left upper lobe is likewise stable. There is also rather subtle ill-defined opacity in the right base region. No new opacity evident. Heart is upper normal in size with pulmonary vascularity normal. No adenopathy. No bone  lesion. IMPRESSION: Small right apical pneumothorax with chest tube in place on the right, stable. No tension component. Multifocal airspace opacity again noted, similar to 1 day prior. No new opacity. Stable cardiac silhouette. Electronically Signed   By: Lowella Grip III M.D.   On: 03/21/2020 08:10   DG Chest Port 1 View  Result Date: 03/18/2020 CLINICAL DATA:  COVID positive. Follow-up exam for respiratory opacities and right chest tube/pneumothorax. EXAM: PORTABLE CHEST 1 VIEW COMPARISON:  03/18/2020 at 12:05 a.m., and earlier studies. FINDINGS: Minimal right apical pneumothorax is stable from the earlier exam. Right chest tube is unchanged in position. Bilateral hazy airspace lung opacities are unchanged. No new lung abnormalities. IMPRESSION: 1. Minimal right apical pneumothorax is unchanged from the most recent prior exam. 2. Stable bilateral airspace lung opacities. Stable right-sided chest tube. Electronically Signed   By: Lajean Manes M.D.   On: 03/18/2020 10:34   DG CHEST PORT 1 VIEW  Result Date: 03/18/2020 CLINICAL  DATA:  Chest tube EXAM: PORTABLE CHEST 1 VIEW COMPARISON:  03/17/2020 FINDINGS: Right chest tube in place. Small residual right apical pneumothorax, decreased since prior study. Cardiomegaly. Diffuse airspace disease throughout the right lung. Patchy airspace disease on the left. IMPRESSION: Decreasing size of the right pneumothorax. Now small residual right apical pneumothorax remains. Bilateral airspace disease, right greater than left. Electronically Signed   By: Rolm Baptise M.D.   On: 03/18/2020 02:08   DG Chest Port 1 View  Result Date: 02/18/2020 CLINICAL DATA:  Short of breath EXAM: PORTABLE CHEST 1 VIEW COMPARISON:  03/05/2020 FINDINGS: Progression of right lower lobe airspace disease. Cardiac silhouette upper normal. Negative for heart failure. Left lung clear. Probable underlying COPD IMPRESSION: COPD.  Progressive right lower lobe infiltrate. Electronically Signed   By: Franchot Gallo M.D.   On: 02/22/2020 16:17   DG Chest Port 1 View  Result Date: 03/05/2020 CLINICAL DATA:  COVID-19. EXAM: PORTABLE CHEST 1 VIEW COMPARISON:  March 03, 2020 FINDINGS: New hazy infiltrates are seen in the lungs, particularly the upper lobes. The cardiomediastinal silhouette is stable. No pneumothorax. No other acute abnormalities. IMPRESSION: New hazy infiltrates, particularly in the upper lobes, worrisome for developing COVID-19 pneumonia given history. Electronically Signed   By: Dorise Bullion III M.D   On: 03/05/2020 08:38   DG Chest Port 1 View  Result Date: 03/03/2020 CLINICAL DATA:  COVID-19 diagnosed 2 weeks ago, increasing shortness of breath, hypoxemia EXAM: PORTABLE CHEST 1 VIEW COMPARISON:  None. FINDINGS: Single frontal view of the chest demonstrates an unremarkable cardiac silhouette. Diffuse interstitial prominence is seen, with faint ground-glass opacities within the mid and lower lung zones. No effusion or pneumothorax. No acute bony abnormality. IMPRESSION: 1. Interstitial prominence with  bilateral ground-glass airspace disease. Differential includes multifocal atypical pneumonia versus edema. Electronically Signed   By: Randa Ngo M.D.   On: 03/03/2020 15:43   DG Chest Port 1V same Day  Result Date: 03/20/2020 CLINICAL DATA:  COVID-19 positive, shortness of breath. EXAM: PORTABLE CHEST 1 VIEW COMPARISON:  March 19, 2020. FINDINGS: Stable cardiomediastinal silhouette. Stable position of right-sided chest tube with stable minimal right apical pneumothorax. Stable bilateral lung opacities are noted concerning for multifocal pneumonia. No pleural effusion is noted. Bony thorax is unremarkable. IMPRESSION: Stable position of right-sided chest tube with stable minimal right apical pneumothorax. Stable bilateral lung opacities are noted concerning for multifocal pneumonia. Electronically Signed   By: Marijo Conception M.D.   On: 03/20/2020 12:24   DG Chest [facility]  1V same Day  Result Date: 03/19/2020 CLINICAL DATA:  COVID-19 infection.  Follow-up exam. EXAM: PORTABLE CHEST 1 VIEW COMPARISON:  03/18/2020 and earlier studies. FINDINGS: Tiny right apical pneumothorax has decreased in size from the previous exam. Right chest tube is stable, lying along right lateral mid hemithorax. Interstitial and hazy airspace lung opacities are unchanged from the previous day's exam. No new lung abnormalities. IMPRESSION: 1. Right apical pneumothorax has decreased in size, only a tiny pneumothorax persisting at the apex. Stable right chest tube. 2. No change in the bilateral airspace lung opacities consistent with multifocal pneumonia. Electronically Signed   By: Lajean Manes M.D.   On: 03/19/2020 08:46   DG Chest Port 1V same Day  Result Date: 03/17/2020 CLINICAL DATA:  Shortness of breath. EXAM: PORTABLE CHEST 1 VIEW COMPARISON:  Chest CT 03/12/2019.  Chest x-ray 03/13/2020. FINDINGS: Large right pneumothorax is new in the interval. Diffuse interstitial and ground-glass airspace disease in the left lung is  progressive since chest x-ray of 02/20/2020. The cardio pericardial silhouette is enlarged. The visualized bony structures of the thorax show no acute abnormality. Telemetry leads overlie the chest. IMPRESSION: New large right pneumothorax. Interval progression of diffuse interstitial and alveolar opacity in the left lung. I personally called the results of this study at 0829 hours to the patient's nurse Greta, who took the report for Dr. Sloan Leiter, who was currently rounding. Electronically Signed   By: Misty Stanley M.D.   On: 03/17/2020 09:07   ECHOCARDIOGRAM LIMITED  Result Date: 03/11/2020    ECHOCARDIOGRAM LIMITED REPORT   Patient Name:   SOUA CALTAGIRONE Date of Exam: 03/11/2020 Medical Rec #:  [id number]       Height:       72.0 in Accession #:    [id number]      Weight:       305.7 lb Date of Birth:  [birth year]       BSA:          2.552 m Patient Age:    29 years        BP:           122/68 mmHg Patient Gender: M               HR:           85 bpm. Exam Location:  Inpatient Procedure: Limited Echo, Color Doppler and Cardiac Doppler Indications:   pulmonary hypertension 416.8  History:       Patient has no prior history of Echocardiogram examinations.                Covid.  Sonographer:   Johny Chess Referring      [id number] Lequita Halt Phys: IMPRESSIONS  1. Left ventricular ejection fraction, by estimation, is 55 to 60%. The left ventricle has normal function. The left ventricle has no regional wall motion abnormalities. Left ventricular diastolic parameters were normal.  2. Right ventricular systolic function is normal. The right ventricular size is mildly enlarged. Tricuspid regurgitation signal is inadequate for assessing PA pressure.  3. The aortic valve is tricuspid. Aortic valve regurgitation is not visualized. Mild aortic valve sclerosis is present, with no evidence of aortic valve stenosis.  4. The inferior vena cava is normal in size with greater than 50% respiratory variability, suggesting  right atrial pressure of 3 mmHg. FINDINGS  Left Ventricle: Left ventricular ejection fraction, by estimation, is 55 to 60%. The left ventricle has normal function. The left ventricle has no  regional wall motion abnormalities. The left ventricular internal cavity size was normal in size. There is  borderline left ventricular hypertrophy. Right Ventricle: The right ventricular size is mildly enlarged. No increase in right ventricular wall thickness. Right ventricular systolic function is normal. Tricuspid regurgitation signal is inadequate for assessing PA pressure. Pericardium: There is no evidence of pericardial effusion. Presence of pericardial fat pad. Tricuspid Valve: The tricuspid valve is grossly normal. Tricuspid valve regurgitation is trivial. Aortic Valve: The aortic valve is tricuspid. Aortic valve regurgitation is not visualized. Mild aortic valve sclerosis is present, with no evidence of aortic valve stenosis. Mild aortic valve annular calcification. Pulmonic Valve: The pulmonic valve was grossly normal. Pulmonic valve regurgitation is trivial. Venous: The inferior vena cava is normal in size with greater than 50% respiratory variability, suggesting right atrial pressure of 3 mmHg. IAS/Shunts: No atrial level shunt detected by color flow Doppler. LEFT VENTRICLE PLAX 2D LVIDd:         5.00 cm  Diastology LVIDs:         3.50 cm  LV e' lateral:   13.90 cm/s LV PW:         1.10 cm  LV E/e' lateral: 5.1 LV IVS:        1.00 cm  LV e' medial:    12.60 cm/s LVOT diam:     2.30 cm  LV E/e' medial:  5.6 LV SV:         85 LV SV Index:   33 LVOT Area:     4.15 cm  IVC IVC diam: 1.90 cm LEFT ATRIUM         Index LA diam:    4.00 cm 1.57 cm/m  AORTIC VALVE LVOT Vmax:   106.00 cm/s LVOT Vmean:  68.700 cm/s LVOT VTI:    0.205 m MITRAL VALVE MV Area (PHT): 4.60 cm    SHUNTS MV Decel Time: 165 msec    Systemic VTI:  0.20 m MV E velocity: 70.30 cm/s  Systemic Diam: 2.30 cm MV A velocity: 90.80 cm/s MV E/A ratio:  0.77  Rozann Lesches MD Electronically signed by Rozann Lesches MD Signature Date/Time: 03/11/2020/4:40:56 PM    Final

## 2020-03-21 NOTE — Progress Notes (Signed)
   03/20/20 2000  Assess: MEWS Score  Temp 98.5 F (36.9 C)  BP 117/71  Pulse Rate (!) 102  ECG Heart Rate (!) 102  Resp (!) 25  Level of Consciousness Alert  SpO2 91 %  O2 Device HFNC;Non-rebreather Mask  Patient Activity (if Appropriate) In bed  Heater temperature 89.6 F (32 C)  O2 Flow Rate (L/min) 35 L/min (+15L NRB)  FiO2 (%) 100 %  Assess: MEWS Score  MEWS Temp 0  MEWS Systolic 0  MEWS Pulse 1  MEWS RR 1  MEWS LOC 0  MEWS Score 2  MEWS Score Color Yellow  Assess: if the MEWS score is Yellow or Red  Were vital signs taken at a resting state? Yes  Focused Assessment No change from prior assessment  Early Detection of Sepsis Score *See Row Information* Low  MEWS guidelines implemented *See Row Information* Yes  Treat  MEWS Interventions Other (Comment) (begin yellow protocol)  Pain Scale 0-10  Pain Score 0  Take Vital Signs  Increase Vital Sign Frequency  Yellow: Q 2hr X 2 then Q 4hr X 2, if remains yellow, continue Q 4hrs  Escalate  MEWS: Escalate Yellow: discuss with charge nurse/RN and consider discussing with provider and RRT  Notify: Charge Nurse/RN  Name of Charge Nurse/RN Notified Carrie Mew, RN  Date Charge Nurse/RN Notified 03/20/20  Time Charge Nurse/RN Notified 2000  Notify: Provider  Provider Name/Title n/a  Notify: Rapid Response  Name of Rapid Response RN Notified n/a  Document  Patient Outcome Not stable and remains on department  Progress note created (see row info) Yes

## 2020-03-21 NOTE — Progress Notes (Signed)
   03/21/20 0348  Assess: MEWS Score  Temp 100.3 F (37.9 C)  BP 99/65  Pulse Rate (!) 111  ECG Heart Rate (!) 111  Resp (!) 27  Level of Consciousness Alert  SpO2 91 %  O2 Device HFNC;Non-rebreather Mask  Patient Activity (if Appropriate) In bed  Heater temperature 89.6 F (32 C)  O2 Flow Rate (L/min) 40 L/min  FiO2 (%) 100 %  Assess: MEWS Score  MEWS Temp 0  MEWS Systolic 1  MEWS Pulse 2  MEWS RR 2  MEWS LOC 0  MEWS Score 5  MEWS Score Color Red  Assess: if the MEWS score is Yellow or Red  Were vital signs taken at a resting state? Yes  Focused Assessment No change from prior assessment  Early Detection of Sepsis Score *See Row Information* Low  MEWS guidelines implemented *See Row Information* Yes  Treat  MEWS Interventions Escalated (See documentation below)  Take Vital Signs  Increase Vital Sign Frequency  Red: Q 1hr X 4 then Q 4hr X 4, if remains red, continue Q 4hrs  Escalate  MEWS: Escalate Red: discuss with charge nurse/RN and provider, consider discussing with RRT  Notify: Charge Nurse/RN  Name of Charge Nurse/RN Notified Carrie Mew, RN  Date Charge Nurse/RN Notified 03/21/20  Time Charge Nurse/RN Notified 3845  Notify: Provider  Provider Name/Title Ander Slade, DO  Date Provider Notified 03/21/20  Time Provider Notified [phone]  Notification Type Page  Notification Reason Change in status  Response See new orders  Date of Provider Response 03/21/20  Time of Provider Response 0420  Notify: Rapid Response  Name of Rapid Response RN Notified Anselm Pancoast, RN  Date Rapid Response Notified 03/21/20  Time Rapid Response Notified 8032  Document  Progress note created (see row info) Yes   Pt fired red MEWS score d/t tachypnea, SBP, and pulse rate.  Notified Rapid Response RN of pt's tenuous status.  Paged [facility] on-call provider Ander Slade, who ordered PRN inhalers, IV morphine, and 500cc NS bolus.

## 2020-03-21 NOTE — Significant Event (Signed)
Rapid Response Event Note   Reason for Call :  Acute oxygen desaturation to the mid 70s while on 40L/100% HHFNC & 100% NRB.   Initial Focused Assessment:  Pt lying in bed. Mildly tachypneic. Pt oxygen saturation improving to 89-92%. Lung sounds are diminished. Extremities are warm. Pt denies pain, denies chest tightness. Pt endorses having felt anxious, but is feeling better now.   VS: T 97.73F, BP 112/64, HR 103, RR 24, SpO2 86% on 50L/100% HHFNC & 100% NRB   Interventions:  -50L/100% HHFNC & 100% NRB  Plan of Care:  -Encourage proning and pulmonary hygiene as tolerated -Allow pt time to recover and wean supplemental oxygen as tolerated  Call rapid response for additional needs  Event Summary:  MD Notified: Notified by primary RN. Call Time: [place] Time: 1700 End Time: [place], RN

## 2020-03-21 NOTE — Progress Notes (Signed)
RT note. Pt. Refusing CPAP tonight. Pt on 45L, 100% and NRB. Pt. Resting comfortable, RT will continue to monitor.

## 2020-03-21 NOTE — Progress Notes (Addendum)
Pt currently has yellow MEWS, mews was previously yellow so VS will be Q4. VSS, no other needs at this time, will CTM.

## 2020-03-22 ENCOUNTER — Inpatient Hospital Stay (HOSPITAL_COMMUNITY): Payer: Medicare Other

## 2020-03-22 LAB — COMPREHENSIVE METABOLIC PANEL
ALT: 43 U/L (ref 0–44)
AST: 26 U/L (ref 15–41)
Albumin: 2.6 g/dL — ABNORMAL LOW (ref 3.5–5.0)
Alkaline Phosphatase: 86 U/L (ref 38–126)
Anion gap: 15 (ref 5–15)
BUN: 25 mg/dL — ABNORMAL HIGH (ref 8–23)
CO2: 25 mmol/L (ref 22–32)
Calcium: 8.5 mg/dL — ABNORMAL LOW (ref 8.9–10.3)
Chloride: 102 mmol/L (ref 98–111)
Creatinine, Ser: 0.95 mg/dL (ref 0.61–1.24)
GFR calc Af Amer: >60 mL/min (ref >60)
GFR calc non Af Amer: >60 mL/min (ref >60)
Glucose, Bld: 132 mg/dL — ABNORMAL HIGH (ref 70–99)
Potassium: 4.3 mmol/L (ref 3.5–5.1)
Sodium: 142 mmol/L (ref 135–145)
Total Bilirubin: 1.6 mg/dL — ABNORMAL HIGH (ref 0.3–1.2)
Total Protein: 5.8 g/dL — ABNORMAL LOW (ref 6.5–8.1)

## 2020-03-22 LAB — CBC
HCT: 49.5 % (ref 39.0–52.0)
Hemoglobin: 15.6 g/dL (ref 13.0–17.0)
MCH: 28.8 pg (ref 26.0–34.0)
MCHC: 31.5 g/dL (ref 30.0–36.0)
MCV: 91.5 fL (ref 80.0–100.0)
Platelets: 230 10*3/uL (ref 150–400)
RBC: 5.41 MIL/uL (ref 4.22–5.81)
RDW: 14.7 % (ref 11.5–15.5)
WBC: 20.1 10*3/uL — ABNORMAL HIGH (ref 4.0–10.5)
nRBC: 0 % (ref 0.0–0.2)

## 2020-03-22 LAB — GLUCOSE, CAPILLARY
Glucose-Capillary: 108 mg/dL — ABNORMAL HIGH (ref 70–99)
Glucose-Capillary: 132 mg/dL — ABNORMAL HIGH (ref 70–99)
Glucose-Capillary: 141 mg/dL — ABNORMAL HIGH (ref 70–99)
Glucose-Capillary: 158 mg/dL — ABNORMAL HIGH (ref 70–99)
Glucose-Capillary: 177 mg/dL — ABNORMAL HIGH (ref 70–99)

## 2020-03-22 LAB — D-DIMER, QUANTITATIVE: D-Dimer, Quant: 1.86 ug/mL-FEU — ABNORMAL HIGH (ref 0.00–0.50)

## 2020-03-22 LAB — C-REACTIVE PROTEIN: CRP: 11 mg/dL — ABNORMAL HIGH (ref <1.0)

## 2020-03-22 MED ORDER — CLONAZEPAM 0.5 MG PO TABS
0.5000 mg | ORAL_TABLET | Freq: Three times a day (TID) | ORAL | Status: DC | PRN
  Administered 2020-03-22 – 2020-03-23 (×3): 0.5 mg via ORAL
  Filled 2020-03-22 (×3): qty 1

## 2020-03-22 MED ORDER — OXYMETAZOLINE HCL 0.05 % NA SOLN
2.0000 | Freq: Two times a day (BID) | NASAL | Status: DC
  Administered 2020-03-22 – 2020-03-24 (×4): 2 via NASAL
  Filled 2020-03-22: qty 30

## 2020-03-22 MED ORDER — MORPHINE SULFATE (PF) 2 MG/ML IV SOLN
2.0000 mg | Freq: Once | INTRAVENOUS | Status: AC
  Administered 2020-03-22: 2 mg via INTRAVENOUS
  Filled 2020-03-22: qty 1

## 2020-03-22 MED ORDER — MIDODRINE HCL 5 MG PO TABS
5.0000 mg | ORAL_TABLET | Freq: Two times a day (BID) | ORAL | Status: DC
  Administered 2020-03-22 – 2020-03-24 (×4): 5 mg via ORAL
  Filled 2020-03-22 (×4): qty 1

## 2020-03-22 MED ORDER — METHYLPREDNISOLONE SODIUM SUCC 125 MG IJ SOLR
60.0000 mg | Freq: Two times a day (BID) | INTRAMUSCULAR | Status: DC
  Administered 2020-03-22 – 2020-03-24 (×5): 60 mg via INTRAVENOUS
  Filled 2020-03-22 (×5): qty 2

## 2020-03-22 MED ORDER — MORPHINE SULFATE (CONCENTRATE) 10 MG/0.5ML PO SOLN
5.0000 mg | ORAL | Status: DC | PRN
  Administered 2020-03-23 – 2020-03-24 (×2): 5 mg via ORAL
  Filled 2020-03-22 (×2): qty 0.5

## 2020-03-22 NOTE — Progress Notes (Signed)
Pt is resting with eyes closed, so signs of distress at this moment, VS per flowsheet, MEWS red, previous score was red, flowsheet followed, vital signs currently Q4, will follow MEWs protocol. Pt currently on heated high flow and non rebreather, was able to decrease oxygen to 40L 100%. Will continue to monitor.

## 2020-03-22 NOTE — TOC Progression Note (Signed)
Transition of Care [facility]) - Progression Note    Patient Details  Name: Michael Wolf MRN: [id number] Date of Birth: 10/07/1955  Transition of Care [facility]) CM/SW B and E, RN Phone Number: 03/22/2020, 4:17 PM  Clinical Narrative:    Readmission, continues on very high flow oxygen.  Is active with HH has Oxygen at home set up already. Will continue to follow for needs.    Expected Discharge Plan: [place] Barriers to Discharge: Continued Medical Work up  Expected Discharge Plan and Services Expected Discharge Plan: [place]   Discharge Planning Services: CM Consult   Living arrangements for the past 2 months: Single Family Home                                   Representative spoke with at [place]: Still active with advance patient was readmitted before they could see him   Social Determinants of Health (SDOH) Interventions    Readmission Risk Interventions No flowsheet data found.

## 2020-03-22 NOTE — Progress Notes (Signed)
Pt called out stating he was having trouble breathing, repositioned pt and suggested that pt prone, or lay on their side, pt declined and stated that he "needs something for his nerves". Paged MD and made them aware of pt request, MD placed orders. Pts O2 maintaining at mid-low 80s on 45L 100% heated high flow and nonrebreather. RT at bedside, pt now on 55L HHF and NRB, oxygen saturation maintaining 90%. Morning labs collected at this time.  0345- one time dose of morphine given as ordered, see MAR.   0400- lab called and stated that blood sample for D dimer was insufficient, pt refused to have labs redrawn at this time, will make AM nurse aware.

## 2020-03-22 NOTE — Progress Notes (Signed)
Patient refused CPAP for the night. Patient requiring high levels oxygen at this time.

## 2020-03-22 NOTE — Progress Notes (Signed)
Pt is AOx4. VS per flowsheet. Denied pain. Complained of SOB. On Tele. On HFNC and Non-rebreather and Chest tube. Pt has been very fatigued due to the inability to fall asleep during the night. Pt did score red on the MEWS today. MD was made aware. Charge was made aware. No new orders. See flowsheet for full assessment. Safety maintained. Call light within reach. Will continue to monitor.

## 2020-03-22 NOTE — Progress Notes (Signed)
Physical Therapy Treatment Patient Details Name: Michael Wolf MRN: [id number] DOB: [birth year] Today's Date: 03/22/2020    History of Present Illness Pt is a 64 y.o. male recently d/c home (03/09/20) on 2-4L O2 from COVID [facility] admission, now readmitted 03/11/2020 with worsening SOB and cough, found to have significant hypoxia requiring 15L HFNC + 15L NRB. CXR 9/3 with large R pneumothorax s/p chest tube insertion. PMH includes HTN, HLD, non-Hodgkin's lymphoma in remission, morbid obesity. Pt vaccinated.    PT Comments    Patient now on [place] 45L FiO2 90% plus NRB 15L with RR 30, sats 94% and HR 113 on arrival. Patient initially stating he could not do therapy due to feeling like he's not able to catch his breath. Patient slouched down in the bed and agreed to work with PT and hospital bed positioning to get him in more upright sitting posture. (See below for details and pt participation). After repositioned pt refuses additional activity or exercises. Attempted to review breathing in through his nose and pursing lips to blow out. Patient reporting he cannot breath that way and continued with mouth-breathing. Patient becoming more anxious and session concluded.    Follow Up Recommendations  Other (comment) (pt medically declined; will continue to assess)     Equipment Recommendations   (TBD)    Recommendations for Other Services       Precautions / Restrictions Precautions Precautions: Other (comment) Precaution Comments: chest tube; watch SpO2, quick to desaturate and prolonged recovery time     Mobility  Bed Mobility Overal bed mobility: Needs Assistance             General bed mobility comments: pt supine, slid down into slouched posture. Reported he could not exert himself at all as he feels like he is getting no air. Sats 94% on HHFNC 45L 90%, HR 113, RR 30. Agreed to attempt repositioning to improve his upright posture and decr WOB. Initially use trendelenburg with HOB elevated  to slant middle portion of bed backwards and allow pt to scoot his hip towards HOB. Then utilized bed controls to achieve semi-chair like position. Pt then able to use bil UEs to pull forward to place 3 pillows behind his back to improve his upright posture. Patient desaturated to 90% with max RR 38 with this activity. After ~2 minutes pt sats 96%, HR 112, RR 30.   Transfers                    Ambulation/Gait                 Stairs             Wheelchair Mobility    Modified Rankin (Stroke Patients Only)       Balance                                            Cognition Arousal/Alertness: Awake/alert (fatigued) Behavior During Therapy: Anxious Overall Cognitive Status: Within Functional Limits for tasks assessed                                        Exercises      General Comments        Pertinent Vitals/Pain Pain Assessment: Faces Faces Pain Scale: Hurts a little  bit Pain Location: Generalized Pain Descriptors / Indicators: Tiring Pain Intervention(s): Limited activity within patient's tolerance;Monitored during session    Home Living                      Prior Function            PT Goals (current goals can now be found in the care plan section) Acute Rehab PT Goals Patient Stated Goal: "I hope I can beat this thing" Time For Goal Achievement: 03/26/20 Potential to Achieve Goals: Good Progress towards PT goals: Not progressing toward goals - comment (medical decline; anxiety)    Frequency    Min 3X/week      PT Plan Discharge plan needs to be updated    Co-evaluation              AM-PAC PT "6 Clicks" Mobility   Outcome Measure  Help needed turning from your back to your side while in a flat bed without using bedrails?: A Little Help needed moving from lying on your back to sitting on the side of a flat bed without using bedrails?: A Lot Help needed moving to and from a bed  to a chair (including a wheelchair)?: A Lot Help needed standing up from a chair using your arms (e.g., wheelchair or bedside chair)?: A Lot Help needed to walk in hospital room?: Total Help needed climbing 3-5 steps with a railing? : Total 6 Click Score: 11    End of Session Equipment Utilized During Treatment: Oxygen Activity Tolerance: Patient limited by fatigue;Treatment limited secondary to medical complications (Comment) (anxiety re: his breathing) Patient left: with call bell/phone within reach;in bed (semi-chair position)   PT Visit Diagnosis: Difficulty in walking, not elsewhere classified (R26.2);Muscle weakness (generalized) (M62.81)     Time: 1624-4695 PT Time Calculation (min) (ACUTE ONLY): 26 min  Charges:  $Therapeutic Activity: 23-37 mins                      Arby Barrette, PT Pager [phone]    Rexanne Mano 03/22/2020, 1:39 PM

## 2020-03-22 NOTE — Progress Notes (Signed)
PROGRESS NOTE                                                                                                                                                                                                             Patient Demographics:    Michael Wolf, is a 64 y.o. male, DOB - 06-29-1956, RSW:[id number]  Outpatient Primary MD for the patient is [name], MD   Admit date - 03/11/2020   LOS - 12  Chief Complaint  Patient presents with  . Shortness of Breath       Brief Narrative: Patient is a 64 y.o. male with PMHx of HTN, HLD, DM-2, non-Hodgkin's lymphoma in remission, OSA on CPAP, morbid obesity-recent breakthrough COVID-19 PNA with hypoxemia-discharged home on 8/26 with home O2-presented back to the ED on 8/27 with significant worsening of hypoxemia.  Subsequently admitted to the hospitalist service-hospital course further complicated by right-sided pneumothorax requiring placement of chest tube, and A. fib with RVR.    COVID-19 vaccinated status: Vaccinated  Significant Events: 8/20-8/26>> hospitalized for hypoxia due to COVID-19-discharged on home O2-2 L 8/27>> readmitted for worsening hypoxemia-now requiring 15 L of HFNC. 9/3>> chest x-ray with large right pneumothorax-PCCM consulted for chest tube placement 9/3-9/4>> atrial fib with RVR-spontaneously converted to sinus rhythm on 9/4  Significant studies: 8/27>>Chest x-ray: COPD, progressive right lower lobe infiltrate. 8/28>> CT chest: Extensive bilateral pulmonary groundglass opacification 8/28>> Echo: EF 55-60% 9/3>> chest x-ray: Large right pneumothorax 9/5>> chest x-ray: Right apical pneumothorax has decreased in size, stable right chest tube-no change in bilateral airspace disease  COVID-19 medications: Steroids: 8/20>> Remdesivir: 8/20>> 8/24 Baricitinib: 8/20>> 8/26, 8/28>>9/3  Antibiotics: None  Microbiology data: 8/27 >>blood  culture: Negative  Procedures: 9/3>> chest tube insertion (PCCM-Dr. Tamala Julian)  Consults: None  DVT prophylaxis: Therapeutic Lovenox    Subjective:   Continues to complain of nasal congestion-has had episodes of shortness of breath/anxiety overnight.   Assessment  & Plan :   Acute Hypoxic Resp Failure due to Covid 19 Viral pneumonia: Continues to have severe hypoxemia-with no signs of clinical improvement as of yet-he is now starting to develop more anxiety and more air hunger at times.  This morning he appeared comfortable. No evidence of volume overload (more than 14 L negative balance).  CRP now trending up-remains on IV steroids.  He remains very tenuous-with potential  to decline further.  Have changed dosing of Klonopin-have added prn Roxanol for air hunger/shortness of breath and anxiety resistant to Klonopin.    Fever: afebrile O2 requirements:  SpO2: 96 % O2 Flow Rate (L/min): 50 L/min (+ 15L NRB) FiO2 (%): 100 %   COVID-19 Labs: Recent Labs    03/20/20 0252 03/21/20 0352 03/22/20 0415 03/22/20 0548  DDIMER 1.14* 1.77*  --  1.86*  FERRITIN 1,272* 1,348*  --   --   CRP 2.5* 6.1* 11.0*  --        Component Value Date/Time   BNP 39.4 03/14/2020 0319    Recent Labs  Lab 03/21/20 0352  PROCALCITON <0.10    Lab Results  Component Value Date   SARSCOV2NAA POSITIVE (A) 03/03/2020     Prone/Incentive Spirometry: encouraged  incentive spirometry use 3-4/hour.  Large right pneumothorax: Secondary to COVID-19-PCCM consulted-chest tube placed on 9/3.  Chest tube care deferred to PCCM.  A. fib with RVR: Occurred on 9/3 evening--managed with beta-blocker and 1 dose of digoxin-spontaneously converted to sinus rhythm on 9/4-remains in sinus rhythm on 9/6.  On therapeutic Lovenox given  CHA2DS2-VASc score of around 2-3.  Recent echo with preserved EF.   Leukocytosis: Likely secondary to steroids-repeat procalcitonin on 9/7 negative.  No indication to start  antibiotics.  HTN: Had brief episode of hypotension yesterday-subsequently started on midodrine-remains on metoprolol for atrial fibrillation.  Now BP has improved-we will start titrating down midodrine-changed to twice daily dosing today.   HLD: Continue statin/Zetia.  DM-2 (A1c 6.9 on 03/08/2020): CBG stable-continue SSI  Recent Labs    03/22/20 0031 03/22/20 0756 03/22/20 1211  GLUCAP 108* 132* 158*   History of diffuse large B-cell lymphoma:  in remission-follows with oncology at [facility] is to resume usual outpatient follow-up post discharge.  OSA: Unable to tolerate CPAP due to severe hypoxemia-resume when able  Palliative care discussion: Remains very tenuous-has severe parenchymal lung injury due to COVID-19-complicated by right-sided pneumothorax requiring chest tube.  Have had extensive discussion with both patient and spouse over the phone over the past few days-goals of care to continue with full scope of medical treatment but short of intubation-he is a DNR.  Plans are to monitor closely-if he decompensates significantly-then apart from transitioning to comfort care really no other options.  He has had some issues with anxiety and shortness of breath at times-have adjusted Klonopin dosing-and have added as needed Roxanol as well.   Morbid Obesity: Estimated body mass index is 40.36 kg/m as calculated from the following:   Height as of this encounter: 6' (1.829 m).   Weight as of this encounter: 135 kg.    ABG: No results found for: PHART, PCO2ART, PO2ART, HCO3, TCO2, ACIDBASEDEF, O2SAT  Vent Settings: N/A  Condition -  Guarded  Family Communication  :  Spouse Joaquim Lai [phone]) updated over the phone 9/8-given clinical decline-have discussed visitation issues with spouse (she had asked a few days back)  Code Status :  DNR  Diet :  Diet Order            Diet Heart Room service appropriate? Yes; Fluid consistency: Thin  Diet effective now                   Disposition Plan  :   Status is: Inpatient  Remains inpatient appropriate because:Inpatient level of care appropriate due to severity of illness   Dispo: The patient is from: Home  Anticipated d/c is to: Home              Anticipated d/c date is: > 3 days              Patient currently is not medically stable to d/c.   Barriers to discharge: Hypoxia requiring O2 supplementation  Antimicorbials  :    Anti-infectives (From admission, onward)   None      Inpatient Medications  Scheduled Meds: . enoxaparin (LOVENOX) injection  120 mg Subcutaneous Q12H  . ezetimibe  10 mg Oral Daily  . fluticasone  2 spray Each Nare Daily  . gabapentin  300 mg Oral Q12H  . insulin aspart  0-15 Units Subcutaneous TID WC  . Ipratropium-Albuterol  1 puff Inhalation QID  . loratadine  10 mg Oral Daily  . mouth rinse  15 mL Mouth Rinse BID  . methylPREDNISolone (SOLU-MEDROL) injection  60 mg Intravenous Q12H  . metoprolol tartrate  25 mg Oral BID  . midodrine  5 mg Oral BID WC  .  morphine injection  2 mg Intravenous Once  . oxymetazoline  2 spray Each Nare BID  . pantoprazole  40 mg Oral Daily  . rosuvastatin  40 mg Oral Daily  . senna-docusate  2 tablet Oral BID  . venlafaxine XR  75 mg Oral Q breakfast   Continuous Infusions: PRN Meds:.acetaminophen, albuterol, clonazePAM, metoprolol tartrate, morphine CONCENTRATE, oxyCODONE, polyethylene glycol, sodium chloride   Time Spent in minutes  35  See all Orders from today for further details   Oren Binet M.D on 03/22/2020 at 12:30 PM  To page go to [email or web address] - use universal password  [facility] -  Office  [phone]    Objective:   Vitals:   03/22/20 0300 03/22/20 0758 03/22/20 0828 03/22/20 1214  BP: 126/82 136/87 136/87 129/70  Pulse: (!) 103 (!) 110 (!) 103 (!) 117  Resp: (!) 26 (!) 24 (!) 23 (!) 26  Temp: 99.6 F (37.6 C) (!) 97.3 F (36.3 C)  98.2 F (36.8 C)  TempSrc: Oral Axillary   Axillary  SpO2: 95% 97% 98% 96%  Weight:      Height:        Wt Readings from Last 3 Encounters:  03/20/20 135 kg  03/03/20 136.1 kg  12/01/19 (!) 149.5 kg     Intake/Output Summary (Last 24 hours) at 03/22/2020 1230 Last data filed at 03/22/2020 1222 Gross per 24 hour  Intake --  Output 2012 ml  Net -2012 ml   Gen Exam:Alert awake-not in any distress HEENT:atraumatic, normocephalic Chest: B/L clear to auscultation anteriorly CVS:S1S2 regular Abdomen:soft non tender, non distended Extremities:no edema Neurology: Non focal Skin: no rash   Data Review:    CBC Recent Labs  Lab 03/17/20 0143 03/18/20 0505 03/19/20 0254 03/20/20 0252 03/22/20 0415  WBC 15.6* 14.1* 14.4* 18.6* 20.1*  HGB 14.4 13.5 13.8 14.7 15.6  HCT 44.2 41.9 42.8 46.0 49.5  PLT 272 246 217 210 230  MCV 87.9 89.5 90.9 89.5 91.5  MCH 28.6 28.8 29.3 28.6 28.8  MCHC 32.6 32.2 32.2 32.0 31.5  RDW 14.1 14.2 14.4 14.3 14.7    Chemistries  Recent Labs  Lab 03/18/20 0505 03/19/20 0254 03/20/20 0252 03/21/20 0352 03/22/20 0415  NA 139 141 136 140 142  K 3.9 4.0 4.1 4.2 4.3  CL 97* 99 97* 101 102  CO2 33* 32 28 24 25   GLUCOSE 153* 153* 133* 122* 132*  BUN 23 23 23  24*  25*  CREATININE 0.96 0.87 0.88 0.94 0.95  CALCIUM 8.2* 8.3* 8.4* 8.5* 8.5*  AST 28 29 32 30 26  ALT 46* 49* 53* 48* 43  ALKPHOS 63 64 74 79 86  BILITOT 1.2 1.0 1.2 1.3* 1.6*   ------------------------------------------------------------------------------------------------------------------ No results for input(s): CHOL, HDL, LDLCALC, TRIG, CHOLHDL, LDLDIRECT in the last 72 hours.  Lab Results  Component Value Date   HGBA1C 6.9 (H) 03/08/2020   ------------------------------------------------------------------------------------------------------------------ No results for input(s): TSH, T4TOTAL, T3FREE, THYROIDAB in the last 72 hours.  Invalid input(s):  FREET3 ------------------------------------------------------------------------------------------------------------------ Recent Labs    03/20/20 0252 03/21/20 0352  FERRITIN 1,272* 1,348*    Coagulation profile No results for input(s): INR, PROTIME in the last 168 hours.  Recent Labs    03/21/20 0352 03/22/20 0548  DDIMER 1.77* 1.86*    Cardiac Enzymes No results for input(s): CKMB, TROPONINI, MYOGLOBIN in the last 168 hours.  Invalid input(s): CK ------------------------------------------------------------------------------------------------------------------    Component Value Date/Time   BNP 39.4 03/14/2020 0319    Micro Results No results found for this or any previous visit (from the past 240 hour(s)).  Radiology Reports DG Chest 1 View  Result Date: 03/17/2020 CLINICAL DATA:  Follow-up pneumothorax. EXAM: CHEST  1 VIEW COMPARISON:  Chest radiograph earlier this day and prior studies FINDINGS: A RIGHT thoracostomy tube is now noted with decrease in RIGHT pneumothorax, now small, approximately 15%. Diffuse bilateral airspace opacities are again noted. Cardiomegaly is unchanged. No other significant change identified. IMPRESSION: RIGHT thoracostomy tube placement with decreased RIGHT pneumothorax, now approximately 15%. Electronically Signed   By: Margarette Canada M.D.   On: 03/17/2020 09:36   CT CHEST WO CONTRAST  Result Date: 03/11/2020 CLINICAL DATA:  Pneumonia, unresolved. History of non-Hodgkin's lymphoma. Positive for coronavirus. EXAM: CT CHEST WITHOUT CONTRAST TECHNIQUE: Multidetector CT imaging of the chest was performed following the standard protocol without IV contrast. COMPARISON:  Chest radiograph 03/05/2020 FINDINGS: Cardiovascular: Normal heart size. No pericardial effusion. Aortic atherosclerosis. Coronary artery calcifications. Mediastinum/Nodes: No enlarged mediastinal or axillary lymph nodes. Thyroid gland, trachea, and esophagus demonstrate no significant  findings. Lungs/Pleura: There is no pleural effusion. Extensive, bilateral pulmonary ground-glass opacification with patchy areas of nodular consolidation compatible with atypical viral pneumonia. There is a subpleural bleb identified within the posterior left lung base measuring 5.6 cm, image 145/6. Upper Abdomen: Multiple left lobe of liver cyst noted measuring up to 2.8 cm. No pain acute finding noted within the imaged portions of the upper abdomen. Musculoskeletal: No chest wall mass or suspicious bone lesions identified. IMPRESSION: 1. Extensive, bilateral pulmonary ground-glass opacification with patchy areas of nodular consolidation compatible with atypical viral pneumonia. 2. Coronary artery calcifications noted. 3. Aortic atherosclerosis. Aortic Atherosclerosis (ICD10-I70.0). Electronically Signed   By: Kerby Moors M.D.   On: 03/11/2020 11:37   DG Chest Port 1 View  Result Date: 03/22/2020 CLINICAL DATA:  64 year old male positive COVID-19. Shortness of breath. EXAM: PORTABLE CHEST 1 VIEW COMPARISON:  Portable chest 03/21/2020 and earlier. FINDINGS: Portable AP upright view at 0646 hours. Pigtail right chest tube remains in place. No pneumothorax. Continued low lung volumes with diffuse, coarse bilateral pulmonary interstitial opacity. Mediastinal contours remain within normal limits. Visualized tracheal air column is within normal limits. No definite pleural effusion. Ventilation is stable since yesterday. The degree of interstitial opacity has mildly progressed compared to 03/20/2020. Negative visible bowel gas pattern. IMPRESSION: 1. Stable right chest tube. No pneumothorax visible today. 2. Bilateral COVID-19 pneumonia with stable ventilation from yesterday,  mildly progressed diffuse interstitial opacity since 03/20/2020. Electronically Signed   By: Genevie Ann M.D.   On: 03/22/2020 07:55   DG Chest Port 1 View  Result Date: 03/21/2020 CLINICAL DATA:  Shortness of breath and cough EXAM: PORTABLE  CHEST 1 VIEW COMPARISON:  March 20, 2020 FINDINGS: Chest tube remains on the right, unchanged in position. Small right apical pneumothorax is stable. Ill-defined airspace opacity in the right mid lung laterally is stable. Somewhat more subtle ill-defined airspace opacity in the left upper lobe is likewise stable. There is also rather subtle ill-defined opacity in the right base region. No new opacity evident. Heart is upper normal in size with pulmonary vascularity normal. No adenopathy. No bone lesion. IMPRESSION: Small right apical pneumothorax with chest tube in place on the right, stable. No tension component. Multifocal airspace opacity again noted, similar to 1 day prior. No new opacity. Stable cardiac silhouette. Electronically Signed   By: Lowella Grip III M.D.   On: 03/21/2020 08:10   DG Chest Port 1 View  Result Date: 03/18/2020 CLINICAL DATA:  COVID positive. Follow-up exam for respiratory opacities and right chest tube/pneumothorax. EXAM: PORTABLE CHEST 1 VIEW COMPARISON:  03/18/2020 at 12:05 a.m., and earlier studies. FINDINGS: Minimal right apical pneumothorax is stable from the earlier exam. Right chest tube is unchanged in position. Bilateral hazy airspace lung opacities are unchanged. No new lung abnormalities. IMPRESSION: 1. Minimal right apical pneumothorax is unchanged from the most recent prior exam. 2. Stable bilateral airspace lung opacities. Stable right-sided chest tube. Electronically Signed   By: Lajean Manes M.D.   On: 03/18/2020 10:34   DG CHEST PORT 1 VIEW  Result Date: 03/18/2020 CLINICAL DATA:  Chest tube EXAM: PORTABLE CHEST 1 VIEW COMPARISON:  03/17/2020 FINDINGS: Right chest tube in place. Small residual right apical pneumothorax, decreased since prior study. Cardiomegaly. Diffuse airspace disease throughout the right lung. Patchy airspace disease on the left. IMPRESSION: Decreasing size of the right pneumothorax. Now small residual right apical pneumothorax remains.  Bilateral airspace disease, right greater than left. Electronically Signed   By: Rolm Baptise M.D.   On: 03/18/2020 02:08   DG Chest Port 1 View  Result Date: 02/16/2020 CLINICAL DATA:  Short of breath EXAM: PORTABLE CHEST 1 VIEW COMPARISON:  03/05/2020 FINDINGS: Progression of right lower lobe airspace disease. Cardiac silhouette upper normal. Negative for heart failure. Left lung clear. Probable underlying COPD IMPRESSION: COPD.  Progressive right lower lobe infiltrate. Electronically Signed   By: Franchot Gallo M.D.   On: 02/15/2020 16:17   DG Chest Port 1 View  Result Date: 03/05/2020 CLINICAL DATA:  COVID-19. EXAM: PORTABLE CHEST 1 VIEW COMPARISON:  March 03, 2020 FINDINGS: New hazy infiltrates are seen in the lungs, particularly the upper lobes. The cardiomediastinal silhouette is stable. No pneumothorax. No other acute abnormalities. IMPRESSION: New hazy infiltrates, particularly in the upper lobes, worrisome for developing COVID-19 pneumonia given history. Electronically Signed   By: Dorise Bullion III M.D   On: 03/05/2020 08:38   DG Chest Port 1 View  Result Date: 03/03/2020 CLINICAL DATA:  COVID-19 diagnosed 2 weeks ago, increasing shortness of breath, hypoxemia EXAM: PORTABLE CHEST 1 VIEW COMPARISON:  None. FINDINGS: Single frontal view of the chest demonstrates an unremarkable cardiac silhouette. Diffuse interstitial prominence is seen, with faint ground-glass opacities within the mid and lower lung zones. No effusion or pneumothorax. No acute bony abnormality. IMPRESSION: 1. Interstitial prominence with bilateral ground-glass airspace disease. Differential includes multifocal atypical pneumonia versus edema. Electronically  Signed   By: Randa Ngo M.D.   On: 03/03/2020 15:43   DG Chest Port 1V same Day  Result Date: 03/20/2020 CLINICAL DATA:  COVID-19 positive, shortness of breath. EXAM: PORTABLE CHEST 1 VIEW COMPARISON:  March 19, 2020. FINDINGS: Stable cardiomediastinal  silhouette. Stable position of right-sided chest tube with stable minimal right apical pneumothorax. Stable bilateral lung opacities are noted concerning for multifocal pneumonia. No pleural effusion is noted. Bony thorax is unremarkable. IMPRESSION: Stable position of right-sided chest tube with stable minimal right apical pneumothorax. Stable bilateral lung opacities are noted concerning for multifocal pneumonia. Electronically Signed   By: Marijo Conception M.D.   On: 03/20/2020 12:24   DG Chest Port 1V same Day  Result Date: 03/19/2020 CLINICAL DATA:  COVID-19 infection.  Follow-up exam. EXAM: PORTABLE CHEST 1 VIEW COMPARISON:  03/18/2020 and earlier studies. FINDINGS: Tiny right apical pneumothorax has decreased in size from the previous exam. Right chest tube is stable, lying along right lateral mid hemithorax. Interstitial and hazy airspace lung opacities are unchanged from the previous day's exam. No new lung abnormalities. IMPRESSION: 1. Right apical pneumothorax has decreased in size, only a tiny pneumothorax persisting at the apex. Stable right chest tube. 2. No change in the bilateral airspace lung opacities consistent with multifocal pneumonia. Electronically Signed   By: Lajean Manes M.D.   On: 03/19/2020 08:46   DG Chest Port 1V same Day  Result Date: 03/17/2020 CLINICAL DATA:  Shortness of breath. EXAM: PORTABLE CHEST 1 VIEW COMPARISON:  Chest CT 03/12/2019.  Chest x-ray 02/17/2020. FINDINGS: Large right pneumothorax is new in the interval. Diffuse interstitial and ground-glass airspace disease in the left lung is progressive since chest x-ray of 02/29/2020. The cardio pericardial silhouette is enlarged. The visualized bony structures of the thorax show no acute abnormality. Telemetry leads overlie the chest. IMPRESSION: New large right pneumothorax. Interval progression of diffuse interstitial and alveolar opacity in the left lung. I personally called the results of this study at 0829 hours to  the patient's nurse Greta, who took the report for Dr. Sloan Leiter, who was currently rounding. Electronically Signed   By: Misty Stanley M.D.   On: 03/17/2020 09:07   ECHOCARDIOGRAM LIMITED  Result Date: 03/11/2020    ECHOCARDIOGRAM LIMITED REPORT   Patient Name:   KASPAR ALBORNOZ Date of Exam: 03/11/2020 Medical Rec #:  [id number]       Height:       72.0 in Accession #:    [id number]      Weight:       305.7 lb Date of Birth:  04-04-1956       BSA:          2.552 m Patient Age:    52 years        BP:           122/68 mmHg Patient Gender: M               HR:           85 bpm. Exam Location:  Inpatient Procedure: Limited Echo, Color Doppler and Cardiac Doppler Indications:   pulmonary hypertension 416.8  History:       Patient has no prior history of Echocardiogram examinations.                Covid.  Sonographer:   Johny Chess Referring      [id number] Lequita Halt Phys: IMPRESSIONS  1. Left ventricular ejection fraction, by estimation, is 55  to 60%. The left ventricle has normal function. The left ventricle has no regional wall motion abnormalities. Left ventricular diastolic parameters were normal.  2. Right ventricular systolic function is normal. The right ventricular size is mildly enlarged. Tricuspid regurgitation signal is inadequate for assessing PA pressure.  3. The aortic valve is tricuspid. Aortic valve regurgitation is not visualized. Mild aortic valve sclerosis is present, with no evidence of aortic valve stenosis.  4. The inferior vena cava is normal in size with greater than 50% respiratory variability, suggesting right atrial pressure of 3 mmHg. FINDINGS  Left Ventricle: Left ventricular ejection fraction, by estimation, is 55 to 60%. The left ventricle has normal function. The left ventricle has no regional wall motion abnormalities. The left ventricular internal cavity size was normal in size. There is  borderline left ventricular hypertrophy. Right Ventricle: The right ventricular size is  mildly enlarged. No increase in right ventricular wall thickness. Right ventricular systolic function is normal. Tricuspid regurgitation signal is inadequate for assessing PA pressure. Pericardium: There is no evidence of pericardial effusion. Presence of pericardial fat pad. Tricuspid Valve: The tricuspid valve is grossly normal. Tricuspid valve regurgitation is trivial. Aortic Valve: The aortic valve is tricuspid. Aortic valve regurgitation is not visualized. Mild aortic valve sclerosis is present, with no evidence of aortic valve stenosis. Mild aortic valve annular calcification. Pulmonic Valve: The pulmonic valve was grossly normal. Pulmonic valve regurgitation is trivial. Venous: The inferior vena cava is normal in size with greater than 50% respiratory variability, suggesting right atrial pressure of 3 mmHg. IAS/Shunts: No atrial level shunt detected by color flow Doppler. LEFT VENTRICLE PLAX 2D LVIDd:         5.00 cm  Diastology LVIDs:         3.50 cm  LV e' lateral:   13.90 cm/s LV PW:         1.10 cm  LV E/e' lateral: 5.1 LV IVS:        1.00 cm  LV e' medial:    12.60 cm/s LVOT diam:     2.30 cm  LV E/e' medial:  5.6 LV SV:         85 LV SV Index:   33 LVOT Area:     4.15 cm  IVC IVC diam: 1.90 cm LEFT ATRIUM         Index LA diam:    4.00 cm 1.57 cm/m  AORTIC VALVE LVOT Vmax:   106.00 cm/s LVOT Vmean:  68.700 cm/s LVOT VTI:    0.205 m MITRAL VALVE MV Area (PHT): 4.60 cm    SHUNTS MV Decel Time: 165 msec    Systemic VTI:  0.20 m MV E velocity: 70.30 cm/s  Systemic Diam: 2.30 cm MV A velocity: 90.80 cm/s MV E/A ratio:  0.77 Rozann Lesches MD Electronically signed by Rozann Lesches MD Signature Date/Time: 03/11/2020/4:40:56 PM    Final

## 2020-03-23 ENCOUNTER — Inpatient Hospital Stay (HOSPITAL_COMMUNITY): Payer: Medicare Other

## 2020-03-23 DIAGNOSIS — J939 Pneumothorax, unspecified: Secondary | ICD-10-CM

## 2020-03-23 LAB — C-REACTIVE PROTEIN: CRP: 12.4 mg/dL — ABNORMAL HIGH (ref <1.0)

## 2020-03-23 LAB — COMPREHENSIVE METABOLIC PANEL
ALT: 38 U/L (ref 0–44)
AST: 24 U/L (ref 15–41)
Albumin: 2.4 g/dL — ABNORMAL LOW (ref 3.5–5.0)
Alkaline Phosphatase: 89 U/L (ref 38–126)
Anion gap: 15 (ref 5–15)
BUN: 37 mg/dL — ABNORMAL HIGH (ref 8–23)
CO2: 24 mmol/L (ref 22–32)
Calcium: 8.7 mg/dL — ABNORMAL LOW (ref 8.9–10.3)
Chloride: 107 mmol/L (ref 98–111)
Creatinine, Ser: 1.03 mg/dL (ref 0.61–1.24)
GFR calc Af Amer: >60 mL/min (ref >60)
GFR calc non Af Amer: >60 mL/min (ref >60)
Glucose, Bld: 175 mg/dL — ABNORMAL HIGH (ref 70–99)
Potassium: 4.3 mmol/L (ref 3.5–5.1)
Sodium: 146 mmol/L — ABNORMAL HIGH (ref 135–145)
Total Bilirubin: 1.4 mg/dL — ABNORMAL HIGH (ref 0.3–1.2)
Total Protein: 5.9 g/dL — ABNORMAL LOW (ref 6.5–8.1)

## 2020-03-23 LAB — CBC
HCT: 48.7 % (ref 39.0–52.0)
Hemoglobin: 15.3 g/dL (ref 13.0–17.0)
MCH: 28.4 pg (ref 26.0–34.0)
MCHC: 31.4 g/dL (ref 30.0–36.0)
MCV: 90.5 fL (ref 80.0–100.0)
Platelets: 202 10*3/uL (ref 150–400)
RBC: 5.38 MIL/uL (ref 4.22–5.81)
RDW: 15 % (ref 11.5–15.5)
WBC: 16.5 10*3/uL — ABNORMAL HIGH (ref 4.0–10.5)
nRBC: 0 % (ref 0.0–0.2)

## 2020-03-23 LAB — GLUCOSE, CAPILLARY
Glucose-Capillary: 155 mg/dL — ABNORMAL HIGH (ref 70–99)
Glucose-Capillary: 162 mg/dL — ABNORMAL HIGH (ref 70–99)
Glucose-Capillary: 163 mg/dL — ABNORMAL HIGH (ref 70–99)
Glucose-Capillary: 145 mg/dL — ABNORMAL HIGH (ref 70–99)

## 2020-03-23 LAB — HEPARIN ANTI-XA: Heparin LMW: 0.71 IU/mL

## 2020-03-23 LAB — D-DIMER, QUANTITATIVE: D-Dimer, Quant: 1.27 ug/mL-FEU — ABNORMAL HIGH (ref 0.00–0.50)

## 2020-03-23 NOTE — Progress Notes (Signed)
[facility] for Lovenox Indication: atrial fibrillation  Assessment: 64 years of age male with COVID and large right pneumothorax status post chest tube placement on 9/3 AM now with atrial fibrillation. Pharmacy consulted to increase Lovenox therapy to full dose for atrial fibrillation.   Anti-Xa level 0.71 (drawn ~6 hours after dose)  Goal of Therapy:  Anti-Xa level 0.6-1 units/ml 4hrs after LMWH dose given  Monitor platelets by anticoagulation protocol: Yes   Plan:  Lovenox at 120 mg sq Q 12 hours tonight Follow CBC  Thanks for allowing pharmacy to be a part of this patient's care.  Excell Seltzer, PharmD Clinical Pharmacist  03/23/2020,4:27 AM

## 2020-03-23 NOTE — Progress Notes (Signed)
CCMD called and stated that patients O2 was in the 40s, immediately went to bedside, upon entering pts room all oxygen had been taken off of patient, O2 saturation was 31%,  pt was very lethargic, speech was incomprehensible, and pt was gasping for air. Heated high flow and nonrebreather placed back on patient, pts oxygen levels quickly recovered and is now 96% on 45L 100%, and non rebreather. Pt stated that his oxygen mask and canula had fell off and he was too weak to place it back or to press his call light for help. VSS, no other needs at this time, call light in hand, will CTM.

## 2020-03-23 NOTE — Progress Notes (Signed)
NAME:  Michael Wolf, MRN:  [id number], DOB:  Dec 22, 1955, LOS: 5 ADMISSION DATE:  03/13/2020, CONSULTATION DATE:  03/17/20 REFERRING MD:  Oren Binet  CHIEF COMPLAINT:  SOB   Brief History   64 year old man presenting with severe COVID pneumonia complicated by R sided pneumothorax. Pigtail was placed bt PCCM on 9/3   Past Medical History   Past Medical History:  Diagnosis Date  . Cancer ([place])   . High cholesterol   . Hypertension   . Non Hodgkin's lymphoma ([place])   . Obesity   . Sleep apnea      Significant Hospital Events   8/20-8/26 initial [place] admission 8/27 readmission  Consults:  PCCM  Procedures:  9/3 Pigtail chest tube  Significant Diagnostic Tests:  9/3CXR large R PTX 9/5 CXR persistent small PTX Micro Data:  COVID +  Antimicrobials:  N/a   Interim history/subjective:  Chest tube remain in place, still showing air bubbles with each breath, consistent with bronchopleural fistula Patient respiratory status has not been improving  Objective   Blood pressure 128/88, pulse (!) 105, temperature 98 F (36.7 C), temperature source Axillary, resp. rate (!) 25, height 6' (1.829 m), weight 128.9 kg, SpO2 (!) 89 %.    FiO2 (%):  [100 %] 100 %   Intake/Output Summary (Last 24 hours) at 03/23/2020 1217 Last data filed at 03/23/2020 1108 Gross per 24 hour  Intake 50 ml  Output 1925 ml  Net -1875 ml   Filed Weights   03/19/20 0500 03/20/20 0500 03/23/20 0420  Weight: 132.7 kg 135 kg 128.9 kg    Examination: General: Chronically ill appearing morbidly obese male, lying in bed HENT: [place]/NT, oral mucosa is moist Lungs: Course breath sounds b/l, no wheezes or rhonchi.  Right-sided lateral pigtail chest tube is in place Cardiovascular: Regular rate and rhythm, no murmur Abdomen: protuberant, hypoactive BS Extremities: trace edema Neuro: Alert, awake and oriented x3.  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 At this time no intervention needed  Rest of the management per primary team PCCM will follow along.  Best practice:  Diet: per primary Pain/Anxiety/Delirium protocol (if indicated): per primary VAP protocol (if indicated): N/A DVT prophylaxis: per primary GI prophylaxis: per primary Glucose control: per primary Mobility: per primary Code Status: per primary Family Communication: per primary Disposition: 5w  Labs   CBC: Recent Labs  Lab 03/18/20 0505 03/19/20 0254 03/20/20 0252 03/22/20 0415 03/23/20 0332  WBC 14.1* 14.4* 18.6* 20.1* 16.5*  HGB 13.5 13.8 14.7 15.6 15.3  HCT 41.9 42.8 46.0 49.5 48.7  MCV 89.5 90.9 89.5 91.5 90.5  PLT 246 217 210 230 810    Basic Metabolic Panel: Recent Labs  Lab 03/19/20 0254 03/20/20 0252 03/21/20 0352 03/22/20 0415 03/23/20 0332  NA 141 136 140 142 146*  K 4.0 4.1 4.2 4.3 4.3  CL 99 97* 101 102 107  CO2 32 28 24 25 24   GLUCOSE 153* 133* 122* 132* 175*  BUN 23 23 24* 25* 37*  CREATININE 0.87 0.88 0.94 0.95 1.03  CALCIUM 8.3* 8.4* 8.5* 8.5* 8.7*   GFR: Estimated Creatinine Clearance: 99.2 mL/min (by C-G formula based on SCr of 1.03 mg/dL). Recent Labs  Lab 03/19/20 0254 03/20/20 0252 03/21/20 0352 03/22/20 0415 03/23/20 0332  PROCALCITON  --   --  <0.10  --   --   WBC 14.4* 18.6*  --  20.1* 16.5*    Liver Function Tests: Recent Labs  Lab 03/19/20 0254 03/20/20 0252 03/21/20 0352 03/22/20 0415 03/23/20 0332  AST 29 32 30 26 24   ALT 49* 53* 48* 43 38  ALKPHOS 64 74 79 86 89    BILITOT 1.0 1.2 1.3* 1.6* 1.4*  PROT 5.3* 5.6* 5.7* 5.8* 5.9*  ALBUMIN 2.5* 2.6* 2.5* 2.6* 2.4*   No results for input(s): LIPASE, AMYLASE in the last 168 hours. No results for input(s): AMMONIA in the last 168 hours.  ABG No results found for: PHART, PCO2ART, PO2ART, HCO3, TCO2, ACIDBASEDEF, O2SAT   Coagulation Profile: No results for input(s): INR, PROTIME in the last 168 hours.  Cardiac Enzymes: No results for input(s): CKTOTAL, CKMB, CKMBINDEX, TROPONINI in the last 168 hours.  HbA1C: Hgb A1c MFr Bld  Date/Time Value Ref Range Status  03/08/2020 05:00 AM 6.9 (H) 4.8 - 5.6 % Final    Comment:    (NOTE) Pre diabetes:          5.7%-6.4%  Diabetes:              >6.4%  Glycemic control for   <7.0% adults with diabetes     CBG: Recent Labs  Lab 03/22/20 1211 03/22/20 1618 03/22/20 2029 03/23/20 0745 03/23/20 1202  GLUCAP 158* 141* 177* 155* 162*    Review of Systems:    Positive Symptoms in bold:  Constitutional fevers, chills, weight loss, fatigue, anorexia, malaise  Eyes decreased vision, double vision, eye irritation  Ears, Nose, Mouth, Throat sore throat, trouble swallowing, sinus congestion  Cardiovascular chest pain, paroxysmal nocturnal dyspnea, lower ext edema, palpitations   Respiratory SOB, cough, DOE, hemoptysis, wheezing  Gastrointestinal nausea, vomiting, diarrhea  Genitourinary burning with urination, trouble urinating  Musculoskeletal joint aches, joint swelling, back pain  Integumentary  rashes, skin lesions  Neurological focal weakness, focal numbness, trouble speaking, headaches  Psychiatric depression, anxiety, confusion  Endocrine polyuria, polydipsia, cold intolerance, heat intolerance  Hematologic abnormal bruising, abnormal bleeding, unexplained nose bleeds  Allergic/Immunologic recurrent infections, hives, swollen lymph nodes     Past Medical History  He,  has a past medical history of Cancer ([place]), High cholesterol,  Hypertension, Non Hodgkin's lymphoma ([place]), Obesity, and Sleep apnea.   Surgical History    Past Surgical History:  Procedure Laterality Date  . HIP SURGERY       Social History   reports that he has never smoked. He has never used smokeless tobacco. He reports that he does not drink alcohol and does not use drugs.   Family History   His family history is not on file.   Allergies Allergies  Allergen Reactions  . Sulfur Other (See Comments)    Thirsty, doesn't tolerate well per spouse     Home Medications  Prior to Admission medications   Medication Sig Start Date End Date Taking? Authorizing Provider  cetirizine (ZYRTEC) 10 MG tablet Take 10 mg by mouth daily.   Yes [provider]  clonazePAM (KLONOPIN) 1 MG tablet Take 0.5-1 mg by mouth at bedtime. 01/08/20  Yes [provider]  desvenlafaxine (PRISTIQ) 50 MG 24 hr tablet Take 50 mg  by mouth daily.   Yes [provider]  dexamethasone (DECADRON) 6 MG tablet Take 1 tablet (6 mg total) by mouth daily. 03/05/2020  Yes Elgergawy, Silver Huguenin, MD  ezetimibe (ZETIA) 10 MG tablet Take 10 mg by mouth daily.   Yes [provider]  gabapentin (NEURONTIN) 300 MG capsule Take 300 mg by mouth 3 times/day as needed-between meals & bedtime (pain).  12/17/19  Yes [provider]  metFORMIN (GLUCOPHAGE) 500 MG tablet Take 1 tablet (500 mg total) by mouth 2 (two) times daily with a meal. 03/09/20 03/09/21 Yes Elgergawy, Silver Huguenin, MD  pantoprazole (PROTONIX) 40 MG tablet Take 1 tablet (40 mg total) by mouth daily. 03/09/20 04/08/20 Yes Elgergawy, Silver Huguenin, MD  rosuvastatin (CRESTOR) 40 MG tablet Take 40 mg by mouth daily.   Yes [provider]  triamterene-hydrochlorothiazide (MAXZIDE-25) 37.5-25 MG tablet Take 1 tablet by mouth daily. PLEASE START ON 8/29 Patient taking differently: Take 1 tablet by mouth daily.  03/12/20  Yes Elgergawy, Silver Huguenin, MD     Jacky Kindle MD Critical care physician [facility]  Pager: [phone] Mobile: [phone]

## 2020-03-23 NOTE — Progress Notes (Signed)
PROGRESS NOTE                                                                                                                                                                                                             Patient Demographics:    Michael Wolf, is a 64 y.o. male, DOB - 01-26-1956, JJK:[id number]  Outpatient Primary MD for the patient is Michael Petty, MD   Admit date - 02/28/2020   LOS - 15  Chief Complaint  Patient presents with  . Shortness of Breath       Brief Narrative: Patient is a 64 y.o. male with PMHx of HTN, HLD, DM-2, non-Hodgkin's lymphoma in remission, OSA on CPAP, morbid obesity-recent breakthrough COVID-19 PNA with hypoxemia-discharged home on 8/26 with home O2-presented back to the ED on 8/27 with significant worsening of hypoxemia.  Subsequently admitted to the hospitalist service-hospital course further complicated by right-sided pneumothorax requiring placement of chest tube, and A. fib with RVR.    COVID-19 vaccinated status: Vaccinated  Significant Events: 8/20-8/26>> hospitalized for hypoxia due to COVID-19-discharged on home O2-2 L 8/27>> readmitted for worsening hypoxemia-now requiring 15 L of HFNC. 9/3>> chest x-ray with large right pneumothorax-PCCM consulted for chest tube placement 9/3-9/4>> atrial fib with RVR-spontaneously converted to sinus rhythm on 9/4  Significant studies: 8/27>>Chest x-ray: COPD, progressive right lower lobe infiltrate. 8/28>> CT chest: Extensive bilateral pulmonary groundglass opacification 8/28>> Echo: EF 55-60% 9/3>> chest x-ray: Large right pneumothorax 9/5>> chest x-ray: Right apical pneumothorax has decreased in size, stable right chest tube-no change in bilateral airspace disease  COVID-19 medications: Steroids: 8/20>> Remdesivir: 8/20>> 8/24 Baricitinib: 8/20>> 8/26, 8/28>>9/3  Antibiotics: None  Microbiology data: 8/27 >>blood  culture: Negative  Procedures: 9/3>> chest tube insertion (PCCM-Dr. Tamala Julian)  Consults: None  DVT prophylaxis: Therapeutic Lovenox    Subjective:   Feels tired-but no major issues overnight.  Remains on heated high flow and NRB.   Assessment  & Plan :   Acute Hypoxic Resp Failure due to Covid 19 Viral pneumonia: Continues to have severe hypoxemia-on heated high flow and NRB-CRP continues to trend up-he has no signs of clinical improvement as of yet.  Some amount of anxiety-better controlled on as needed Klonopin this morning.  Although severely hypoxemic-he appears relatively well comfortable this morning.  He remains on IV steroids-dosing has been recently escalated due  to uptrending CRP.  He has no signs of volume overload-he is more than 16 L negative balance.  Fever: afebrile O2 requirements:  SpO2: (!) 89 % O2 Flow Rate (L/min): 40 L/min FiO2 (%): 100 %   COVID-19 Labs: Recent Labs    03/21/20 0352 03/22/20 0415 03/22/20 0548 03/23/20 0332  DDIMER 1.77*  --  1.86* 1.27*  FERRITIN 1,348*  --   --   --   CRP 6.1* 11.0*  --  12.4*       Component Value Date/Time   BNP 39.4 03/14/2020 0319    Recent Labs  Lab 03/21/20 0352  PROCALCITON <0.10    Lab Results  Component Value Date   SARSCOV2NAA POSITIVE (A) 03/03/2020     Prone/Incentive Spirometry: encouraged  incentive spirometry use 3-4/hour.  Large right pneumothorax: Secondary to COVID-19-PCCM consulted-chest tube placed on 9/3.  Chest tube care deferred to PCCM.  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  A. fib with RVR: Occurred on 9/3 evening--managed with beta-blocker and 1 dose of digoxin-spontaneously converted to sinus rhythm on 9/4-remains in sinus rhythm on 9/6.  On therapeutic Lovenox given  CHA2DS2-VASc score of around 2-3.  Recent echo with preserved EF.   Leukocytosis: Likely secondary to steroids-repeat procalcitonin on 9/7 negative.  No indication to start  antibiotics.  HTN: Has had period of hypotension a few days back-on midodrine twice daily dosing.  Remains on metoprolol for atrial fibrillation.  Continue to monitor closely-continue to slowly titrate down dosing of midodrine.    HLD: Continue statin/Zetia  DM-2 (A1c 6.9 on 03/08/2020): CBG stable on SSI.  Follow and adjust.  Recent Labs    03/22/20 2029 03/23/20 0745 03/23/20 1202  GLUCAP 177* 155* 162*   History of diffuse large B-cell lymphoma:  in remission-follows with oncology at [facility] is to resume usual outpatient follow-up post discharge.  OSA: Unable to tolerate CPAP due to severe hypoxemia-resume when able  Palliative care discussion: DNR in place-patient remains very tenuous-continues to have severe hypoxemia requiring heated high flow and NRB complicated by right-sided pneumothorax requiring chest tube placement.  Unfortunately-no signs of any recovery as of yet.  Plans are to continue supportive care as long as he appears comfortable-but if he deteriorates-apart from transitioning to comfort measures-really no other options.     Morbid Obesity: Estimated body mass index is 38.54 kg/m as calculated from the following:   Height as of this encounter: 6' (1.829 m).   Weight as of this encounter: 128.9 kg.    ABG: No results found for: PHART, PCO2ART, PO2ART, HCO3, TCO2, ACIDBASEDEF, O2SAT  Vent Settings: N/A  Condition -  Guarded  Family Communication  :  Spouse Joaquim Wolf [phone]) updated over the phone 9/9  Code Status :  DNR  Diet :  Diet Order            Diet Heart Room service appropriate? Yes; Fluid consistency: Thin  Diet effective now                  Disposition Plan  :   Status is: Inpatient  Remains inpatient appropriate because:Inpatient level of care appropriate due to severity of illness   Dispo: The patient is from: Home              Anticipated d/c is to: Home              Anticipated d/c date is: > 3 days  Patient  currently is not medically stable to d/c.   Barriers to discharge: Hypoxia requiring O2 supplementation  Antimicorbials  :    Anti-infectives (From admission, onward)   None      Inpatient Medications  Scheduled Meds: . enoxaparin (LOVENOX) injection  120 mg Subcutaneous Q12H  . ezetimibe  10 mg Oral Daily  . fluticasone  2 spray Each Nare Daily  . gabapentin  300 mg Oral Q12H  . insulin aspart  0-15 Units Subcutaneous TID WC  . Ipratropium-Albuterol  1 puff Inhalation QID  . loratadine  10 mg Oral Daily  . mouth rinse  15 mL Mouth Rinse BID  . methylPREDNISolone (SOLU-MEDROL) injection  60 mg Intravenous Q12H  . metoprolol tartrate  25 mg Oral BID  . midodrine  5 mg Oral BID WC  .  morphine injection  2 mg Intravenous Once  . oxymetazoline  2 spray Each Nare BID  . pantoprazole  40 mg Oral Daily  . rosuvastatin  40 mg Oral Daily  . senna-docusate  2 tablet Oral BID  . venlafaxine XR  75 mg Oral Q breakfast   Continuous Infusions: PRN Meds:.acetaminophen, albuterol, clonazePAM, metoprolol tartrate, morphine CONCENTRATE, oxyCODONE, polyethylene glycol, sodium chloride   Time Spent in minutes  35  See all Orders from today for further details   Oren Binet M.D on 03/23/2020 at 2:18 PM  To page go to [email or web address] - use universal password  [facility] -  Office  [phone]    Objective:   Vitals:   03/23/20 0800 03/23/20 1002 03/23/20 1145 03/23/20 1156  BP: 118/75 118/81 118/81 128/88  Pulse: (!) 115 (!) 102 (!) 109 (!) 105  Resp: (!) 24 (!) 25 (!) 24 (!) 25  Temp: 99 F (37.2 C) 98.3 F (36.8 C)  98 F (36.7 C)  TempSrc: Axillary Axillary  Axillary  SpO2: 96% 96% 94% (!) 89%  Weight:      Height:        Wt Readings from Last 3 Encounters:  03/23/20 128.9 kg  03/03/20 136.1 kg  12/01/19 (!) 149.5 kg     Intake/Output Summary (Last 24 hours) at 03/23/2020 1418 Last data filed at 03/23/2020 1108 Gross per 24 hour  Intake 50 ml  Output  1275 ml  Net -1225 ml   Gen Exam:Alert awake-not in any distress HEENT:atraumatic, normocephalic Chest: B/L clear to auscultation anteriorly CVS:S1S2 regular Abdomen:soft non tender, non distended Extremities:no edema Neurology: Non focal Skin: no rash   Data Review:    CBC Recent Labs  Lab 03/18/20 0505 03/19/20 0254 03/20/20 0252 03/22/20 0415 03/23/20 0332  WBC 14.1* 14.4* 18.6* 20.1* 16.5*  HGB 13.5 13.8 14.7 15.6 15.3  HCT 41.9 42.8 46.0 49.5 48.7  PLT 246 217 210 230 202  MCV 89.5 90.9 89.5 91.5 90.5  MCH 28.8 29.3 28.6 28.8 28.4  MCHC 32.2 32.2 32.0 31.5 31.4  RDW 14.2 14.4 14.3 14.7 15.0    Chemistries  Recent Labs  Lab 03/19/20 0254 03/20/20 0252 03/21/20 0352 03/22/20 0415 03/23/20 0332  NA 141 136 140 142 146*  K 4.0 4.1 4.2 4.3 4.3  CL 99 97* 101 102 107  CO2 32 28 24 25 24   GLUCOSE 153* 133* 122* 132* 175*  BUN 23 23 24* 25* 37*  CREATININE 0.87 0.88 0.94 0.95 1.03  CALCIUM 8.3* 8.4* 8.5* 8.5* 8.7*  AST 29 32 30 26 24   ALT 49* 53* 48* 43 38  ALKPHOS 64 74 79 86 89  BILITOT 1.0 1.2 1.3* 1.6* 1.4*   ------------------------------------------------------------------------------------------------------------------ No results for input(s): CHOL, HDL, LDLCALC, TRIG, CHOLHDL, LDLDIRECT in the last 72 hours.  Lab Results  Component Value Date   HGBA1C 6.9 (H) 03/08/2020   ------------------------------------------------------------------------------------------------------------------ No results for input(s): TSH, T4TOTAL, T3FREE, THYROIDAB in the last 72 hours.  Invalid input(s): FREET3 ------------------------------------------------------------------------------------------------------------------ Recent Labs    03/21/20 0352  FERRITIN 1,348*    Coagulation profile No results for input(s): INR, PROTIME in the last 168 hours.  Recent Labs    03/22/20 0548 03/23/20 0332  DDIMER 1.86* 1.27*    Cardiac Enzymes No results for  input(s): CKMB, TROPONINI, MYOGLOBIN in the last 168 hours.  Invalid input(s): CK ------------------------------------------------------------------------------------------------------------------    Component Value Date/Time   BNP 39.4 03/14/2020 0319    Micro Results No results found for this or any previous visit (from the past 240 hour(s)).  Radiology Reports DG Chest 1 View  Result Date: 03/17/2020 CLINICAL DATA:  Follow-up pneumothorax. EXAM: CHEST  1 VIEW COMPARISON:  Chest radiograph earlier this day and prior studies FINDINGS: A RIGHT thoracostomy tube is now noted with decrease in RIGHT pneumothorax, now small, approximately 15%. Diffuse bilateral airspace opacities are again noted. Cardiomegaly is unchanged. No other significant change identified. IMPRESSION: RIGHT thoracostomy tube placement with decreased RIGHT pneumothorax, now approximately 15%. Electronically Signed   By: Margarette Canada M.D.   On: 03/17/2020 09:36   CT CHEST WO CONTRAST  Result Date: 03/11/2020 CLINICAL DATA:  Pneumonia, unresolved. History of non-Hodgkin's lymphoma. Positive for coronavirus. EXAM: CT CHEST WITHOUT CONTRAST TECHNIQUE: Multidetector CT imaging of the chest was performed following the standard protocol without IV contrast. COMPARISON:  Chest radiograph 03/03/2020 FINDINGS: Cardiovascular: Normal heart size. No pericardial effusion. Aortic atherosclerosis. Coronary artery calcifications. Mediastinum/Nodes: No enlarged mediastinal or axillary lymph nodes. Thyroid gland, trachea, and esophagus demonstrate no significant findings. Lungs/Pleura: There is no pleural effusion. Extensive, bilateral pulmonary ground-glass opacification with patchy areas of nodular consolidation compatible with atypical viral pneumonia. There is a subpleural bleb identified within the posterior left lung base measuring 5.6 cm, image 145/6. Upper Abdomen: Multiple left lobe of liver cyst noted measuring up to 2.8 cm. No pain acute  finding noted within the imaged portions of the upper abdomen. Musculoskeletal: No chest wall mass or suspicious bone lesions identified. IMPRESSION: 1. Extensive, bilateral pulmonary ground-glass opacification with patchy areas of nodular consolidation compatible with atypical viral pneumonia. 2. Coronary artery calcifications noted. 3. Aortic atherosclerosis. Aortic Atherosclerosis (ICD10-I70.0). Electronically Signed   By: Kerby Moors M.D.   On: 03/11/2020 11:37   DG Chest Port 1 View  Result Date: 03/22/2020 CLINICAL DATA:  64 year old male positive COVID-19. Shortness of breath. EXAM: PORTABLE CHEST 1 VIEW COMPARISON:  Portable chest 03/21/2020 and earlier. FINDINGS: Portable AP upright view at 0646 hours. Pigtail right chest tube remains in place. No pneumothorax. Continued low lung volumes with diffuse, coarse bilateral pulmonary interstitial opacity. Mediastinal contours remain within normal limits. Visualized tracheal air column is within normal limits. No definite pleural effusion. Ventilation is stable since yesterday. The degree of interstitial opacity has mildly progressed compared to 03/20/2020. Negative visible bowel gas pattern. IMPRESSION: 1. Stable right chest tube. No pneumothorax visible today. 2. Bilateral COVID-19 pneumonia with stable ventilation from yesterday, mildly progressed diffuse interstitial opacity since 03/20/2020. Electronically Signed   By: Genevie Ann M.D.   On: 03/22/2020 07:55   DG Chest Port 1 View  Result Date: 03/21/2020 CLINICAL DATA:  Shortness of breath and cough  EXAM: PORTABLE CHEST 1 VIEW COMPARISON:  March 20, 2020 FINDINGS: Chest tube remains on the right, unchanged in position. Small right apical pneumothorax is stable. Ill-defined airspace opacity in the right mid lung laterally is stable. Somewhat more subtle ill-defined airspace opacity in the left upper lobe is likewise stable. There is also rather subtle ill-defined opacity in the right base region. No  new opacity evident. Heart is upper normal in size with pulmonary vascularity normal. No adenopathy. No bone lesion. IMPRESSION: Small right apical pneumothorax with chest tube in place on the right, stable. No tension component. Multifocal airspace opacity again noted, similar to 1 day prior. No new opacity. Stable cardiac silhouette. Electronically Signed   By: Lowella Grip III M.D.   On: 03/21/2020 08:10   DG Chest Port 1 View  Result Date: 03/18/2020 CLINICAL DATA:  COVID positive. Follow-up exam for respiratory opacities and right chest tube/pneumothorax. EXAM: PORTABLE CHEST 1 VIEW COMPARISON:  03/18/2020 at 12:05 a.m., and earlier studies. FINDINGS: Minimal right apical pneumothorax is stable from the earlier exam. Right chest tube is unchanged in position. Bilateral hazy airspace lung opacities are unchanged. No new lung abnormalities. IMPRESSION: 1. Minimal right apical pneumothorax is unchanged from the most recent prior exam. 2. Stable bilateral airspace lung opacities. Stable right-sided chest tube. Electronically Signed   By: Lajean Manes M.D.   On: 03/18/2020 10:34   DG CHEST PORT 1 VIEW  Result Date: 03/18/2020 CLINICAL DATA:  Chest tube EXAM: PORTABLE CHEST 1 VIEW COMPARISON:  03/17/2020 FINDINGS: Right chest tube in place. Small residual right apical pneumothorax, decreased since prior study. Cardiomegaly. Diffuse airspace disease throughout the right lung. Patchy airspace disease on the left. IMPRESSION: Decreasing size of the right pneumothorax. Now small residual right apical pneumothorax remains. Bilateral airspace disease, right greater than left. Electronically Signed   By: Rolm Baptise M.D.   On: 03/18/2020 02:08   DG Chest Port 1 View  Result Date: 03/03/2020 CLINICAL DATA:  Short of breath EXAM: PORTABLE CHEST 1 VIEW COMPARISON:  03/05/2020 FINDINGS: Progression of right lower lobe airspace disease. Cardiac silhouette upper normal. Negative for heart failure. Left lung  clear. Probable underlying COPD IMPRESSION: COPD.  Progressive right lower lobe infiltrate. Electronically Signed   By: Franchot Gallo M.D.   On: 02/27/2020 16:17   DG Chest Port 1 View  Result Date: 03/05/2020 CLINICAL DATA:  COVID-19. EXAM: PORTABLE CHEST 1 VIEW COMPARISON:  March 03, 2020 FINDINGS: New hazy infiltrates are seen in the lungs, particularly the upper lobes. The cardiomediastinal silhouette is stable. No pneumothorax. No other acute abnormalities. IMPRESSION: New hazy infiltrates, particularly in the upper lobes, worrisome for developing COVID-19 pneumonia given history. Electronically Signed   By: Dorise Bullion III M.D   On: 03/05/2020 08:38   DG Chest Port 1 View  Result Date: 03/03/2020 CLINICAL DATA:  COVID-19 diagnosed 2 weeks ago, increasing shortness of breath, hypoxemia EXAM: PORTABLE CHEST 1 VIEW COMPARISON:  None. FINDINGS: Single frontal view of the chest demonstrates an unremarkable cardiac silhouette. Diffuse interstitial prominence is seen, with faint ground-glass opacities within the mid and lower lung zones. No effusion or pneumothorax. No acute bony abnormality. IMPRESSION: 1. Interstitial prominence with bilateral ground-glass airspace disease. Differential includes multifocal atypical pneumonia versus edema. Electronically Signed   By: Randa Ngo M.D.   On: 03/03/2020 15:43   DG Chest Port 1V same Day  Result Date: 03/23/2020 CLINICAL DATA:  Shortness of breath, COVID. EXAM: PORTABLE CHEST 1 VIEW COMPARISON:  Multiple  priors, most recent 03/22/2020 FINDINGS: Patient rotation. Similar appearance of low lung volumes with diffuse coarse bilateral interstitial thickening. More confluent airspace opacities in lung bases, similar to prior. Similar positioning of a right sided pigtail chest tube, which projects over the lateral right mid lung. Likely small right apical pneumothorax. No visible pleural effusions. Similar w mild enlargement the cardiac silhouette.  IMPRESSION: 1. Similar position of a right chest tube. Likely small right apical pneumothorax. 2. Similar multifocal interstitial and airspace opacities, compatible with multifocal pneumonia. Electronically Signed   By: Margaretha Sheffield MD   On: 03/23/2020 08:09   DG Chest Port 1V same Day  Result Date: 03/20/2020 CLINICAL DATA:  COVID-19 positive, shortness of breath. EXAM: PORTABLE CHEST 1 VIEW COMPARISON:  March 19, 2020. FINDINGS: Stable cardiomediastinal silhouette. Stable position of right-sided chest tube with stable minimal right apical pneumothorax. Stable bilateral lung opacities are noted concerning for multifocal pneumonia. No pleural effusion is noted. Bony thorax is unremarkable. IMPRESSION: Stable position of right-sided chest tube with stable minimal right apical pneumothorax. Stable bilateral lung opacities are noted concerning for multifocal pneumonia. Electronically Signed   By: Marijo Conception M.D.   On: 03/20/2020 12:24   DG Chest Port 1V same Day  Result Date: 03/19/2020 CLINICAL DATA:  COVID-19 infection.  Follow-up exam. EXAM: PORTABLE CHEST 1 VIEW COMPARISON:  03/18/2020 and earlier studies. FINDINGS: Tiny right apical pneumothorax has decreased in size from the previous exam. Right chest tube is stable, lying along right lateral mid hemithorax. Interstitial and hazy airspace lung opacities are unchanged from the previous day's exam. No new lung abnormalities. IMPRESSION: 1. Right apical pneumothorax has decreased in size, only a tiny pneumothorax persisting at the apex. Stable right chest tube. 2. No change in the bilateral airspace lung opacities consistent with multifocal pneumonia. Electronically Signed   By: Lajean Manes M.D.   On: 03/19/2020 08:46   DG Chest Port 1V same Day  Result Date: 03/17/2020 CLINICAL DATA:  Shortness of breath. EXAM: PORTABLE CHEST 1 VIEW COMPARISON:  Chest CT 03/12/2019.  Chest x-ray 02/22/2020. FINDINGS: Large right pneumothorax is new in the  interval. Diffuse interstitial and ground-glass airspace disease in the left lung is progressive since chest x-ray of 03/12/2020. The cardio pericardial silhouette is enlarged. The visualized bony structures of the thorax show no acute abnormality. Telemetry leads overlie the chest. IMPRESSION: New large right pneumothorax. Interval progression of diffuse interstitial and alveolar opacity in the left lung. I personally called the results of this study at 0829 hours to the patient's nurse Greta, who took the report for Dr. Sloan Leiter, who was currently rounding. Electronically Signed   By: Misty Stanley M.D.   On: 03/17/2020 09:07   ECHOCARDIOGRAM LIMITED  Result Date: 03/11/2020    ECHOCARDIOGRAM LIMITED REPORT   Patient Name:   KEMANI DEMARAIS Date of Exam: 03/11/2020 Medical Rec #:  [id number]       Height:       72.0 in Accession #:    [id number]      Weight:       305.7 lb Date of Birth:  21-Apr-1956       BSA:          2.552 m Patient Age:    24 years        BP:           122/68 mmHg Patient Gender: M               HR:  85 bpm. Exam Location:  Inpatient Procedure: Limited Echo, Color Doppler and Cardiac Doppler Indications:   pulmonary hypertension 416.8  History:       Patient has no prior history of Echocardiogram examinations.                Covid.  Sonographer:   Johny Chess Referring      [id number] Lequita Halt Phys: IMPRESSIONS  1. Left ventricular ejection fraction, by estimation, is 55 to 60%. The left ventricle has normal function. The left ventricle has no regional wall motion abnormalities. Left ventricular diastolic parameters were normal.  2. Right ventricular systolic function is normal. The right ventricular size is mildly enlarged. Tricuspid regurgitation signal is inadequate for assessing PA pressure.  3. The aortic valve is tricuspid. Aortic valve regurgitation is not visualized. Mild aortic valve sclerosis is present, with no evidence of aortic valve stenosis.  4. The inferior vena  cava is normal in size with greater than 50% respiratory variability, suggesting right atrial pressure of 3 mmHg. FINDINGS  Left Ventricle: Left ventricular ejection fraction, by estimation, is 55 to 60%. The left ventricle has normal function. The left ventricle has no regional wall motion abnormalities. The left ventricular internal cavity size was normal in size. There is  borderline left ventricular hypertrophy. Right Ventricle: The right ventricular size is mildly enlarged. No increase in right ventricular wall thickness. Right ventricular systolic function is normal. Tricuspid regurgitation signal is inadequate for assessing PA pressure. Pericardium: There is no evidence of pericardial effusion. Presence of pericardial fat pad. Tricuspid Valve: The tricuspid valve is grossly normal. Tricuspid valve regurgitation is trivial. Aortic Valve: The aortic valve is tricuspid. Aortic valve regurgitation is not visualized. Mild aortic valve sclerosis is present, with no evidence of aortic valve stenosis. Mild aortic valve annular calcification. Pulmonic Valve: The pulmonic valve was grossly normal. Pulmonic valve regurgitation is trivial. Venous: The inferior vena cava is normal in size with greater than 50% respiratory variability, suggesting right atrial pressure of 3 mmHg. IAS/Shunts: No atrial level shunt detected by color flow Doppler. LEFT VENTRICLE PLAX 2D LVIDd:         5.00 cm  Diastology LVIDs:         3.50 cm  LV e' lateral:   13.90 cm/s LV PW:         1.10 cm  LV E/e' lateral: 5.1 LV IVS:        1.00 cm  LV e' medial:    12.60 cm/s LVOT diam:     2.30 cm  LV E/e' medial:  5.6 LV SV:         85 LV SV Index:   33 LVOT Area:     4.15 cm  IVC IVC diam: 1.90 cm LEFT ATRIUM         Index LA diam:    4.00 cm 1.57 cm/m  AORTIC VALVE LVOT Vmax:   106.00 cm/s LVOT Vmean:  68.700 cm/s LVOT VTI:    0.205 m MITRAL VALVE MV Area (PHT): 4.60 cm    SHUNTS MV Decel Time: 165 msec    Systemic VTI:  0.20 m MV E velocity:  70.30 cm/s  Systemic Diam: 2.30 cm MV A velocity: 90.80 cm/s MV E/A ratio:  0.77 Rozann Lesches MD Electronically signed by Rozann Lesches MD Signature Date/Time: 03/11/2020/4:40:56 PM    Final

## 2020-03-24 ENCOUNTER — Inpatient Hospital Stay (HOSPITAL_COMMUNITY): Payer: Medicare Other

## 2020-03-24 LAB — COMPREHENSIVE METABOLIC PANEL
ALT: 36 U/L (ref 0–44)
AST: 27 U/L (ref 15–41)
Albumin: 2.4 g/dL — ABNORMAL LOW (ref 3.5–5.0)
Alkaline Phosphatase: 96 U/L (ref 38–126)
Anion gap: 16 — ABNORMAL HIGH (ref 5–15)
BUN: 48 mg/dL — ABNORMAL HIGH (ref 8–23)
CO2: 24 mmol/L (ref 22–32)
Calcium: 8.5 mg/dL — ABNORMAL LOW (ref 8.9–10.3)
Chloride: 107 mmol/L (ref 98–111)
Creatinine, Ser: 1.53 mg/dL — ABNORMAL HIGH (ref 0.61–1.24)
GFR calc Af Amer: 54 mL/min — ABNORMAL LOW (ref >60)
GFR calc non Af Amer: 47 mL/min — ABNORMAL LOW (ref >60)
Glucose, Bld: 177 mg/dL — ABNORMAL HIGH (ref 70–99)
Potassium: 4.3 mmol/L (ref 3.5–5.1)
Sodium: 147 mmol/L — ABNORMAL HIGH (ref 135–145)
Total Bilirubin: 1.8 mg/dL — ABNORMAL HIGH (ref 0.3–1.2)
Total Protein: 5.6 g/dL — ABNORMAL LOW (ref 6.5–8.1)

## 2020-03-24 LAB — D-DIMER, QUANTITATIVE: D-Dimer, Quant: 1.75 ug/mL-FEU — ABNORMAL HIGH (ref 0.00–0.50)

## 2020-03-24 LAB — BLOOD GAS, ARTERIAL
Acid-Base Excess: 5.1 mmol/L — ABNORMAL HIGH (ref 0.0–2.0)
Bicarbonate: 28.1 mmol/L — ABNORMAL HIGH (ref 20.0–28.0)
Drawn by: 33099
FIO2: 100
O2 Saturation: 78.3 %
Patient temperature: 37.6
pCO2 arterial: 35 mmHg (ref 32.0–48.0)
pH, Arterial: 7.517 — ABNORMAL HIGH (ref 7.350–7.450)
pO2, Arterial: 43.8 mmHg — ABNORMAL LOW (ref 83.0–108.0)

## 2020-03-24 LAB — CBC
HCT: 47.8 % (ref 39.0–52.0)
Hemoglobin: 15.2 g/dL (ref 13.0–17.0)
MCH: 29.1 pg (ref 26.0–34.0)
MCHC: 31.8 g/dL (ref 30.0–36.0)
MCV: 91.4 fL (ref 80.0–100.0)
Platelets: 168 10*3/uL (ref 150–400)
RBC: 5.23 MIL/uL (ref 4.22–5.81)
RDW: 15.2 % (ref 11.5–15.5)
WBC: 14.3 10*3/uL — ABNORMAL HIGH (ref 4.0–10.5)
nRBC: 0.1 % (ref 0.0–0.2)

## 2020-03-24 LAB — C-REACTIVE PROTEIN: CRP: 8.8 mg/dL — ABNORMAL HIGH (ref <1.0)

## 2020-03-24 LAB — GLUCOSE, CAPILLARY
Glucose-Capillary: 134 mg/dL — ABNORMAL HIGH (ref 70–99)
Glucose-Capillary: 179 mg/dL — ABNORMAL HIGH (ref 70–99)

## 2020-03-24 MED ORDER — MORPHINE BOLUS VIA INFUSION
2.0000 mg | Freq: Once | INTRAVENOUS | Status: AC
  Administered 2020-03-24: 2 mg via INTRAVENOUS
  Filled 2020-03-24: qty 2

## 2020-03-24 MED ORDER — ATROPINE SULFATE 1 % OP SOLN
4.0000 [drp] | OPHTHALMIC | Status: DC | PRN
  Filled 2020-03-24: qty 2

## 2020-03-24 MED ORDER — SODIUM CHLORIDE 0.9% FLUSH
10.0000 mL | INTRAVENOUS | Status: DC
  Administered 2020-03-24 (×3): 10 mL

## 2020-03-24 MED ORDER — MORPHINE 100MG IN NS 100ML (1MG/ML) PREMIX INFUSION
2.0000 mg/h | INTRAVENOUS | Status: DC
  Administered 2020-03-24: 3 mg/h via INTRAVENOUS
  Administered 2020-03-24: 4 mg/h via INTRAVENOUS
  Administered 2020-03-24: 5 mg/h via INTRAVENOUS
  Administered 2020-03-24: 7 mg/h via INTRAVENOUS
  Administered 2020-03-24: 2 mg/h via INTRAVENOUS
  Filled 2020-03-24: qty 100

## 2020-03-24 MED ORDER — ONDANSETRON HCL 4 MG/2ML IJ SOLN: 4.0000 mg | Freq: Four times a day (QID) | INTRAMUSCULAR | Status: DC | PRN

## 2020-03-24 MED ORDER — DIPHENHYDRAMINE HCL 50 MG/ML IJ SOLN: 12.5000 mg | INTRAMUSCULAR | Status: DC | PRN

## 2020-03-24 MED ORDER — MORPHINE SULFATE (PF) 4 MG/ML IV SOLN
3.0000 mg | INTRAVENOUS | Status: DC | PRN
  Administered 2020-03-24: 3 mg via INTRAVENOUS
  Filled 2020-03-24: qty 1

## 2020-03-24 MED ORDER — LORAZEPAM 2 MG/ML PO CONC: 1.0000 mg | ORAL | Status: DC | PRN

## 2020-03-24 MED ORDER — ACETAMINOPHEN 325 MG PO TABS: 650.0000 mg | ORAL_TABLET | Freq: Four times a day (QID) | ORAL | Status: DC | PRN

## 2020-03-24 MED ORDER — ONDANSETRON 4 MG PO TBDP: 4.0000 mg | ORAL_TABLET | Freq: Four times a day (QID) | ORAL | Status: DC | PRN

## 2020-03-24 MED ORDER — MORPHINE BOLUS VIA INFUSION
2.0000 mg | INTRAVENOUS | Status: DC | PRN
  Administered 2020-03-24 (×2): 2 mg via INTRAVENOUS
  Filled 2020-03-24: qty 2

## 2020-03-24 MED ORDER — ACETAMINOPHEN 650 MG RE SUPP: 650.0000 mg | Freq: Four times a day (QID) | RECTAL | Status: DC | PRN

## 2020-03-24 MED ORDER — LORAZEPAM 1 MG PO TABS: 1.0000 mg | ORAL_TABLET | ORAL | Status: DC | PRN

## 2020-03-24 MED ORDER — POLYVINYL ALCOHOL 1.4 % OP SOLN
1.0000 [drp] | Freq: Four times a day (QID) | OPHTHALMIC | Status: DC | PRN
  Filled 2020-03-24: qty 15

## 2020-03-24 MED ORDER — SODIUM CHLORIDE 0.9% FLUSH
50.0000 mL | INTRAVENOUS | Status: DC
  Administered 2020-03-24 (×3): 50 mL
  Filled 2020-03-24 (×5): qty 51

## 2020-03-24 MED ORDER — LORAZEPAM 2 MG/ML IJ SOLN: 1.0000 mg | INTRAMUSCULAR | Status: DC | PRN

## 2020-03-29 DIAGNOSIS — J939 Pneumothorax, unspecified: Secondary | ICD-10-CM

## 2020-04-14 NOTE — Progress Notes (Signed)
HOSPITAL MEDICINE OVERNIGHT EVENT NOTE    Notified by nursing earlier this evening that patient has unfortunately expired, time of death 10/21/2038 with pronouncement by nursing.    Family has been notified by nursing.  Cause of death will be Acute Hypoxic Respiratory failure secondary to COVID 19 and COVID pneumonia.  Michael Emerald  MD [facility]

## 2020-04-14 NOTE — Plan of Care (Signed)
  Problem: Health Behavior/Discharge Planning: Goal: Ability to manage health-related needs will improve Outcome: Progressing   Problem: Clinical Measurements: Goal: Diagnostic test results will improve Outcome: Progressing Goal: Respiratory complications will improve Outcome: Progressing   Problem: Activity: Goal: Risk for activity intolerance will decrease Outcome: Progressing   Problem: Nutrition: Goal: Adequate nutrition will be maintained Outcome: Progressing   Problem: Elimination: Goal: Will not experience complications related to bowel motility Outcome: Progressing Goal: Will not experience complications related to urinary retention Outcome: Progressing   Problem: Education: Goal: Knowledge of risk factors and measures for prevention of condition will improve Outcome: Progressing   Problem: Coping: Goal: Psychosocial and spiritual needs will be supported Outcome: Progressing   Problem: Respiratory: Goal: Will maintain a patent airway Outcome: Progressing Goal: Complications related to the disease process, condition or treatment will be avoided or minimized Outcome: Progressing   Patient AXOX4, placed on comfort care, oxygen weaned down to NRB and Heated High Flow at 15 liters while on morphine gtt, patient family at bedside. Will continue to monitor

## 2020-04-14 NOTE — Plan of Care (Signed)
  Problem: Health Behavior/Discharge Planning: Goal: Ability to manage health-related needs will improve Outcome: Progressing   Problem: Clinical Measurements: Goal: Diagnostic test results will improve Outcome: Progressing   Problem: Activity: Goal: Risk for activity intolerance will decrease Outcome: Progressing   Problem: Nutrition: Goal: Adequate nutrition will be maintained Outcome: Progressing   Problem: Elimination: Goal: Will not experience complications related to bowel motility Outcome: Progressing Goal: Will not experience complications related to urinary retention Outcome: Progressing   Problem: Education: Goal: Knowledge of risk factors and measures for prevention of condition will improve Outcome: Progressing   Problem: Respiratory: Goal: Will maintain a patent airway Outcome: Progressing   Problem: Clinical Measurements: Goal: Respiratory complications will improve Outcome: Not Progressing   Problem: Coping: Goal: Psychosocial and spiritual needs will be supported Outcome: Not Progressing   Problem: Respiratory: Goal: Complications related to the disease process, condition or treatment will be avoided or minimized Outcome: Not Progressing

## 2020-04-14 NOTE — Death Summary Note (Signed)
DEATH SUMMARY   Patient Details  Name: Michael Wolf MRN: [id number] DOB: April 25, 1956  Admission/Discharge Information   Admit Date:  2020/03/28  Date of Death: Date of Death: 04-11-2020  Time of Death: Time of Death: 2038/10/23  Length of Stay: 10-14-22  Referring Physician: [name], MD   Reason(s) for Hospitalization   Acute hypoxic respiratory failure secondary to COVID-19 pneumonia  Diagnoses  Preliminary cause of death:  Atrial fibrillation with RVR Right-sided pneumothorax Acute hypoxic respiratory failure secondary to COVID-19 pneumonia Non Hodgkin's lymphoma ([place]) Obesity, Class III, BMI 40-49.9 (morbid obesity) [facility])   Brief Hospital Course (including significant findings, care, treatment, and services provided and events leading to death)  Brief Narrative: Patient is a 64 y.o. male with PMHx of HTN, HLD, DM-2, non-Hodgkin's lymphoma in remission, OSA on CPAP, morbid obesity-recent breakthrough COVID-19 PNA with hypoxemia-discharged home on 8/26 with home O2-presented back to the ED on Mar 29, 2023 with significant worsening of hypoxemia.  Subsequently admitted to the hospitalist service-hospital course further complicated by right-sided pneumothorax requiring placement of chest tube, and A. fib with RVR.    COVID-19 vaccinated status: Vaccinated  Significant Events: 8/20-8/26>> hospitalized for hypoxia due to COVID-19-discharged on home O2-2 L 8/27>> readmitted for worsening hypoxemia-now requiring 15 L of HFNC. 9/3>> chest x-ray with large right pneumothorax-PCCM consulted for chest tube placement 9/3-9/4>> atrial fib with RVR-spontaneously converted to sinus rhythm on 9/4  Significant studies: 8/27>>Chest x-ray: COPD, progressive right lower lobe infiltrate. 8/28>> CT chest: Extensive bilateral pulmonary groundglass opacification 8/28>> Echo: EF 55-60% 9/3>> chest x-ray: Large right pneumothorax 9/5>> chest x-ray: Right apical pneumothorax has decreased in size, stable  right chest tube-no change in bilateral airspace disease  COVID-19 medications: Steroids: 8/20>> Remdesivir: 8/20>> 8/24 Baricitinib: 8/20>> 8/26, 8/28>>9/3  Antibiotics: None  Microbiology data: 03-29-2023 >>blood culture: Negative  Procedures: 9/3>> chest tube insertion (PCCM-Dr. Tamala Julian)  Consults: [facility] course by problem list Acute Hypoxic Resp Failure due to Covid 19 Viral pneumonia: Unfortunately in spite of maximal medical therapy-patient continued to deteriorate-he was on maximal O2 support with heated high flow and NRB.  He was treated with steroids/baricitinib.  Due to continued deterioration-extensive discussion was held with patient and family-patient did not want aggressive care including mechanical ventilation and intubation.  On 9/10-patient deteriorated further-he became very symptomatic and was clearly suffering-after discussion with patient and then with his spouse over the phone-he was transitioned to full comfort measures and subsequently started on a morphine infusion.  Patient expired in the few hours.  Large right pneumothorax: Secondary to COVID-19-PCCM consulted-chest tube placed on 9/3.  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   A. fib with RVR: Occurred on 9/3 evening--managed with beta-blocker and 1 dose of digoxin-spontaneously converted to sinus rhythm on 9/4-remained in sinus rhythm on 9/6.  Was maintained on therapeutic Lovenox and beta-blocker. Echo with preserved EF.  Leukocytosis: Likely secondary to steroids-repeat procalcitonin on 9/7 negative.  No indication to start antibiotics.  HTN: BP stable-was on antihypertensives-did have episodes of hypotension intermittently requiring IV fluid boluses.   HLD:  Was maintained on statin  DM-2 (A1c 6.9 on 03/08/2020): Was maintained on SSI  History of diffuse large B-cell lymphoma:  in remission-follows with oncology at [facility]  OSA: Unable to tolerate CPAP  due to severe hypoxemia  Morbid Obesity: Estimated body mass index is 37.73 kg/m as calculated from the following:   Height as of this encounter: 6' (1.829 m).   Weight as of this encounter: 126.2 kg.  Pertinent Labs and Studies  Significant Diagnostic Studies DG Chest 1 View  Result Date: 03/17/2020 CLINICAL DATA:  Follow-up pneumothorax. EXAM: CHEST  1 VIEW COMPARISON:  Chest radiograph earlier this day and prior studies FINDINGS: A RIGHT thoracostomy tube is now noted with decrease in RIGHT pneumothorax, now small, approximately 15%. Diffuse bilateral airspace opacities are again noted. Cardiomegaly is unchanged. No other significant change identified. IMPRESSION: RIGHT thoracostomy tube placement with decreased RIGHT pneumothorax, now approximately 15%. Electronically Signed   By: Margarette Canada M.D.   On: 03/17/2020 09:36   CT CHEST WO CONTRAST  Result Date: 03/11/2020 CLINICAL DATA:  Pneumonia, unresolved. History of non-Hodgkin's lymphoma. Positive for coronavirus. EXAM: CT CHEST WITHOUT CONTRAST TECHNIQUE: Multidetector CT imaging of the chest was performed following the standard protocol without IV contrast. COMPARISON:  Chest radiograph 02/21/2020 FINDINGS: Cardiovascular: Normal heart size. No pericardial effusion. Aortic atherosclerosis. Coronary artery calcifications. Mediastinum/Nodes: No enlarged mediastinal or axillary lymph nodes. Thyroid gland, trachea, and esophagus demonstrate no significant findings. Lungs/Pleura: There is no pleural effusion. Extensive, bilateral pulmonary ground-glass opacification with patchy areas of nodular consolidation compatible with atypical viral pneumonia. There is a subpleural bleb identified within the posterior left lung base measuring 5.6 cm, image 145/6. Upper Abdomen: Multiple left lobe of liver cyst noted measuring up to 2.8 cm. No pain acute finding noted within the imaged portions of the upper abdomen. Musculoskeletal: No chest wall mass or  suspicious bone lesions identified. IMPRESSION: 1. Extensive, bilateral pulmonary ground-glass opacification with patchy areas of nodular consolidation compatible with atypical viral pneumonia. 2. Coronary artery calcifications noted. 3. Aortic atherosclerosis. Aortic Atherosclerosis (ICD10-I70.0). Electronically Signed   By: Kerby Moors M.D.   On: 03/11/2020 11:37   DG CHEST PORT 1 VIEW  Result Date: 2020/03/25 CLINICAL DATA:  Shortness of breath EXAM: PORTABLE CHEST 1 VIEW COMPARISON:  03-25-20 study obtained earlier the day FINDINGS: Chest tube remains on the right. Trace pneumothorax right apex present without tension component. There is multifocal interstitial and airspace opacity bilaterally, stable from earlier in the day. Heart is upper normal in size with pulmonary vascularity normal. No adenopathy. No bone lesions. IMPRESSION: Stable chest tube position on the right. Trace right apical pneumothorax. Persistent patchy airspace interstitial opacity bilaterally, likely due to multifocal atypical organism pneumonia. No new opacity appreciable. Stable cardiac silhouette. Electronically Signed   By: Lowella Grip III M.D.   On: 2020-03-25 07:57   DG CHEST PORT 1 VIEW  Result Date: 25-Mar-2020 CLINICAL DATA:  Pneumothorax follow-up.  COVID. EXAM: PORTABLE CHEST 1 VIEW COMPARISON:  03-25-2020. FINDINGS: Right chest tube in stable position. Interim significant improvement of right-sided pneumothorax. Tiny right apical residual may be present. Cardiomegaly. No pulmonary venous congestion. Diffuse bilateral pulmonary infiltrates/edema again noted. No pleural effusion. Degenerative change thoracic spine. IMPRESSION: 1. Right chest tube in stable position. Interim significant improvement right-sided pneumothorax. Tiny right apical residual may be present. 2. Diffuse bilateral pulmonary infiltrates/edema again noted without interim change. 3.  Cardiomegaly.  No pulmonary venous congestion.  Electronically Signed   By: Marcello Moores  Register   On: 03-25-2020 06:38   DG CHEST PORT 1 VIEW  Result Date: 03-25-2020 CLINICAL DATA:  Acute respiratory failure with hypoxia. COVID positive. Pneumothorax EXAM: PORTABLE CHEST 1 VIEW COMPARISON:  03/23/2020 FINDINGS: Cardiac enlargement. Increasing right pneumothorax with right chest tube in place. Atelectasis in the right lung. Bilateral pulmonary infiltrates compatible with COVID pneumonia. No pleural effusions. IMPRESSION: Increasing right pneumothorax with right chest tube in place. Bilateral pulmonary  infiltrates compatible with COVID pneumonia. Electronically Signed   By: Lucienne Capers M.D.   On: Apr 15, 2020 03:33   DG Chest Port 1 View  Result Date: 03/22/2020 CLINICAL DATA:  64 year old male positive COVID-19. Shortness of breath. EXAM: PORTABLE CHEST 1 VIEW COMPARISON:  Portable chest 03/21/2020 and earlier. FINDINGS: Portable AP upright view at 0646 hours. Pigtail right chest tube remains in place. No pneumothorax. Continued low lung volumes with diffuse, coarse bilateral pulmonary interstitial opacity. Mediastinal contours remain within normal limits. Visualized tracheal air column is within normal limits. No definite pleural effusion. Ventilation is stable since yesterday. The degree of interstitial opacity has mildly progressed compared to 03/20/2020. Negative visible bowel gas pattern. IMPRESSION: 1. Stable right chest tube. No pneumothorax visible today. 2. Bilateral COVID-19 pneumonia with stable ventilation from yesterday, mildly progressed diffuse interstitial opacity since 03/20/2020. Electronically Signed   By: Genevie Ann M.D.   On: 03/22/2020 07:55   DG Chest Port 1 View  Result Date: 03/21/2020 CLINICAL DATA:  Shortness of breath and cough EXAM: PORTABLE CHEST 1 VIEW COMPARISON:  March 20, 2020 FINDINGS: Chest tube remains on the right, unchanged in position. Small right apical pneumothorax is stable. Ill-defined airspace opacity in the  right mid lung laterally is stable. Somewhat more subtle ill-defined airspace opacity in the left upper lobe is likewise stable. There is also rather subtle ill-defined opacity in the right base region. No new opacity evident. Heart is upper normal in size with pulmonary vascularity normal. No adenopathy. No bone lesion. IMPRESSION: Small right apical pneumothorax with chest tube in place on the right, stable. No tension component. Multifocal airspace opacity again noted, similar to 1 day prior. No new opacity. Stable cardiac silhouette. Electronically Signed   By: Lowella Grip III M.D.   On: 03/21/2020 08:10   DG Chest Port 1 View  Result Date: 03/18/2020 CLINICAL DATA:  COVID positive. Follow-up exam for respiratory opacities and right chest tube/pneumothorax. EXAM: PORTABLE CHEST 1 VIEW COMPARISON:  03/18/2020 at 12:05 a.m., and earlier studies. FINDINGS: Minimal right apical pneumothorax is stable from the earlier exam. Right chest tube is unchanged in position. Bilateral hazy airspace lung opacities are unchanged. No new lung abnormalities. IMPRESSION: 1. Minimal right apical pneumothorax is unchanged from the most recent prior exam. 2. Stable bilateral airspace lung opacities. Stable right-sided chest tube. Electronically Signed   By: Lajean Manes M.D.   On: 03/18/2020 10:34   DG CHEST PORT 1 VIEW  Result Date: 03/18/2020 CLINICAL DATA:  Chest tube EXAM: PORTABLE CHEST 1 VIEW COMPARISON:  03/17/2020 FINDINGS: Right chest tube in place. Small residual right apical pneumothorax, decreased since prior study. Cardiomegaly. Diffuse airspace disease throughout the right lung. Patchy airspace disease on the left. IMPRESSION: Decreasing size of the right pneumothorax. Now small residual right apical pneumothorax remains. Bilateral airspace disease, right greater than left. Electronically Signed   By: Rolm Baptise M.D.   On: 03/18/2020 02:08   DG Chest Port 1 View  Result Date: 02/14/2020 CLINICAL DATA:   Short of breath EXAM: PORTABLE CHEST 1 VIEW COMPARISON:  03/05/2020 FINDINGS: Progression of right lower lobe airspace disease. Cardiac silhouette upper normal. Negative for heart failure. Left lung clear. Probable underlying COPD IMPRESSION: COPD.  Progressive right lower lobe infiltrate. Electronically Signed   By: Franchot Gallo M.D.   On: 02/29/2020 16:17   DG Chest Port 1 View  Result Date: 03/05/2020 CLINICAL DATA:  COVID-19. EXAM: PORTABLE CHEST 1 VIEW COMPARISON:  March 03, 2020  FINDINGS: New hazy infiltrates are seen in the lungs, particularly the upper lobes. The cardiomediastinal silhouette is stable. No pneumothorax. No other acute abnormalities. IMPRESSION: New hazy infiltrates, particularly in the upper lobes, worrisome for developing COVID-19 pneumonia given history. Electronically Signed   By: Dorise Bullion III M.D   On: 03/05/2020 08:38   DG Chest Port 1 View  Result Date: 03/03/2020 CLINICAL DATA:  COVID-19 diagnosed 2 weeks ago, increasing shortness of breath, hypoxemia EXAM: PORTABLE CHEST 1 VIEW COMPARISON:  None. FINDINGS: Single frontal view of the chest demonstrates an unremarkable cardiac silhouette. Diffuse interstitial prominence is seen, with faint ground-glass opacities within the mid and lower lung zones. No effusion or pneumothorax. No acute bony abnormality. IMPRESSION: 1. Interstitial prominence with bilateral ground-glass airspace disease. Differential includes multifocal atypical pneumonia versus edema. Electronically Signed   By: Randa Ngo M.D.   On: 03/03/2020 15:43   DG Chest Port 1V same Day  Result Date: 03/23/2020 CLINICAL DATA:  Shortness of breath, COVID. EXAM: PORTABLE CHEST 1 VIEW COMPARISON:  Multiple priors, most recent 03/22/2020 FINDINGS: Patient rotation. Similar appearance of low lung volumes with diffuse coarse bilateral interstitial thickening. More confluent airspace opacities in lung bases, similar to prior. Similar positioning of a right  sided pigtail chest tube, which projects over the lateral right mid lung. Likely small right apical pneumothorax. No visible pleural effusions. Similar w mild enlargement the cardiac silhouette. IMPRESSION: 1. Similar position of a right chest tube. Likely small right apical pneumothorax. 2. Similar multifocal interstitial and airspace opacities, compatible with multifocal pneumonia. Electronically Signed   By: Margaretha Sheffield MD   On: 03/23/2020 08:09   DG Chest Port 1V same Day  Result Date: 03/20/2020 CLINICAL DATA:  COVID-19 positive, shortness of breath. EXAM: PORTABLE CHEST 1 VIEW COMPARISON:  March 19, 2020. FINDINGS: Stable cardiomediastinal silhouette. Stable position of right-sided chest tube with stable minimal right apical pneumothorax. Stable bilateral lung opacities are noted concerning for multifocal pneumonia. No pleural effusion is noted. Bony thorax is unremarkable. IMPRESSION: Stable position of right-sided chest tube with stable minimal right apical pneumothorax. Stable bilateral lung opacities are noted concerning for multifocal pneumonia. Electronically Signed   By: Marijo Conception M.D.   On: 03/20/2020 12:24   DG Chest Port 1V same Day  Result Date: 03/19/2020 CLINICAL DATA:  COVID-19 infection.  Follow-up exam. EXAM: PORTABLE CHEST 1 VIEW COMPARISON:  03/18/2020 and earlier studies. FINDINGS: Tiny right apical pneumothorax has decreased in size from the previous exam. Right chest tube is stable, lying along right lateral mid hemithorax. Interstitial and hazy airspace lung opacities are unchanged from the previous day's exam. No new lung abnormalities. IMPRESSION: 1. Right apical pneumothorax has decreased in size, only a tiny pneumothorax persisting at the apex. Stable right chest tube. 2. No change in the bilateral airspace lung opacities consistent with multifocal pneumonia. Electronically Signed   By: Lajean Manes M.D.   On: 03/19/2020 08:46   DG Chest Port 1V same  Day  Result Date: 03/17/2020 CLINICAL DATA:  Shortness of breath. EXAM: PORTABLE CHEST 1 VIEW COMPARISON:  Chest CT 03/12/2019.  Chest x-ray 02/19/2020. FINDINGS: Large right pneumothorax is new in the interval. Diffuse interstitial and ground-glass airspace disease in the left lung is progressive since chest x-ray of 03/09/2020. The cardio pericardial silhouette is enlarged. The visualized bony structures of the thorax show no acute abnormality. Telemetry leads overlie the chest. IMPRESSION: New large right pneumothorax. Interval progression of diffuse interstitial and alveolar opacity in  the left lung. I personally called the results of this study at 0829 hours to the patient's nurse Greta, who took the report for Dr. Sloan Leiter, who was currently rounding. Electronically Signed   By: Misty Stanley M.D.   On: 03/17/2020 09:07   ECHOCARDIOGRAM LIMITED  Result Date: 03/11/2020    ECHOCARDIOGRAM LIMITED REPORT   Patient Name:   JMARI PELC Date of Exam: 03/11/2020 Medical Rec #:  [id number]       Height:       72.0 in Accession #:    [id number]      Weight:       305.7 lb Date of Birth:  06/28/1956       BSA:          2.552 m Patient Age:    53 years        BP:           122/68 mmHg Patient Gender: M               HR:           85 bpm. Exam Location:  Inpatient Procedure: Limited Echo, Color Doppler and Cardiac Doppler Indications:   pulmonary hypertension 416.8  History:       Patient has no prior history of Echocardiogram examinations.                Covid.  Sonographer:   Johny Chess Referring      [id number] Lequita Halt Phys: IMPRESSIONS  1. Left ventricular ejection fraction, by estimation, is 55 to 60%. The left ventricle has normal function. The left ventricle has no regional wall motion abnormalities. Left ventricular diastolic parameters were normal.  2. Right ventricular systolic function is normal. The right ventricular size is mildly enlarged. Tricuspid regurgitation signal is inadequate for  assessing PA pressure.  3. The aortic valve is tricuspid. Aortic valve regurgitation is not visualized. Mild aortic valve sclerosis is present, with no evidence of aortic valve stenosis.  4. The inferior vena cava is normal in size with greater than 50% respiratory variability, suggesting right atrial pressure of 3 mmHg. FINDINGS  Left Ventricle: Left ventricular ejection fraction, by estimation, is 55 to 60%. The left ventricle has normal function. The left ventricle has no regional wall motion abnormalities. The left ventricular internal cavity size was normal in size. There is  borderline left ventricular hypertrophy. Right Ventricle: The right ventricular size is mildly enlarged. No increase in right ventricular wall thickness. Right ventricular systolic function is normal. Tricuspid regurgitation signal is inadequate for assessing PA pressure. Pericardium: There is no evidence of pericardial effusion. Presence of pericardial fat pad. Tricuspid Valve: The tricuspid valve is grossly normal. Tricuspid valve regurgitation is trivial. Aortic Valve: The aortic valve is tricuspid. Aortic valve regurgitation is not visualized. Mild aortic valve sclerosis is present, with no evidence of aortic valve stenosis. Mild aortic valve annular calcification. Pulmonic Valve: The pulmonic valve was grossly normal. Pulmonic valve regurgitation is trivial. Venous: The inferior vena cava is normal in size with greater than 50% respiratory variability, suggesting right atrial pressure of 3 mmHg. IAS/Shunts: No atrial level shunt detected by color flow Doppler. LEFT VENTRICLE PLAX 2D LVIDd:         5.00 cm  Diastology LVIDs:         3.50 cm  LV e' lateral:   13.90 cm/s LV PW:         1.10 cm  LV E/e' lateral: 5.1 LV IVS:  1.00 cm  LV e' medial:    12.60 cm/s LVOT diam:     2.30 cm  LV E/e' medial:  5.6 LV SV:         85 LV SV Index:   33 LVOT Area:     4.15 cm  IVC IVC diam: 1.90 cm LEFT ATRIUM         Index LA diam:    4.00 cm  1.57 cm/m  AORTIC VALVE LVOT Vmax:   106.00 cm/s LVOT Vmean:  68.700 cm/s LVOT VTI:    0.205 m MITRAL VALVE MV Area (PHT): 4.60 cm    SHUNTS MV Decel Time: 165 msec    Systemic VTI:  0.20 m MV E velocity: 70.30 cm/s  Systemic Diam: 2.30 cm MV A velocity: 90.80 cm/s MV E/A ratio:  0.77 Rozann Lesches MD Electronically signed by Rozann Lesches MD Signature Date/Time: 03/11/2020/4:40:56 PM    Final     Microbiology No results found for this or any previous visit (from the past 240 hour(s)).  Lab Basic Metabolic Panel: Recent Labs  Lab 03/20/20 0252 03/21/20 0352 03/22/20 0415 03/23/20 0332 04-15-20 1111  NA 136 140 142 146* 147*  K 4.1 4.2 4.3 4.3 4.3  CL 97* 101 102 107 107  CO2 28 24 25 24 24   GLUCOSE 133* 122* 132* 175* 177*  BUN 23 24* 25* 37* 48*  CREATININE 0.88 0.94 0.95 1.03 1.53*  CALCIUM 8.4* 8.5* 8.5* 8.7* 8.5*   Liver Function Tests: Recent Labs  Lab 03/20/20 0252 03/21/20 0352 03/22/20 0415 03/23/20 0332 04-15-20 1111  AST 32 30 26 24 27   ALT 53* 48* 43 38 36  ALKPHOS 74 79 86 89 96  BILITOT 1.2 1.3* 1.6* 1.4* 1.8*  PROT 5.6* 5.7* 5.8* 5.9* 5.6*  ALBUMIN 2.6* 2.5* 2.6* 2.4* 2.4*   No results for input(s): LIPASE, AMYLASE in the last 168 hours. No results for input(s): AMMONIA in the last 168 hours. CBC: Recent Labs  Lab 03/19/20 0254 03/20/20 0252 03/22/20 0415 03/23/20 0332 Apr 15, 2020 1111  WBC 14.4* 18.6* 20.1* 16.5* 14.3*  HGB 13.8 14.7 15.6 15.3 15.2  HCT 42.8 46.0 49.5 48.7 47.8  MCV 90.9 89.5 91.5 90.5 91.4  PLT 217 210 230 202 168   Cardiac Enzymes: No results for input(s): CKTOTAL, CKMB, CKMBINDEX, TROPONINI in the last 168 hours. Sepsis Labs: Recent Labs  Lab 03/20/20 0252 03/21/20 0352 03/22/20 0415 03/23/20 0332 04/15/20 1111  PROCALCITON  --  <0.10  --   --   --   WBC 18.6*  --  20.1* 16.5* 14.3*    Procedures/Operations   Ryoma Nofziger 03/25/2020, 3:52 PM

## 2020-04-14 NOTE — Progress Notes (Addendum)
PCCM Interval Note  Chest x-ray from 8 AM reviewed.  No reaccumulation of his right pneumothorax since his chest tube was flushed, cleared.  Compared with chest x-ray from 6:38.  Continuous chest tube to suction, followed for persistence of air leak.  Continue to flush chest tube and tubing every 2 hours.  PCCM will continue to follow  Baltazar Apo, MD, PhD 04-05-20, 10:19 AM [facility] [phone] or if no answer [phone]

## 2020-04-14 NOTE — Progress Notes (Addendum)
PCCM INTERVAL PROGRESS NOTE  Dr Orpah Melter and I were called to bedside to evaluate patient with complaints of worsening dyspnea and hypoxia.   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 Pigtail chest tube placed 9/3.  From 40L HFNC + NRB up to 60 L + NRB over the course of the shift. His HR and RR have climbed as well. Repeat CXR was done and shows progression of right sided PTX.   Initially there was tidaling in the pleuravac, but no clear, consistent air leak. I flushed with 10cc saline. Tube flushed easily and I was able to aspirate air easily when drawing back 10 cc. I aspirated 550cc of air to buy some time while the assessment was ongoing. Ran the tubing. There were no kinks or obvious leaks. Around the area where the two clear tubings met (the tube connected to the chest tube and the one connected to the pleuravac) there was a white milky substance, which appeared to be blocking the airflow. 50 cc of saline were flushed from the cook catheter stopcock to the pleuravac. Frothy substance cleared and now a 2/7 air leak is visible with each breath.   Will repeat a chest xray and continue to monitor.  RN to repeat flushes of chest tube (40m), and tubing itself (526m q 2 hours.    Patient is a DNR, no benefit to ICU transfer at this time as there are no additional therapies we could offer him there within his set limitations.    Update 0645 - Chest Xray looks much better. Only a small apical pneumothorax remains. Will ask that pulmonary day team round on Mr MaCleckleroday and I have ordered a repeat CXR for 0930 just to ensure this improvement is the result of a functioning chest tube and not the air I aspirated with syringe.    PaGeorgann HousekeeperAGACNP-BC [place] for personal pager PCCM on call pager ([phone]/2020-04-01:00 AM

## 2020-04-14 NOTE — Progress Notes (Signed)
PROGRESS NOTE                                                                                                                                                                                                             Patient Demographics:    Michael Wolf, is a 64 y.o. male, DOB - 04/03/1956, URK:[id number]  Outpatient Primary MD for the patient is [name], MD   Admit date - 03/11/2020   LOS - 60  Chief Complaint  Patient presents with  . Shortness of Breath       Brief Narrative: Patient is a 64 y.o. male with PMHx of HTN, HLD, DM-2, non-Hodgkin's lymphoma in remission, OSA on CPAP, morbid obesity-recent breakthrough COVID-19 PNA with hypoxemia-discharged home on 8/26 with home O2-presented back to the ED on 8/27 with significant worsening of hypoxemia.  Subsequently admitted to the hospitalist service-hospital course further complicated by right-sided pneumothorax requiring placement of chest tube, and A. fib with RVR.    COVID-19 vaccinated status: Vaccinated  Significant Events: 8/20-8/26>> hospitalized for hypoxia due to COVID-19-discharged on home O2-2 L 8/27>> readmitted for worsening hypoxemia-now requiring 15 L of HFNC. 9/3>> chest x-ray with large right pneumothorax-PCCM consulted for chest tube placement 9/3-9/4>> atrial fib with RVR-spontaneously converted to sinus rhythm on 9/4  Significant studies: 8/27>>Chest x-ray: COPD, progressive right lower lobe infiltrate. 8/28>> CT chest: Extensive bilateral pulmonary groundglass opacification 8/28>> Echo: EF 55-60% 9/3>> chest x-ray: Large right pneumothorax 9/5>> chest x-ray: Right apical pneumothorax has decreased in size, stable right chest tube-no change in bilateral airspace disease  COVID-19 medications: Steroids: 8/20>> Remdesivir: 8/20>> 8/24 Baricitinib: 8/20>> 8/26, 8/28>>9/3  Antibiotics: None  Microbiology data: 8/27 >>blood  culture: Negative  Procedures: 9/3>> chest tube insertion (PCCM-Dr. Tamala Julian)  Consults: None  DVT prophylaxis: Therapeutic Lovenox    Subjective:   Had a rough night-he feels short of breath this morning-acknowledges that he is almost giving out-remains on heated high flow and NRB.  Looks incredibly weak-frail.  After a brief conversation at bedside with me-he was agreeable to let nature take its course-I subsequently called his wife to inform her that we would now transition to full comfort measures.   Assessment  & Plan :   Acute Hypoxic Resp Failure due to Covid 19 Viral pneumonia: Continues to have severe hypoxemia-he is now getting more symptomatic.he remains  on heated high flow and NRB.  I have had a discussion with the patient this morning-he acknowledged that he is now "suffering"-and that he had a very rough night.  I explained that at this point apart from transitioning him to comfort measures-I really did not have any other options-and that it was probably as good as any time to initiate such measures (today is his birthday!!).  He was agreeable-I subsequently called his wife [name] over the phone-we had a long conversation-she understands the continued deterioration-and the fact that he is more symptomatic with his pneumonia and that he is clearly suffering.  His wife subsequently came to see him-I spent a significant amount of time talking to her on the hallway.  Starting full comfort measures-morphine infusion-suspect he will expire in the next few days.    Fever: afebrile O2 requirements:  SpO2: 92 % O2 Flow Rate (L/min): 50 L/min FiO2 (%): 80 %   COVID-19 Labs: Recent Labs    03/22/20 0415 03/22/20 0548 03/23/20 0332 2020/04/01 1111  DDIMER  --  1.86* 1.27* 1.75*  CRP 11.0*  --  12.4* 8.8*       Component Value Date/Time   BNP 39.4 03/14/2020 0319    Recent Labs  Lab 03/21/20 0352  PROCALCITON <0.10    Lab Results  Component Value Date   SARSCOV2NAA  POSITIVE (A) 03/03/2020     Prone/Incentive Spirometry: encouraged  incentive spirometry use 3-4/hour.  Large right pneumothorax: Secondary to COVID-19-PCCM consulted-chest tube placed on 9/3.  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  Chest tube remains in place   A. fib with RVR: Occurred on 9/3 evening--managed with beta-blocker and 1 dose of digoxin-spontaneously converted to sinus rhythm on 9/4-remains in sinus rhythm on 9/6.  Was maintained on therapeutic Lovenox and beta-blocker-but since transitioning to comfort care-both these agents have been discontinued.  Echo with preserved EF.  Leukocytosis: Likely secondary to steroids-repeat procalcitonin on 9/7 negative.  No indication to start antibiotics.  HTN: BP stable-no longer on antihypertensives as on full comfort measures.     HLD: Stopping statin/Zetia  DM-2 (A1c 6.9 on 03/08/2020): No longer on SSI-comfort measures in effect  Recent Labs    03/23/20 2149 04-01-20 0746 04/01/20 1201  GLUCAP 145* 134* 179*   History of diffuse large B-cell lymphoma:  in remission-follows with oncology at [facility]  OSA: Unable to tolerate CPAP due to severe hypoxemia-  Palliative care discussion: Have had multiple conversations with the patient-and his wife [name] almost on a daily basis for the past 1 week-DNR remains in place.  Unfortunately he continues to slowly deteriorate-in spite of maximal medical care.  This morning-he is significantly more symptomatic-and is clearly suffering.  After multiple discussions with patient-and is spouse over the phone-and then in person (she came to visit him)-we all have agreed to [place] transition him to comfort measures.  Starting morphine infusion-expect inpatient death in a matter of hours to days.    Morbid Obesity: Estimated body mass index is 37.73 kg/m as calculated from the following:   Height as of this encounter: 6' (1.829 m).   Weight as of this encounter:  126.2 kg.    ABG:    Component Value Date/Time   PHART 7.517 (H) 04/01/20 0345   PCO2ART 35.0 Apr 01, 2020 0345   PO2ART 43.8 (L) 04/01/2020 0345   HCO3 28.1 (H) 04-01-2020 0345   O2SAT 78.3 04/01/2020 0345    Vent Settings: N/A  Condition -  Guarded  Family Communication  :  Spouse Joaquim Lai [phone]) updated over the phone 9/10-this morning-and then later in person in the hallway.  Code Status :  DNR  Diet :  Diet Order            Diet Heart Room service appropriate? Yes; Fluid consistency: Thin  Diet effective now                  Disposition Plan  :   Status is: Inpatient  Remains inpatient appropriate because:Inpatient level of care appropriate due to severity of illness   Dispo: The patient is from: Home              Anticipated d/c is to: Home              Anticipated d/c date is: > 3 days              Patient currently is not medically stable to d/c.   Barriers to discharge: Hypoxia requiring O2 supplementation  Antimicorbials  :    Anti-infectives (From admission, onward)   None      Inpatient Medications  Scheduled Meds:  Continuous Infusions: . morphine 2 mg/hr (04-18-2020 1357)   PRN Meds:.acetaminophen **OR** acetaminophen, atropine, diphenhydrAMINE, LORazepam **OR** LORazepam **OR** LORazepam, morphine, ondansetron **OR** ondansetron (ZOFRAN) IV, polyvinyl alcohol   Time Spent in minutes  35  See all Orders from today for further details   Oren Binet M.D on 2020-04-18 at 2:27 PM  To page go to [email or web address] - use universal password  [facility] -  Office  [phone]    Objective:   Vitals:   Apr 18, 2020 0800 18-Apr-2020 0900 18-Apr-2020 1000 Apr 18, 2020 1154  BP: 117/77  (!) 132/92 118/81  Pulse: (!) 126 (!) 114 (!) 122 (!) 111  Resp: (!) 34 (!) 28 (!) 24 (!) 26  Temp: (!) 100.4 F (38 C)   97.9 F (36.6 C)  TempSrc: Axillary   Axillary  SpO2: 96% 97% 95% 92%  Weight:      Height:        Wt Readings from Last 3  Encounters:  Apr 18, 2020 126.2 kg  03/03/20 136.1 kg  12/01/19 (!) 149.5 kg     Intake/Output Summary (Last 24 hours) at 04/18/2020 1427 Last data filed at 04/18/20 1000 Gross per 24 hour  Intake 750 ml  Output 1540 ml  Net -790 ml   Gen Exam: Tachypneic-in obvious discomfort-very frail appearing. HEENT:atraumatic, normocephalic Chest: B/L clear to auscultation anteriorly CVS:S1S2 regular Abdomen:soft non tender, non distended Extremities:no edema Neurology: Generalized weakness but moving all 4 extremities. Skin: no rash   Data Review:    CBC Recent Labs  Lab 03/19/20 0254 03/20/20 0252 03/22/20 0415 03/23/20 0332 04-18-20 1111  WBC 14.4* 18.6* 20.1* 16.5* 14.3*  HGB 13.8 14.7 15.6 15.3 15.2  HCT 42.8 46.0 49.5 48.7 47.8  PLT 217 210 230 202 168  MCV 90.9 89.5 91.5 90.5 91.4  MCH 29.3 28.6 28.8 28.4 29.1  MCHC 32.2 32.0 31.5 31.4 31.8  RDW 14.4 14.3 14.7 15.0 15.2    Chemistries  Recent Labs  Lab 03/20/20 0252 03/21/20 0352 03/22/20 0415 03/23/20 0332 Apr 18, 2020 1111  NA 136 140 142 146* 147*  K 4.1 4.2 4.3 4.3 4.3  CL 97* 101 102 107 107  CO2 28 24 25 24 24   GLUCOSE 133* 122* 132* 175* 177*  BUN 23 24* 25* 37* 48*  CREATININE 0.88 0.94 0.95 1.03 1.53*  CALCIUM 8.4* 8.5* 8.5*  8.7* 8.5*  AST 32 30 26 24 27   ALT 53* 48* 43 38 36  ALKPHOS 74 79 86 89 96  BILITOT 1.2 1.3* 1.6* 1.4* 1.8*   ------------------------------------------------------------------------------------------------------------------ No results for input(s): CHOL, HDL, LDLCALC, TRIG, CHOLHDL, LDLDIRECT in the last 72 hours.  Lab Results  Component Value Date   HGBA1C 6.9 (H) 03/08/2020   ------------------------------------------------------------------------------------------------------------------ No results for input(s): TSH, T4TOTAL, T3FREE, THYROIDAB in the last 72 hours.  Invalid input(s):  FREET3 ------------------------------------------------------------------------------------------------------------------ No results for input(s): VITAMINB12, FOLATE, FERRITIN, TIBC, IRON, RETICCTPCT in the last 72 hours.  Coagulation profile No results for input(s): INR, PROTIME in the last 168 hours.  Recent Labs    03/23/20 0332 Apr 08, 2020 1111  DDIMER 1.27* 1.75*    Cardiac Enzymes No results for input(s): CKMB, TROPONINI, MYOGLOBIN in the last 168 hours.  Invalid input(s): CK ------------------------------------------------------------------------------------------------------------------    Component Value Date/Time   BNP 39.4 03/14/2020 0319    Micro Results No results found for this or any previous visit (from the past 240 hour(s)).  Radiology Reports DG Chest 1 View  Result Date: 03/17/2020 CLINICAL DATA:  Follow-up pneumothorax. EXAM: CHEST  1 VIEW COMPARISON:  Chest radiograph earlier this day and prior studies FINDINGS: A RIGHT thoracostomy tube is now noted with decrease in RIGHT pneumothorax, now small, approximately 15%. Diffuse bilateral airspace opacities are again noted. Cardiomegaly is unchanged. No other significant change identified. IMPRESSION: RIGHT thoracostomy tube placement with decreased RIGHT pneumothorax, now approximately 15%. Electronically Signed   By: Margarette Canada M.D.   On: 03/17/2020 09:36   CT CHEST WO CONTRAST  Result Date: 03/11/2020 CLINICAL DATA:  Pneumonia, unresolved. History of non-Hodgkin's lymphoma. Positive for coronavirus. EXAM: CT CHEST WITHOUT CONTRAST TECHNIQUE: Multidetector CT imaging of the chest was performed following the standard protocol without IV contrast. COMPARISON:  Chest radiograph 03/09/2020 FINDINGS: Cardiovascular: Normal heart size. No pericardial effusion. Aortic atherosclerosis. Coronary artery calcifications. Mediastinum/Nodes: No enlarged mediastinal or axillary lymph nodes. Thyroid gland, trachea, and esophagus  demonstrate no significant findings. Lungs/Pleura: There is no pleural effusion. Extensive, bilateral pulmonary ground-glass opacification with patchy areas of nodular consolidation compatible with atypical viral pneumonia. There is a subpleural bleb identified within the posterior left lung base measuring 5.6 cm, image 145/6. Upper Abdomen: Multiple left lobe of liver cyst noted measuring up to 2.8 cm. No pain acute finding noted within the imaged portions of the upper abdomen. Musculoskeletal: No chest wall mass or suspicious bone lesions identified. IMPRESSION: 1. Extensive, bilateral pulmonary ground-glass opacification with patchy areas of nodular consolidation compatible with atypical viral pneumonia. 2. Coronary artery calcifications noted. 3. Aortic atherosclerosis. Aortic Atherosclerosis (ICD10-I70.0). Electronically Signed   By: Kerby Moors M.D.   On: 03/11/2020 11:37   DG CHEST PORT 1 VIEW  Result Date: 04/08/2020 CLINICAL DATA:  Shortness of breath EXAM: PORTABLE CHEST 1 VIEW COMPARISON:  08-Apr-2020 study obtained earlier the day FINDINGS: Chest tube remains on the right. Trace pneumothorax right apex present without tension component. There is multifocal interstitial and airspace opacity bilaterally, stable from earlier in the day. Heart is upper normal in size with pulmonary vascularity normal. No adenopathy. No bone lesions. IMPRESSION: Stable chest tube position on the right. Trace right apical pneumothorax. Persistent patchy airspace interstitial opacity bilaterally, likely due to multifocal atypical organism pneumonia. No new opacity appreciable. Stable cardiac silhouette. Electronically Signed   By: Lowella Grip III M.D.   On: 2020/04/08 07:57   DG CHEST PORT 1 VIEW  Result Date: 04/08/20  CLINICAL DATA:  Pneumothorax follow-up.  COVID. EXAM: PORTABLE CHEST 1 VIEW COMPARISON:  04-17-2020. FINDINGS: Right chest tube in stable position. Interim significant improvement of  right-sided pneumothorax. Tiny right apical residual may be present. Cardiomegaly. No pulmonary venous congestion. Diffuse bilateral pulmonary infiltrates/edema again noted. No pleural effusion. Degenerative change thoracic spine. IMPRESSION: 1. Right chest tube in stable position. Interim significant improvement right-sided pneumothorax. Tiny right apical residual may be present. 2. Diffuse bilateral pulmonary infiltrates/edema again noted without interim change. 3.  Cardiomegaly.  No pulmonary venous congestion. Electronically Signed   By: Marcello Moores  Register   On: 2020/04/17 06:38   DG CHEST PORT 1 VIEW  Result Date: 17-Apr-2020 CLINICAL DATA:  Acute respiratory failure with hypoxia. COVID positive. Pneumothorax EXAM: PORTABLE CHEST 1 VIEW COMPARISON:  03/23/2020 FINDINGS: Cardiac enlargement. Increasing right pneumothorax with right chest tube in place. Atelectasis in the right lung. Bilateral pulmonary infiltrates compatible with COVID pneumonia. No pleural effusions. IMPRESSION: Increasing right pneumothorax with right chest tube in place. Bilateral pulmonary infiltrates compatible with COVID pneumonia. Electronically Signed   By: Lucienne Capers M.D.   On: 04/17/2020 03:33   DG Chest Port 1 View  Result Date: 03/22/2020 CLINICAL DATA:  64 year old male positive COVID-19. Shortness of breath. EXAM: PORTABLE CHEST 1 VIEW COMPARISON:  Portable chest 03/21/2020 and earlier. FINDINGS: Portable AP upright view at 0646 hours. Pigtail right chest tube remains in place. No pneumothorax. Continued low lung volumes with diffuse, coarse bilateral pulmonary interstitial opacity. Mediastinal contours remain within normal limits. Visualized tracheal air column is within normal limits. No definite pleural effusion. Ventilation is stable since yesterday. The degree of interstitial opacity has mildly progressed compared to 03/20/2020. Negative visible bowel gas pattern. IMPRESSION: 1. Stable right chest tube. No  pneumothorax visible today. 2. Bilateral COVID-19 pneumonia with stable ventilation from yesterday, mildly progressed diffuse interstitial opacity since 03/20/2020. Electronically Signed   By: Genevie Ann M.D.   On: 03/22/2020 07:55   DG Chest Port 1 View  Result Date: 03/21/2020 CLINICAL DATA:  Shortness of breath and cough EXAM: PORTABLE CHEST 1 VIEW COMPARISON:  March 20, 2020 FINDINGS: Chest tube remains on the right, unchanged in position. Small right apical pneumothorax is stable. Ill-defined airspace opacity in the right mid lung laterally is stable. Somewhat more subtle ill-defined airspace opacity in the left upper lobe is likewise stable. There is also rather subtle ill-defined opacity in the right base region. No new opacity evident. Heart is upper normal in size with pulmonary vascularity normal. No adenopathy. No bone lesion. IMPRESSION: Small right apical pneumothorax with chest tube in place on the right, stable. No tension component. Multifocal airspace opacity again noted, similar to 1 day prior. No new opacity. Stable cardiac silhouette. Electronically Signed   By: Lowella Grip III M.D.   On: 03/21/2020 08:10   DG Chest Port 1 View  Result Date: 03/18/2020 CLINICAL DATA:  COVID positive. Follow-up exam for respiratory opacities and right chest tube/pneumothorax. EXAM: PORTABLE CHEST 1 VIEW COMPARISON:  03/18/2020 at 12:05 a.m., and earlier studies. FINDINGS: Minimal right apical pneumothorax is stable from the earlier exam. Right chest tube is unchanged in position. Bilateral hazy airspace lung opacities are unchanged. No new lung abnormalities. IMPRESSION: 1. Minimal right apical pneumothorax is unchanged from the most recent prior exam. 2. Stable bilateral airspace lung opacities. Stable right-sided chest tube. Electronically Signed   By: Lajean Manes M.D.   On: 03/18/2020 10:34   DG CHEST PORT 1 VIEW  Result  Date: 03/18/2020 CLINICAL DATA:  Chest tube EXAM: PORTABLE CHEST 1 VIEW  COMPARISON:  03/17/2020 FINDINGS: Right chest tube in place. Small residual right apical pneumothorax, decreased since prior study. Cardiomegaly. Diffuse airspace disease throughout the right lung. Patchy airspace disease on the left. IMPRESSION: Decreasing size of the right pneumothorax. Now small residual right apical pneumothorax remains. Bilateral airspace disease, right greater than left. Electronically Signed   By: Rolm Baptise M.D.   On: 03/18/2020 02:08   DG Chest Port 1 View  Result Date: 02/24/2020 CLINICAL DATA:  Short of breath EXAM: PORTABLE CHEST 1 VIEW COMPARISON:  03/05/2020 FINDINGS: Progression of right lower lobe airspace disease. Cardiac silhouette upper normal. Negative for heart failure. Left lung clear. Probable underlying COPD IMPRESSION: COPD.  Progressive right lower lobe infiltrate. Electronically Signed   By: Franchot Gallo M.D.   On: 02/17/2020 16:17   DG Chest Port 1 View  Result Date: 03/05/2020 CLINICAL DATA:  COVID-19. EXAM: PORTABLE CHEST 1 VIEW COMPARISON:  March 03, 2020 FINDINGS: New hazy infiltrates are seen in the lungs, particularly the upper lobes. The cardiomediastinal silhouette is stable. No pneumothorax. No other acute abnormalities. IMPRESSION: New hazy infiltrates, particularly in the upper lobes, worrisome for developing COVID-19 pneumonia given history. Electronically Signed   By: Dorise Bullion III M.D   On: 03/05/2020 08:38   DG Chest Port 1 View  Result Date: 03/03/2020 CLINICAL DATA:  COVID-19 diagnosed 2 weeks ago, increasing shortness of breath, hypoxemia EXAM: PORTABLE CHEST 1 VIEW COMPARISON:  None. FINDINGS: Single frontal view of the chest demonstrates an unremarkable cardiac silhouette. Diffuse interstitial prominence is seen, with faint ground-glass opacities within the mid and lower lung zones. No effusion or pneumothorax. No acute bony abnormality. IMPRESSION: 1. Interstitial prominence with bilateral ground-glass airspace disease.  Differential includes multifocal atypical pneumonia versus edema. Electronically Signed   By: Randa Ngo M.D.   On: 03/03/2020 15:43   DG Chest Port 1V same Day  Result Date: 03/23/2020 CLINICAL DATA:  Shortness of breath, COVID. EXAM: PORTABLE CHEST 1 VIEW COMPARISON:  Multiple priors, most recent 03/22/2020 FINDINGS: Patient rotation. Similar appearance of low lung volumes with diffuse coarse bilateral interstitial thickening. More confluent airspace opacities in lung bases, similar to prior. Similar positioning of a right sided pigtail chest tube, which projects over the lateral right mid lung. Likely small right apical pneumothorax. No visible pleural effusions. Similar w mild enlargement the cardiac silhouette. IMPRESSION: 1. Similar position of a right chest tube. Likely small right apical pneumothorax. 2. Similar multifocal interstitial and airspace opacities, compatible with multifocal pneumonia. Electronically Signed   By: Margaretha Sheffield MD   On: 03/23/2020 08:09   DG Chest Port 1V same Day  Result Date: 03/20/2020 CLINICAL DATA:  COVID-19 positive, shortness of breath. EXAM: PORTABLE CHEST 1 VIEW COMPARISON:  March 19, 2020. FINDINGS: Stable cardiomediastinal silhouette. Stable position of right-sided chest tube with stable minimal right apical pneumothorax. Stable bilateral lung opacities are noted concerning for multifocal pneumonia. No pleural effusion is noted. Bony thorax is unremarkable. IMPRESSION: Stable position of right-sided chest tube with stable minimal right apical pneumothorax. Stable bilateral lung opacities are noted concerning for multifocal pneumonia. Electronically Signed   By: Marijo Conception M.D.   On: 03/20/2020 12:24   DG Chest Port 1V same Day  Result Date: 03/19/2020 CLINICAL DATA:  COVID-19 infection.  Follow-up exam. EXAM: PORTABLE CHEST 1 VIEW COMPARISON:  03/18/2020 and earlier studies. FINDINGS: Tiny right apical pneumothorax has decreased in size from  the  previous exam. Right chest tube is stable, lying along right lateral mid hemithorax. Interstitial and hazy airspace lung opacities are unchanged from the previous day's exam. No new lung abnormalities. IMPRESSION: 1. Right apical pneumothorax has decreased in size, only a tiny pneumothorax persisting at the apex. Stable right chest tube. 2. No change in the bilateral airspace lung opacities consistent with multifocal pneumonia. Electronically Signed   By: Lajean Manes M.D.   On: 03/19/2020 08:46   DG Chest Port 1V same Day  Result Date: 03/17/2020 CLINICAL DATA:  Shortness of breath. EXAM: PORTABLE CHEST 1 VIEW COMPARISON:  Chest CT 03/12/2019.  Chest x-ray 02/20/2020. FINDINGS: Large right pneumothorax is new in the interval. Diffuse interstitial and ground-glass airspace disease in the left lung is progressive since chest x-ray of 03/05/2020. The cardio pericardial silhouette is enlarged. The visualized bony structures of the thorax show no acute abnormality. Telemetry leads overlie the chest. IMPRESSION: New large right pneumothorax. Interval progression of diffuse interstitial and alveolar opacity in the left lung. I personally called the results of this study at 0829 hours to the patient's nurse Greta, who took the report for Dr. Sloan Leiter, who was currently rounding. Electronically Signed   By: Misty Stanley M.D.   On: 03/17/2020 09:07   ECHOCARDIOGRAM LIMITED  Result Date: 03/11/2020    ECHOCARDIOGRAM LIMITED REPORT   Patient Name:   TIWAN SCHNITKER Date of Exam: 03/11/2020 Medical Rec #:  [id number]       Height:       72.0 in Accession #:    [id number]      Weight:       305.7 lb Date of Birth:  [birth year]       BSA:          2.552 m Patient Age:    82 years        BP:           122/68 mmHg Patient Gender: M               HR:           85 bpm. Exam Location:  Inpatient Procedure: Limited Echo, Color Doppler and Cardiac Doppler Indications:   pulmonary hypertension 416.8  History:       Patient has no  prior history of Echocardiogram examinations.                Covid.  Sonographer:   Johny Chess Referring      [id number] Lequita Halt Phys: IMPRESSIONS  1. Left ventricular ejection fraction, by estimation, is 55 to 60%. The left ventricle has normal function. The left ventricle has no regional wall motion abnormalities. Left ventricular diastolic parameters were normal.  2. Right ventricular systolic function is normal. The right ventricular size is mildly enlarged. Tricuspid regurgitation signal is inadequate for assessing PA pressure.  3. The aortic valve is tricuspid. Aortic valve regurgitation is not visualized. Mild aortic valve sclerosis is present, with no evidence of aortic valve stenosis.  4. The inferior vena cava is normal in size with greater than 50% respiratory variability, suggesting right atrial pressure of 3 mmHg. FINDINGS  Left Ventricle: Left ventricular ejection fraction, by estimation, is 55 to 60%. The left ventricle has normal function. The left ventricle has no regional wall motion abnormalities. The left ventricular internal cavity size was normal in size. There is  borderline left ventricular hypertrophy. Right Ventricle: The right ventricular size is mildly enlarged. No increase in right ventricular wall  thickness. Right ventricular systolic function is normal. Tricuspid regurgitation signal is inadequate for assessing PA pressure. Pericardium: There is no evidence of pericardial effusion. Presence of pericardial fat pad. Tricuspid Valve: The tricuspid valve is grossly normal. Tricuspid valve regurgitation is trivial. Aortic Valve: The aortic valve is tricuspid. Aortic valve regurgitation is not visualized. Mild aortic valve sclerosis is present, with no evidence of aortic valve stenosis. Mild aortic valve annular calcification. Pulmonic Valve: The pulmonic valve was grossly normal. Pulmonic valve regurgitation is trivial. Venous: The inferior vena cava is normal in size with greater  than 50% respiratory variability, suggesting right atrial pressure of 3 mmHg. IAS/Shunts: No atrial level shunt detected by color flow Doppler. LEFT VENTRICLE PLAX 2D LVIDd:         5.00 cm  Diastology LVIDs:         3.50 cm  LV e' lateral:   13.90 cm/s LV PW:         1.10 cm  LV E/e' lateral: 5.1 LV IVS:        1.00 cm  LV e' medial:    12.60 cm/s LVOT diam:     2.30 cm  LV E/e' medial:  5.6 LV SV:         85 LV SV Index:   33 LVOT Area:     4.15 cm  IVC IVC diam: 1.90 cm LEFT ATRIUM         Index LA diam:    4.00 cm 1.57 cm/m  AORTIC VALVE LVOT Vmax:   106.00 cm/s LVOT Vmean:  68.700 cm/s LVOT VTI:    0.205 m MITRAL VALVE MV Area (PHT): 4.60 cm    SHUNTS MV Decel Time: 165 msec    Systemic VTI:  0.20 m MV E velocity: 70.30 cm/s  Systemic Diam: 2.30 cm MV A velocity: 90.80 cm/s MV E/A ratio:  0.77 Rozann Lesches MD Electronically signed by Rozann Lesches MD Signature Date/Time: 03/11/2020/4:40:56 PM    Final

## 2020-04-14 NOTE — Progress Notes (Addendum)
RT called to assess pt for respiratory distress. Pt tachypneic, labored upon RT's arrival. SpO2 in the mid 80's on 40L/100% and NRB. Pt requesting that O2 be turned up. HFNC increased to 45L/100%, but pt still complaining of increased SOB, and is in obvious respiratory distress. Pt with increasingly diminished bs on R compared to earlier in the shift. HHFNC then increased to 50L/100%, SpO2 staying in upper 80's. Spoke with RN, who gave pt Morphine for comfort. RT called back to bedside to collect ABG. MD aware of results. RT will continue to monitor.

## 2020-04-14 NOTE — Progress Notes (Signed)
Patient expiration on 09-Apr-2020 at 2040 by this RN and Lottie Mussel., Charge RN.  Family informed of TOD and able to procure funeral home of choice. Family does not want to come at bedside.  [facility] contacted and service number listed in chart.  PIV x2 and right chest tube removed and wasted 50 mL of IV morphine w/ Legrand Como, Agricultural consultant.  Patient care done and pt was transported to morgue.

## 2020-04-14 NOTE — Significant Event (Signed)
HOSPITAL MEDICINE OVERNIGHT EVENT NOTE    Notified by nursing that patient is feeling increasingly short of breath with increasing oxygen requirements - currently on 50liters via heated high flow [place].  Chart reviewed - considering known small pneumothorax S/P chest tube will obtain repeat CXR.  Will also obtain ABG.  Michael Emerald  MD [facility]

## 2020-04-14 NOTE — Significant Event (Addendum)
Contacted PCCM about new chest xray results as follows: IMPRESSION: Increasing right pneumothorax with right chest tube in place. Bilateral pulmonary infiltrates compatible with COVID pneumonia.  Also informed them of increase in patient's O2 needs from 40L/100% FiO2 to 50L/100% FiO2.  Recent ABG:  ABG    Component Value Date/Time   PHART 7.517 (H) 04-03-2020 0345   PCO2ART 35.0 2020-04-03 0345   PO2ART 43.8 (L) April 03, 2020 0345   HCO3 28.1 (H) 03-Apr-2020 0345   O2SAT 78.3 04-03-20 0345     Current VS: BP (!) 134/91 (BP Location: Left Arm)   Pulse (!) 131   Temp 97.6 F (36.4 C) (Axillary)   Resp (!) 30   Ht 6' (1.829 m)   Wt 128.9 kg   SpO2 97%   BMI 38.54 kg/m   Update: PCCM at bedside and assessing chest tube. Order for STAT Chest Xray placed. PCCM team increased suction to -40 cm and flushed approximately 140 mL of normal saline into tubing.

## 2020-04-14 NOTE — Significant Event (Signed)
HOSPITAL MEDICINE OVERNIGHT EVENT NOTE    Called by nursing and notified patient is experiencing worsening shorntess of breath and increasing oxygen requirements, now on 50L of oxygen via heated high flow delivery.  ABG obtained revealing worsening hypoxic respiratory failure with pH of 7.517 and pO2 of 43.8 on 50L of high flow.  Chart reviewed - considering pneumothorax and chest tube placement I ordered a stat repeat CXR which revealed a worsening right pneumothorax.    CCM has been assisting [place] with managing the chest tube/pneuothorax daily.  I have asked nursing to notify CCM immediately concerning this development.  Michael Emerald  MD [facility]

## 2020-04-14 NOTE — Progress Notes (Signed)
eLink Physician-Brief Progress Note Patient Name: Michael Wolf DOB: 08-29-1955 MRN: [id number]   Date of Service  04-04-2020  HPI/Events of Note  Patient with worsening pneumothorax on CXR, declining oxygenation, and increasing oxygen requirement.  eICU Interventions  PCCM bedside crew requested to see this patient ASAP.        Gunner Iodice U Emmanuel Ercole 2020-04-04, 4:30 AM

## 2020-04-14 DEATH — deceased

## 2020-12-25 IMAGING — DX DG CHEST 1V PORT
1 series · 1 of 1 positions shown · non-contrast
Comparison: 03/23/2020

CLINICAL DATA: Acute respiratory failure with hypoxia. COVID
positive. Pneumothorax

EXAM:
PORTABLE CHEST 1 VIEW

[chest ap]
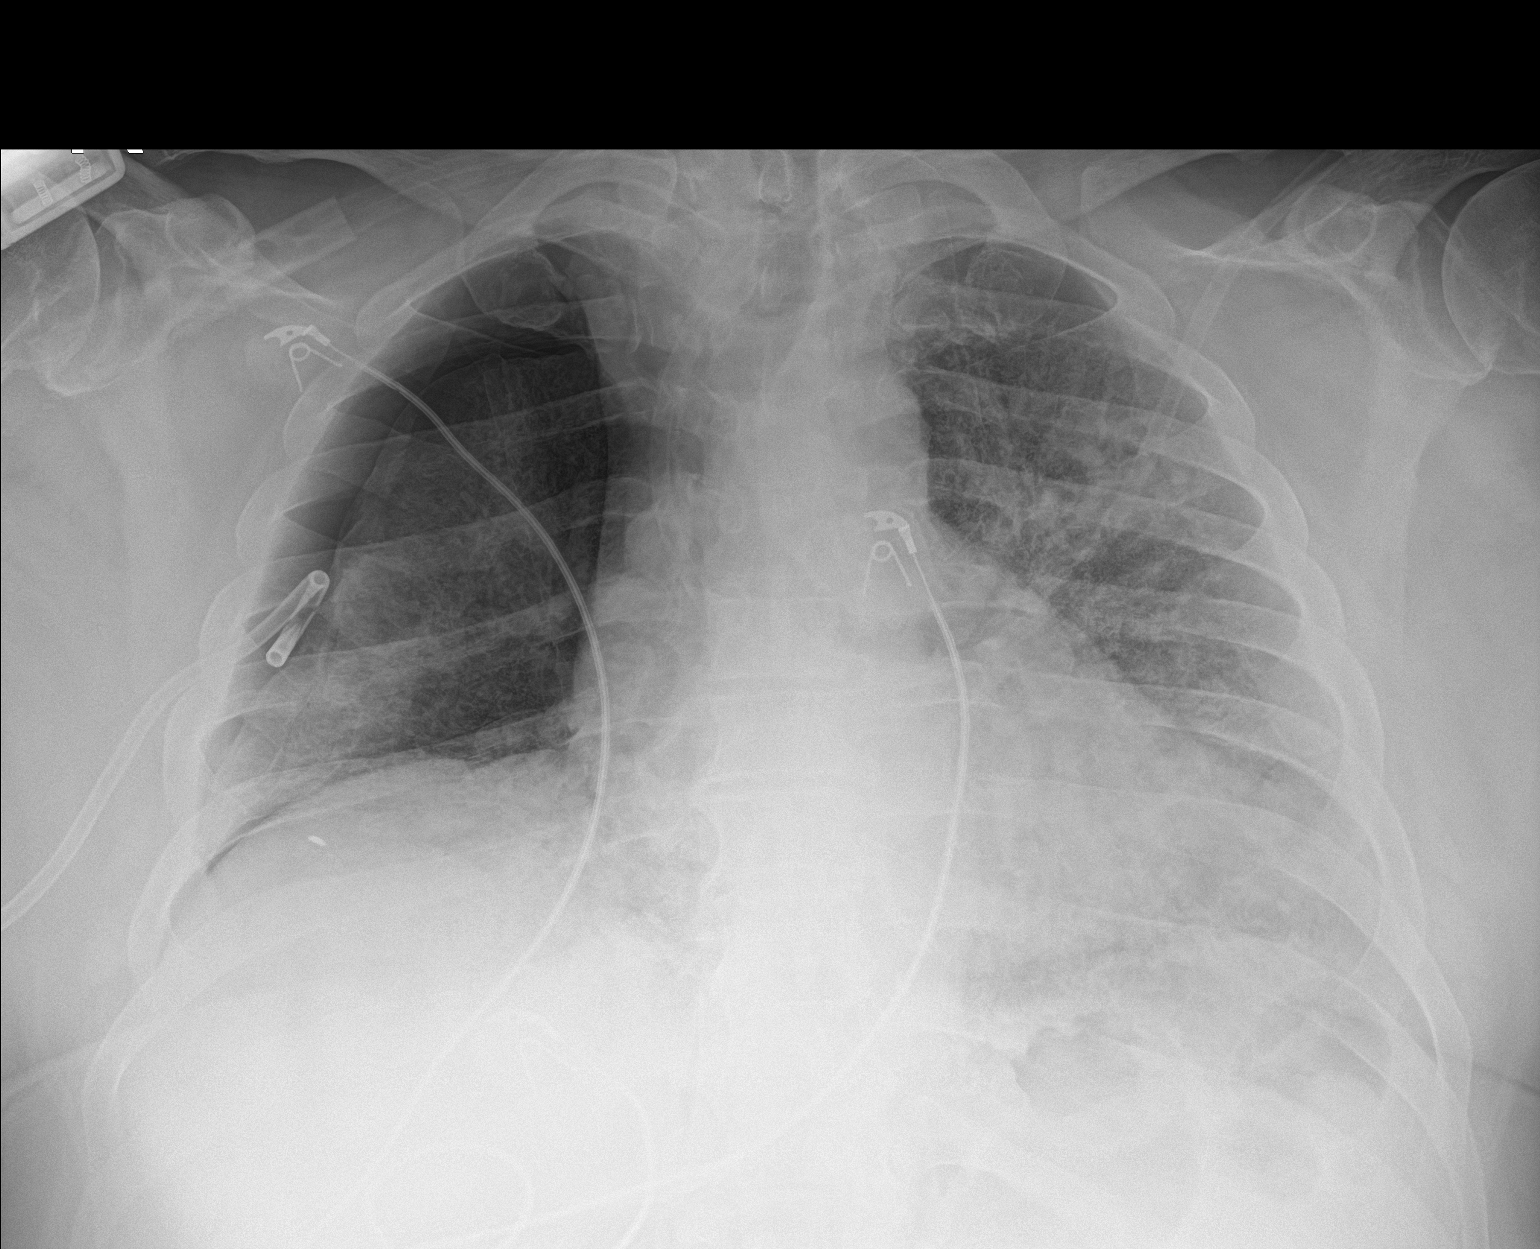

[1 of 1 positions shown; findings below may reference images not displayed]

FINDINGS: Cardiac enlargement. Increasing right pneumothorax with right chest
tube in place. Atelectasis in the right lung. Bilateral pulmonary
infiltrates compatible with COVID pneumonia. No pleural effusions.
IMPRESSION: Increasing right pneumothorax with right chest tube in place.
Bilateral pulmonary infiltrates compatible with COVID pneumonia.

## 2020-12-25 IMAGING — DX DG CHEST 1V PORT
1 series · 1 of 1 positions shown · non-contrast
Comparison: 03/24/2020.

CLINICAL DATA: Pneumothorax follow-up.  COVID.

EXAM:
PORTABLE CHEST 1 VIEW

[chest]
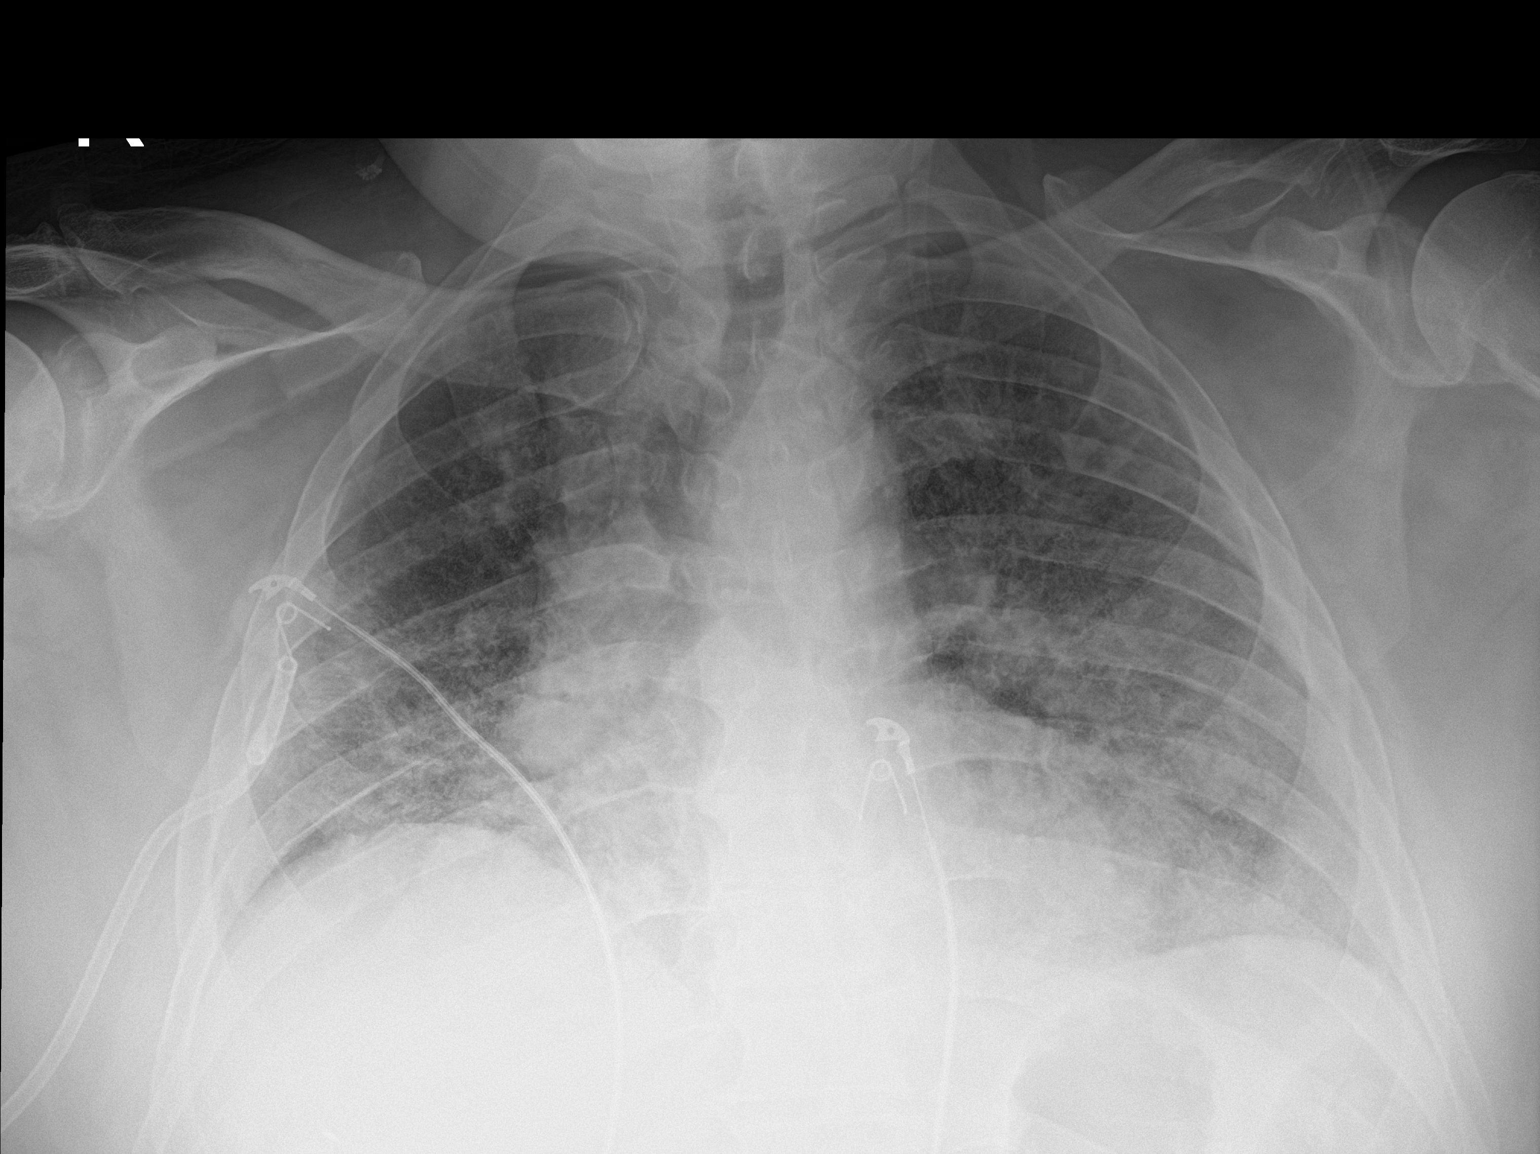

[1 of 1 positions shown; findings below may reference images not displayed]

FINDINGS: Right chest tube in stable position. Interim significant improvement
of right-sided pneumothorax. Tiny right apical residual may be
present. Cardiomegaly. No pulmonary venous congestion. Diffuse
bilateral pulmonary infiltrates/edema again noted. No pleural
effusion. Degenerative change thoracic spine.
IMPRESSION: 1. Right chest tube in stable position. Interim significant
improvement right-sided pneumothorax. Tiny right apical residual may
be present.

2. Diffuse bilateral pulmonary infiltrates/edema again noted without
interim change.

3.  Cardiomegaly.  No pulmonary venous congestion.
